# Patient Record
Sex: Female | Born: 1985 | Race: Black or African American | Hispanic: No | Marital: Single | Smoking: Never smoker
Health system: Southern US, Community
[De-identification: ages and names within clinical notes are randomized; demographics above are authoritative.]

## PROBLEM LIST (undated history)

## (undated) DIAGNOSIS — J45909 Unspecified asthma, uncomplicated: Secondary | ICD-10-CM

## (undated) DIAGNOSIS — I639 Cerebral infarction, unspecified: Secondary | ICD-10-CM

## (undated) DIAGNOSIS — D571 Sickle-cell disease without crisis: Secondary | ICD-10-CM

## (undated) HISTORY — PX: CHOLECYSTECTOMY: SHX55

## (undated) HISTORY — DX: Unspecified asthma, uncomplicated: J45.909

---

## 2013-01-24 ENCOUNTER — Encounter (HOSPITAL_COMMUNITY): Payer: Self-pay | Admitting: Hematology

## 2013-02-02 ENCOUNTER — Encounter (HOSPITAL_COMMUNITY): Payer: Self-pay | Admitting: Hematology

## 2013-02-02 ENCOUNTER — Telehealth (HOSPITAL_COMMUNITY): Payer: Self-pay | Admitting: Hematology

## 2013-02-02 ENCOUNTER — Non-Acute Institutional Stay (HOSPITAL_COMMUNITY)
Admission: AD | Admit: 2013-02-02 | Discharge: 2013-02-03 | Disposition: A | Discharge status: Home / Self Care | Payer: Medicaid Other | Source: Ambulatory Visit | Attending: Internal Medicine | Admitting: Internal Medicine

## 2013-02-02 DIAGNOSIS — I69998 Other sequelae following unspecified cerebrovascular disease: Secondary | ICD-10-CM | POA: Insufficient documentation

## 2013-02-02 DIAGNOSIS — D5700 Hb-SS disease with crisis, unspecified: Secondary | ICD-10-CM | POA: Insufficient documentation

## 2013-02-02 DIAGNOSIS — Z79899 Other long term (current) drug therapy: Secondary | ICD-10-CM | POA: Insufficient documentation

## 2013-02-02 DIAGNOSIS — H539 Unspecified visual disturbance: Secondary | ICD-10-CM | POA: Insufficient documentation

## 2013-02-02 DIAGNOSIS — M2550 Pain in unspecified joint: Secondary | ICD-10-CM | POA: Insufficient documentation

## 2013-02-02 HISTORY — DX: Cerebral infarction, unspecified: I63.9

## 2013-02-02 HISTORY — DX: Sickle-cell disease without crisis: D57.1

## 2013-02-02 LAB — COMPREHENSIVE METABOLIC PANEL
ALT: 83 U/L — ABNORMAL HIGH (ref 0–35)
AST: 110 U/L — ABNORMAL HIGH (ref 0–37)
Albumin: 3.6 g/dL (ref 3.5–5.2)
Alkaline Phosphatase: 396 U/L — ABNORMAL HIGH (ref 39–117)
Calcium: 9.2 mg/dL (ref 8.4–10.5)
Glucose, Bld: 95 mg/dL (ref 70–99)
Potassium: 3.6 mEq/L (ref 3.5–5.1)
Sodium: 139 mEq/L (ref 135–145)
Total Protein: 8.6 g/dL — ABNORMAL HIGH (ref 6.0–8.3)

## 2013-02-02 LAB — RETICULOCYTES: Retic Count, Absolute: 272.9 10*3/uL — ABNORMAL HIGH (ref 19.0–186.0)

## 2013-02-02 LAB — CBC WITH DIFFERENTIAL/PLATELET
Basophils Absolute: 0.1 10*3/uL (ref 0.0–0.1)
Basophils Relative: 2 % — ABNORMAL HIGH (ref 0–1)
Eosinophils Absolute: 0.1 10*3/uL (ref 0.0–0.7)
Hemoglobin: 12.1 g/dL (ref 12.0–15.0)
MCHC: 37.8 g/dL — ABNORMAL HIGH (ref 30.0–36.0)
Monocytes Relative: 14 % — ABNORMAL HIGH (ref 3–12)
Neutro Abs: 1.6 10*3/uL — ABNORMAL LOW (ref 1.7–7.7)
Neutrophils Relative %: 45 % (ref 43–77)
Platelets: 257 10*3/uL (ref 150–400)

## 2013-02-02 LAB — FERRITIN: Ferritin: 1178 ng/mL — ABNORMAL HIGH (ref 10–291)

## 2013-02-02 MED ORDER — SENNOSIDES-DOCUSATE SODIUM 8.6-50 MG PO TABS
1.0000 | ORAL_TABLET | Freq: Two times a day (BID) | ORAL | Status: DC
  Administered 2013-02-02: 1 via ORAL
  Filled 2013-02-02: qty 1

## 2013-02-02 MED ORDER — HYDROMORPHONE HCL PF 2 MG/ML IJ SOLN
1.0000 mg | INTRAMUSCULAR | Status: DC | PRN
  Administered 2013-02-02: 1 mg via INTRAVENOUS
  Filled 2013-02-02: qty 1

## 2013-02-02 MED ORDER — FOLIC ACID 1 MG PO TABS: 1.0000 mg | ORAL_TABLET | Freq: Every day | ORAL | Status: DC

## 2013-02-02 MED ORDER — DEXTROSE-NACL 5-0.45 % IV SOLN
INTRAVENOUS | Status: DC
  Administered 2013-02-02 (×2): via INTRAVENOUS

## 2013-02-02 MED ORDER — HYDROXYUREA 500 MG PO CAPS: 500.0000 mg | ORAL_CAPSULE | Freq: Two times a day (BID) | ORAL | Status: DC

## 2013-02-02 MED ORDER — VITAMIN D3 25 MCG (1000 UNIT) PO TABS
1000.0000 [IU] | ORAL_TABLET | Freq: Every day | ORAL | Status: DC
  Administered 2013-02-02: 1000 [IU] via ORAL
  Filled 2013-02-02 (×2): qty 1

## 2013-02-02 MED ORDER — ZOLPIDEM TARTRATE 5 MG PO TABS: 5.0000 mg | ORAL_TABLET | Freq: Every evening | ORAL | Status: DC | PRN

## 2013-02-02 MED ORDER — ONDANSETRON HCL 4 MG/2ML IJ SOLN
4.0000 mg | INTRAMUSCULAR | Status: DC | PRN
  Administered 2013-02-02: 4 mg via INTRAVENOUS
  Filled 2013-02-02: qty 2

## 2013-02-02 MED ORDER — ONDANSETRON HCL 8 MG PO TABS
4.0000 mg | ORAL_TABLET | ORAL | Status: DC | PRN
  Administered 2013-02-02 – 2013-02-03 (×2): 4 mg via ORAL
  Filled 2013-02-02 (×2): qty 1

## 2013-02-02 MED ORDER — DIPHENHYDRAMINE HCL 25 MG PO CAPS
25.0000 mg | ORAL_CAPSULE | ORAL | Status: DC | PRN
  Administered 2013-02-02: 25 mg via ORAL
  Filled 2013-02-02: qty 1

## 2013-02-02 MED ORDER — KETOROLAC TROMETHAMINE 30 MG/ML IJ SOLN
30.0000 mg | Freq: Four times a day (QID) | INTRAMUSCULAR | Status: DC
  Administered 2013-02-02 – 2013-02-03 (×3): 30 mg via INTRAVENOUS
  Filled 2013-02-02 (×3): qty 1

## 2013-02-02 MED ORDER — ONDANSETRON HCL 4 MG/2ML IJ SOLN: 2.0000 mg | Freq: Once | INTRAMUSCULAR | Status: DC

## 2013-02-02 MED ORDER — ONDANSETRON HCL 4 MG/2ML IJ SOLN
4.0000 mg | Freq: Once | INTRAMUSCULAR | Status: AC
  Administered 2013-02-02: 4 mg via INTRAVENOUS
  Filled 2013-02-02: qty 2

## 2013-02-02 MED ORDER — DIPHENHYDRAMINE HCL 50 MG/ML IJ SOLN
12.5000 mg | INTRAMUSCULAR | Status: DC | PRN
  Administered 2013-02-02 (×2): 12.5 mg via INTRAVENOUS
  Filled 2013-02-02 (×2): qty 1

## 2013-02-02 MED ORDER — HYDROMORPHONE HCL PF 2 MG/ML IJ SOLN
1.0000 mg | INTRAMUSCULAR | Status: DC
  Administered 2013-02-02 (×2): 1 mg via INTRAVENOUS
  Filled 2013-02-02 (×2): qty 1

## 2013-02-02 MED ORDER — DIPHENHYDRAMINE HCL 50 MG/ML IJ SOLN
12.5000 mg | Freq: Once | INTRAMUSCULAR | Status: AC
  Administered 2013-02-02: 12.5 mg via INTRAVENOUS
  Filled 2013-02-02: qty 1

## 2013-02-02 NOTE — H&P (Signed)
[facility] History and Physical   Date: 02/02/2013  Patient name: Joan Martin Medical record number: [id number] Date of birth: 04-05-1986 Age: 27 y.o. Gender: female PCP: MATTHEWS,Philisha Weinel A., MD  Attending physician: Altha Harm, MD  Chief Complaint:weakness and low back pain   History of Present Illness: Joan Martin  is a 27 y.o. female with history of genotype SS sickle cell disease and a PMH of a stroke at the age of 22 with vision affected no other functional decline.  She reports experiencing weakness and low back pain since Saturday and not relieved by current pain medication.  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 She denies any fevers, chills, dysuria, difficulty breathing, cough, or shortness of breath. Pain rated as 4/10 and baseline is pain free.   Meds: Prescriptions prior to admission  Medication Sig Dispense Refill  . cholecalciferol (VITAMIN D) 1000 UNITS tablet Take 1,000 Units by mouth daily.      . diphenhydrAMINE (BENADRYL) 25 MG tablet Take 25 mg by mouth every 6 (six) hours as needed for itching.      . Fluticasone-Salmeterol (ADVAIR) 250-50 MCG/DOSE AEPB Inhale 1 puff into the lungs daily.      . folic acid (FOLVITE) 1 MG tablet Take 1 mg by mouth daily.      . hydroxyurea (HYDREA) 500 MG capsule Take 500 mg by mouth 2 (two) times daily. May take with food to minimize GI side effects.      . mometasone (NASONEX) 50 MCG/ACT nasal spray Place 2 sprays into the nose daily.      Marland Kitchen oxyCODONE-acetaminophen (PERCOCET) 10-325 MG per tablet Take 1 tablet by mouth every 6 (six) hours as needed for pain.      . promethazine (PHENERGAN) 25 MG tablet Take 25 mg by mouth every 6 (six) hours as needed for nausea.        Allergies: Morphine and related Past Medical History  Diagnosis Date  . Sickle cell  anemia   . Stroke     7 years ago   Past Surgical History  Procedure Laterality Date  . Cholecystectomy     No family history on file. History   Social History  . Marital Status: Single    Spouse Name: N/A    Number of Children: N/A  . Years of Education: N/A   Occupational History  . Not on file.   Social History Main Topics  . Smoking status: Never Smoker   . Smokeless tobacco: Not on file  . Alcohol Use: No  . Drug Use: No  . Sexually Active: Yes    Birth Control/ Protection: Condom   Other Topics Concern  . Not on file   Social History Narrative  . No narrative on file    Review of Systems: Musculoskeletal:positive for back pain and weakness    Physical Exam: Blood pressure 133/77, pulse 80, temperature 98.2 F (36.8 C), temperature source Oral, resp. rate 18, height 5\' 7"  (1.702 m), weight 60.328 kg (133 lb), last menstrual period 01/12/2013, SpO2 100.00%. BP 116/70  Pulse 81  Temp(Src) 98.2 F (36.8 C) (Oral)  Resp 18  Ht 5\' 7"  (1.702 m)  Wt 60.328 kg (133 lb)  BMI 20.83 kg/m2  SpO2 95%  LMP 01/12/2013  General Appearance:    Alert, cooperative, no distress, appears stated age  Head:  Normocephalic, without obvious abnormality, atraumatic  Eyes:    PERRL, conjunctiva/corneas clear, EOM's intact, fundi    benign, both eyes  Ears:    Normal TM's and external ear canals, both ears  Nose:   Nares normal, septum midline, mucosa normal, no drainage    or sinus tenderness  Throat:   Lips, mucosa, and tongue normal; teeth and gums normal  Neck:   Supple, symmetrical, trachea midline, no adenopathy;    thyroid:  no enlargement/tenderness/nodules; no carotid   bruit or JVD  Back:     Symmetric, no curvature, ROM normal, no CVA tenderness  Lungs:     Clear to auscultation bilaterally, respirations unlabored  Chest Wall:    No tenderness or deformity   Heart:    Regular rate and rhythm, S1 and S2 normal, no murmur, rub   or gallop     Abdomen:     Soft,  non-tender, bowel sounds active all four quadrants,    no masses, no organomegaly        Extremities:   Extremities normal, atraumatic, no cyanosis or edema  Pulses:   2+ and symmetric all extremities  Skin:   Skin color, texture, turgor normal, no rashes or lesions, 1 tattoo on right foot   Lymph nodes:   Cervical, supraclavicular, and axillary nodes normal  Neurologic:   CNII-XII intact, normal strength, sensation and reflexes    throughout    Lab results: Results for orders placed during the hospital encounter of 02/02/13 (from the past 24 hour(s))  CBC WITH DIFFERENTIAL     Status: Abnormal   Collection Time    02/02/13 10:33 AM      Result Value Range   WBC 3.6 (*) 4.0 - 10.5 K/uL   RBC 2.55 (*) 3.87 - 5.11 MIL/uL   Hemoglobin 12.1  12.0 - 15.0 g/dL   HCT 16.1 (*) 09.6 - 04.5 %   MCV 125.5 (*) 78.0 - 100.0 fL   MCH 47.5 (*) 26.0 - 34.0 pg   MCHC 37.8 (*) 30.0 - 36.0 g/dL   RDW 40.9  81.1 - 91.4 %   Platelets 257  150 - 400 K/uL   Neutrophils Relative 45  43 - 77 %   Neutro Abs 1.6 (*) 1.7 - 7.7 K/uL   Lymphocytes Relative 38  12 - 46 %   Lymphs Abs 1.3  0.7 - 4.0 K/uL   Monocytes Relative 14 (*) 3 - 12 %   Monocytes Absolute 0.5  0.1 - 1.0 K/uL   Eosinophils Relative 2  0 - 5 %   Eosinophils Absolute 0.1  0.0 - 0.7 K/uL   Basophils Relative 2 (*) 0 - 1 %   Basophils Absolute 0.1  0.0 - 0.1 K/uL   WBC Morphology SICKLE CELLS    COMPREHENSIVE METABOLIC PANEL     Status: Abnormal   Collection Time    02/02/13 10:33 AM      Result Value Range   Sodium 139  135 - 145 mEq/L   Potassium 3.6  3.5 - 5.1 mEq/L   Chloride 106  96 - 112 mEq/L   CO2 20  19 - 32 mEq/L   Glucose, Bld 95  70 - 99 mg/dL   BUN 9  6 - 23 mg/dL   Creatinine, Ser 7.82  0.50 - 1.10 mg/dL   Calcium 9.2  8.4 - 95.6 mg/dL   Total Protein 8.6 (*) 6.0 - 8.3 g/dL   Albumin 3.6  3.5 - 5.2  g/dL   AST 960 (*) 0 - 37 U/L   ALT 83 (*) 0 - 35 U/L   Alkaline Phosphatase 396 (*) 39 - 117 U/L   Total Bilirubin  6.4 (*) 0.3 - 1.2 mg/dL   GFR calc non Af Amer >90  >90 mL/min   GFR calc Af Amer >90  >90 mL/min  RETICULOCYTES     Status: Abnormal   Collection Time    02/02/13 10:33 AM      Result Value Range   Retic Ct Pct 10.7 (*) 0.4 - 3.1 %   RBC. 2.55 (*) 3.87 - 5.11 MIL/uL   Retic Count, Manual 272.9 (*) 19.0 - 186.0 K/uL    Imaging results:  No results found.   Assessment & Plan:  Vaso occlusive crisis/ Pain : Chronic likely caused by hyperviscosity, sticky blood vessel and decreased oxygen carrying capacity. Treatment /plan is hydromorphone 1-2 mg Q  prn.Adjunction added Toradol 30mg  Q 6hrs for inflammatory/reactive process associated with crisis.  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 Monitor.   Laterrian Hevener P 02/02/2013, 12:31 PM

## 2013-02-02 NOTE — Telephone Encounter (Signed)
Patient called C/O "feeling sluggish and fatigued."  Denies any pain at this time.  Per patient "this is her usual feeling prior to going into crisis".  Patient is wanting to come into [facility] for IV hydration.  Advised I would discuss with NP and give her a call back.

## 2013-02-02 NOTE — Telephone Encounter (Signed)
Called patient back and advised that I had spoke with Marcelino Duster, NP, and it is ok for her to come to the [facility]  Patient verbalizes understanding

## 2013-02-02 NOTE — Progress Notes (Signed)
  Pharmacy Note - Hold Hydrea  Pt's home med, hydrea 500mg  PO BID, was reordered; however patient meets hold criteria set forth by P&T. This order will be d/c'd.   Hydroxyurea (Hydrea) hold criteria  ANC < 2  Pltc < 80K in sickle-cell patients; < 100K in other patients  Hgb <= 6 in sickle-cell patients; < 8 in other patients  Reticulocytes < 80K when Hgb < 9  Suggest rechecking these labs again to make sure hold criteria no longer met.  Darrol Angel, PharmD Pager: [phone] 02/02/2013 11:29 AM

## 2013-02-02 NOTE — Telephone Encounter (Signed)
Patient called C/O of back hurting since Saturday.  Pain has been as high as 7/10, does go dow to 4/10 after oral pain medication, but still is nagging and unbearable.  Patient also C/O of nausea, which according to patient, she has "taken something similar to phenergan, which has helped."  Advised I would discuss with the NP and give her a call back.

## 2013-02-03 MED ORDER — ONDANSETRON HCL 4 MG PO TABS: 4.0000 mg | ORAL_TABLET | Freq: Three times a day (TID) | ORAL | Status: DC | PRN

## 2013-02-03 MED ORDER — OXYCODONE HCL 5 MG PO TABS
5.0000 mg | ORAL_TABLET | Freq: Once | ORAL | Status: AC
  Administered 2013-02-03: 5 mg via ORAL
  Filled 2013-02-03: qty 1

## 2013-02-03 MED ORDER — OXYCODONE-ACETAMINOPHEN 5-325 MG PO TABS
1.0000 | ORAL_TABLET | Freq: Once | ORAL | Status: AC
  Administered 2013-02-03: 1 via ORAL
  Filled 2013-02-03: qty 1

## 2013-02-03 MED ORDER — OMEPRAZOLE 20 MG PO CPDR: 20.0000 mg | DELAYED_RELEASE_CAPSULE | Freq: Every day | ORAL | Status: DC

## 2013-02-03 MED ORDER — TRAMADOL HCL 50 MG PO TABS: 50.0000 mg | ORAL_TABLET | Freq: Three times a day (TID) | ORAL | Status: DC | PRN

## 2013-02-03 MED ORDER — OXYCODONE-ACETAMINOPHEN 10-325 MG PO TABS: 1.0000 | ORAL_TABLET | Freq: Four times a day (QID) | ORAL | Status: DC | PRN

## 2013-02-03 NOTE — Progress Notes (Signed)
Patient discharged to home. VSS. Patient in no apparent distress at this time. Discharged instructions reviewed and given, patient verbalizes understanding. Prescriptions given and called in additional prescriptions, patient verbalizes understanding. Patient exits day hospital with belongings, ambulatory. Patient verbalizes to return next Wednesday for lab draw and appointment made upon discharge for f/u visit with MD.

## 2013-02-03 NOTE — Discharge Instructions (Signed)
Sickle Cell Pain Crisis Sickle cell crisis happens when some of the red blood cells that are the wrong shape (sickle shaped instead of round) get stuck in your small blood vessels. This blocks blood flow through these vessels. HOME CARE To prevent a sickle cell crisis:  Adults should drink at least 8 glasses of liquid every day. For children, this amount should be less.  Do not drink beer, wine, or hard liquor.  Take your medicines as told by your doctor.  Keep all appointments with your doctor.  Find ways to lessen any stress you may have.  Get 8 hours of sleep every night.  Dress warmly in cold weather.  Do not go to places that are high up in the mountains.  If you cut or scrape yourself, keep your wound clean. Cover it with a bandage.  Exercise as your doctor tells you. Do not exercise so hard or so long that you sweat. GET HELP RIGHT AWAY IF:  You have bad pain.  You have a bad headache.  You have trouble breathing.  You have chest pain.  You feel weak or very tired.  You start to throw up (vomit).  You have watery poop (diarrhea).  You have a temperature by mouth above 102 F (38.9 C), not controlled by medicine.  Your baby is older than 3 months with a rectal temperature of 102 F (38.9 C) or higher.  Your baby is 33 months old or younger with a rectal temperature of 100.4 F (38 C) or higher. MAKE SURE YOU:   Understand these instructions.  Will watch this condition.  Will get help right away if you are not doing well or get worse. Document Released: 03/26/2010 Document Revised: 12/29/2011 Document Reviewed: 03/26/2010 [facility] Patient Information 2013 [place], [place].

## 2013-02-03 NOTE — Discharge Summary (Signed)
[facility] Discharge Summary   Patient ID: Joan Martin MRN: [id number] DOB/AGE: February 16, 1986 26 y.o.  Admit date: 02/02/2013 Discharge date: 02/03/2013  Primary Care Physician:  MATTHEWS,Felipe Paluch A., MD  Admission Diagnoses:  Active Problems:  Sickle cell anemia stroke   Discharge Diagnoses:    Sickle cell anemia stroke  Discharge Medications:    Medication List    ASK your doctor about these medications       cholecalciferol 1000 UNITS tablet  Commonly known as:  VITAMIN D  Take 1,000 Units by mouth daily.     diphenhydrAMINE 25 MG tablet  Commonly known as:  BENADRYL  Take 25 mg by mouth every 6 (six) hours as needed for itching.     Fluticasone-Salmeterol 250-50 MCG/DOSE Aepb  Commonly known as:  ADVAIR  Inhale 1 puff into the lungs daily.     folic acid 1 MG tablet  Commonly known as:  FOLVITE  Take 1 mg by mouth daily.     hydroxyurea 500 MG capsule  Commonly known as:  HYDREA  Take 500 mg by mouth 2 (two) times daily. May take with food to minimize GI side effects.     mometasone 50 MCG/ACT nasal spray  Commonly known as:  NASONEX  Place 2 sprays into the nose daily.     oxyCODONE-acetaminophen 10-325 MG per tablet  Commonly known as:  PERCOCET  Take 1 tablet by mouth every 6 (six) hours as needed for pain.     promethazine 25 MG tablet  Commonly known as:  PHENERGAN  Take 25 mg by mouth every 6 (six) hours as needed for nausea.         Consults: None    Significant Diagnostic Studies:  No results found.   [facility] Course:  For complete details please refer to admission H and Martin, but in brief, [name] is a 27 y.o. female with history of genotype SS sickle cell disease and a PMH of a stroke at the age of 73 with vision affected no other functional decline. She reports experiencing weakness and low back pain since Saturday and not relieved by current pain medication. Pain rated as 2/10 and  baseline is pain free. Treatment course included IV for hydration, hydromorphone and Toradol for managing VOC pain. Hydroxurea on hold from ANC  < 2 will repeat labs in 1 week. Zofran given IV x'1 than po from nausea associated with analgesic.  VOC uncomplicated.   Physical Exam at Discharge:  BP 105/61  Pulse 72  Temp(Src) 98.7 F (37.1 C) (Oral)  Resp 16  Ht 5\' 7"  (1.702 m)  Wt 60.328 kg (133 lb)  BMI 20.83 kg/m2  SpO2 98%  LMP 01/12/2013  Gen: Alert, cooperative, no distress, appears stated age  Cardiovascular: Regular rate and rhythm, S1 and S2 normal, no murmur, rub or gallop  Respiratory: Clear to auscultation bilaterally, respirations unlabored  Gastrointestinal:Soft, non-tender, bowel sounds active all four quadrants,  no masses, no organomegaly  Extremities:Extremities normal, atraumatic, no cyanosis or edema Pulses: 2+ and symmetric all extremities    Disposition at Discharge:  Home  Self care  Discharge Orders: Return for labs x's 1 week then follow up with establishing care.  Prescription given for Percocet and added tramadol both prn this was d/w Dr. Ashley Royalty and agreed upon   Hold hydrourea until seen.  RN called in prescriptions for Omeprazole 20mg  and Zofran 4mg  prn  Condition at Discharge:   Stable  Time spent  on Discharge:  Greater than 30 minutes.  Signed: Ica Daye Martin 02/03/2013, 9:04 AM

## 2013-02-10 NOTE — Discharge Summary (Signed)
Pt seen and examined and discussed with NP Gwinda Passe. Pt found to have a low ANC and on Hydrea. She has been advised to hold Hydrea and return to clinic forCBC with diff on 02/10/2013. Agree with assessment and plan.

## 2013-02-10 NOTE — H&P (Signed)
Pt seen and examined and discussed with NP Michelle Edwards. Agree with assessment and plan. 

## 2013-02-14 ENCOUNTER — Non-Acute Institutional Stay (HOSPITAL_COMMUNITY)
Admission: AD | Admit: 2013-02-14 | Discharge: 2013-02-15 | Disposition: A | Discharge status: Home / Self Care | Payer: Medicaid Other | Source: Ambulatory Visit | Attending: Internal Medicine | Admitting: Internal Medicine

## 2013-02-14 ENCOUNTER — Encounter (HOSPITAL_COMMUNITY): Payer: Self-pay | Admitting: Hematology

## 2013-02-14 DIAGNOSIS — D5700 Hb-SS disease with crisis, unspecified: Secondary | ICD-10-CM | POA: Insufficient documentation

## 2013-02-14 DIAGNOSIS — Z8673 Personal history of transient ischemic attack (TIA), and cerebral infarction without residual deficits: Secondary | ICD-10-CM | POA: Insufficient documentation

## 2013-02-14 DIAGNOSIS — D57819 Other sickle-cell disorders with crisis, unspecified: Secondary | ICD-10-CM

## 2013-02-14 DIAGNOSIS — R52 Pain, unspecified: Secondary | ICD-10-CM | POA: Insufficient documentation

## 2013-02-14 DIAGNOSIS — Z79899 Other long term (current) drug therapy: Secondary | ICD-10-CM | POA: Insufficient documentation

## 2013-02-14 DIAGNOSIS — Z9109 Other allergy status, other than to drugs and biological substances: Secondary | ICD-10-CM | POA: Insufficient documentation

## 2013-02-14 DIAGNOSIS — Z885 Allergy status to narcotic agent status: Secondary | ICD-10-CM | POA: Insufficient documentation

## 2013-02-14 LAB — CBC WITH DIFFERENTIAL/PLATELET
Basophils Absolute: 0.2 10*3/uL — ABNORMAL HIGH (ref 0.0–0.1)
HCT: 32.5 % — ABNORMAL LOW (ref 36.0–46.0)
Lymphocytes Relative: 40 % (ref 12–46)
Monocytes Relative: 15 % — ABNORMAL HIGH (ref 3–12)
Neutrophils Relative %: 39 % — ABNORMAL LOW (ref 43–77)
Platelets: 393 10*3/uL (ref 150–400)
RDW: 16.1 % — ABNORMAL HIGH (ref 11.5–15.5)
WBC: 6.4 10*3/uL (ref 4.0–10.5)

## 2013-02-14 LAB — RETICULOCYTES
RBC.: 2.35 MIL/uL — ABNORMAL LOW (ref 3.87–5.11)
Retic Count, Absolute: 329 10*3/uL — ABNORMAL HIGH (ref 19.0–186.0)
Retic Ct Pct: 14 % — ABNORMAL HIGH (ref 0.4–3.1)

## 2013-02-14 LAB — PREPARE RBC (CROSSMATCH)

## 2013-02-14 LAB — ABO/RH: ABO/RH(D): O POS

## 2013-02-14 MED ORDER — FLUTICASONE PROPIONATE 50 MCG/ACT NA SUSP
1.0000 | Freq: Every day | NASAL | Status: DC
  Filled 2013-02-14: qty 16

## 2013-02-14 MED ORDER — VITAMIN D3 25 MCG (1000 UNIT) PO TABS
1000.0000 [IU] | ORAL_TABLET | Freq: Every day | ORAL | Status: DC
  Administered 2013-02-14: 1000 [IU] via ORAL
  Filled 2013-02-14 (×2): qty 1

## 2013-02-14 MED ORDER — PROMETHAZINE HCL 25 MG PO TABS: 25.0000 mg | ORAL_TABLET | Freq: Four times a day (QID) | ORAL | Status: DC | PRN

## 2013-02-14 MED ORDER — HYDROMORPHONE HCL PF 2 MG/ML IJ SOLN
2.0000 mg | INTRAMUSCULAR | Status: DC | PRN
  Administered 2013-02-14 (×2): 2 mg via INTRAVENOUS
  Filled 2013-02-14 (×2): qty 1

## 2013-02-14 MED ORDER — FOLIC ACID 1 MG PO TABS: 1.0000 mg | ORAL_TABLET | Freq: Every day | ORAL | Status: DC

## 2013-02-14 MED ORDER — MOMETASONE FURO-FORMOTEROL FUM 100-5 MCG/ACT IN AERO
2.0000 | INHALATION_SPRAY | Freq: Two times a day (BID) | RESPIRATORY_TRACT | Status: DC
  Administered 2013-02-14: 2 via RESPIRATORY_TRACT
  Filled 2013-02-14: qty 8.8

## 2013-02-14 MED ORDER — DIPHENHYDRAMINE HCL 25 MG PO CAPS
25.0000 mg | ORAL_CAPSULE | ORAL | Status: DC | PRN
  Administered 2013-02-15: 25 mg via ORAL
  Filled 2013-02-14: qty 1

## 2013-02-14 MED ORDER — PANTOPRAZOLE SODIUM 40 MG PO TBEC
40.0000 mg | DELAYED_RELEASE_TABLET | Freq: Every day | ORAL | Status: DC
  Administered 2013-02-14: 40 mg via ORAL
  Filled 2013-02-14: qty 1

## 2013-02-14 MED ORDER — SENNOSIDES-DOCUSATE SODIUM 8.6-50 MG PO TABS
1.0000 | ORAL_TABLET | Freq: Two times a day (BID) | ORAL | Status: DC
  Administered 2013-02-14 (×2): 1 via ORAL
  Filled 2013-02-14 (×2): qty 1

## 2013-02-14 MED ORDER — ONDANSETRON HCL 4 MG/2ML IJ SOLN
4.0000 mg | INTRAMUSCULAR | Status: DC | PRN
  Administered 2013-02-14: 4 mg via INTRAVENOUS
  Filled 2013-02-14: qty 2

## 2013-02-14 MED ORDER — ONDANSETRON HCL 8 MG PO TABS: 4.0000 mg | ORAL_TABLET | ORAL | Status: DC | PRN

## 2013-02-14 MED ORDER — KETOROLAC TROMETHAMINE 30 MG/ML IJ SOLN
30.0000 mg | Freq: Four times a day (QID) | INTRAMUSCULAR | Status: DC
  Administered 2013-02-14 – 2013-02-15 (×3): 30 mg via INTRAVENOUS
  Filled 2013-02-14 (×3): qty 1

## 2013-02-14 MED ORDER — DEXTROSE-NACL 5-0.45 % IV SOLN
INTRAVENOUS | Status: DC
  Administered 2013-02-14: 100 mL/h via INTRAVENOUS
  Administered 2013-02-15: via INTRAVENOUS

## 2013-02-14 MED ORDER — SODIUM CHLORIDE 0.9 % IV SOLN
25.0000 mg | INTRAVENOUS | Status: DC | PRN
  Administered 2013-02-14 (×2): 25 mg via INTRAVENOUS
  Filled 2013-02-14 (×2): qty 0.5

## 2013-02-14 NOTE — Progress Notes (Signed)
IV team paged for midline IV access

## 2013-02-14 NOTE — Progress Notes (Signed)
Received report from day nurse Tiffany, RN.

## 2013-02-14 NOTE — H&P (Signed)
Hospital Admission Note Date: 02/14/2013  Patient name: Joan Martin Medical record number: [id number] Date of birth: [birth year] Age: 27 y.o. Gender: female PCP: MATTHEWS,MICHELLE A., MD  Attending physician: Altha Harm, MD  Chief Complaint:Pain not controlled by oral analgesics.  History of Present Illness: Joan Martin is a young lady with Hb SS who is very active and rarely is hospitalized. She has done very well on Hydrea in maintaining her function. However in the last 2 weeks Hydrea had to be held because of low ANC. Since then she has had increasing pain to the point that it has impeded her function and she was unable to go to work today.  Pt denies fever, chills, N/V/D. She is tearful and states that she does not want to be admitted to the hospital.   Scheduled Meds: . cholecalciferol  1,000 Units Oral Daily  . [START ON 02/15/2013] fluticasone  1 spray Each Nare Daily  . [START ON 02/15/2013] folic acid  1 mg Oral Daily  . ketorolac  30 mg Intravenous Q6H  . mometasone-formoterol  2 puff Inhalation BID  . pantoprazole  40 mg Oral Daily  . senna-docusate  1 tablet Oral BID   Continuous Infusions: . dextrose 5 % and 0.45% NaCl 100 mL/hr (02/14/13 1456)   PRN Meds:.diphenhydrAMINE (BENADRYL) IVPB(SICKLE CELL ONLY), diphenhydrAMINE, HYDROmorphone (DILAUDID) injection, ondansetron (ZOFRAN) IV, ondansetron, promethazine Allergies: Citrus and Morphine and related Past Medical History  Diagnosis Date  . Sickle cell anemia   . Stroke     7 years ago   Past Surgical History  Procedure Laterality Date  . Cholecystectomy     No family history on file. History   Social History  . Marital Status: Single    Spouse Name: N/A    Number of Children: N/A  . Years of Education: N/A   Occupational History  . Not on file.   Social History Main Topics  . Smoking status: Never Smoker   . Smokeless tobacco: Not on file  . Alcohol Use: No  . Drug Use: No  . Sexually Active: Yes     Birth Control/ Protection: Condom   Other Topics Concern  . Not on file   Social History Narrative  . No narrative on file   Review of Systems: A comprehensive review of systems was negative except as noted in the HPI.  Physical Exam: General: Alert, awake, oriented x3, in moderately acute distress.  HEENT: [place]/AT PEERL, EOMI, anicteric. Neck: Trachea midline,  no masses, no thyromegal,y no JVD, no carotid bruit  Heart: Regular rate and rhythm, without murmurs, rubs, gallops.  Lungs: Clear to auscultation, no wheezing or rhonchi noted.  Abdomen: Soft, nontender, nondistended, positive bowel sounds, no masses no hepatosplenomegaly noted..  Neuro: No focal neurological deficits noted cranial nerves II through XII grossly intact. Strength normal in bilateral upper and lower extremities. Musculoskeletal: No warm swelling or erythema around joints. Psychiatric: Patient alert and oriented x3, good insight and cognition, good recent to remote recall.   Lab results: No results found for this basename: NA, K, CL, CO2, GLUCOSE, BUN, CREATININE, CALCIUM, MG, PHOS,  in the last 72 hours No results found for this basename: AST, ALT, ALKPHOS, BILITOT, PROT, ALBUMIN,  in the last 72 hours No results found for this basename: LIPASE, AMYLASE,  in the last 72 hours  Recent Labs  02/14/13 1446  WBC 6.4  NEUTROABS 2.5  HGB 11.1*  HCT 32.5*  MCV 138.3*  PLT 393   No results found  for this basename: CKTOTAL, CKMB, CKMBINDEX, TROPONINI,  in the last 72 hours No components found with this basename: POCBNP,  No results found for this basename: DDIMER,  in the last 72 hours No results found for this basename: HGBA1C,  in the last 72 hours No results found for this basename: CHOL, HDL, LDLCALC, TRIG, CHOLHDL, LDLDIRECT,  in the last 72 hours No results found for this basename: TSH, T4TOTAL, FREET3, T3FREE, THYROIDAB,  in the last 72 hours  Recent Labs  02/14/13 1446  RETICCTPCT 14.0*    Imaging results:  No results found. Other results:  Hb SS with Acute VOC:  Pt with Hb SS who is usually maintained on Hydrea but has been off due to low ANC. She has had difficulty with pain since having to hold her Hydrea. Plan is to hydrate and treat aggressively with IV hydration, IV Dilaudid and Toradol. Will also do an exchange transfusion as second lone treatment for persistent pain despite optimized pain medications. ANC now within normal limits so will resume Hydrea at discharge.  Re-evaluate after exchange transfusion.  MATTHEWS,MICHELLE A. 02/14/2013, 4:54 PM

## 2013-02-14 NOTE — Progress Notes (Signed)
Blood informed consent signed. Pt verbalized understanding. Pt denies any further questions.

## 2013-02-15 ENCOUNTER — Telehealth (HOSPITAL_COMMUNITY): Payer: Self-pay | Admitting: *Deleted

## 2013-02-15 LAB — CBC WITH DIFFERENTIAL/PLATELET
Basophils Absolute: 0.2 10*3/uL — ABNORMAL HIGH (ref 0.0–0.1)
Eosinophils Absolute: 0.2 10*3/uL (ref 0.0–0.7)
HCT: 29.5 % — ABNORMAL LOW (ref 36.0–46.0)
Lymphs Abs: 2.4 10*3/uL (ref 0.7–4.0)
MCH: 42.4 pg — ABNORMAL HIGH (ref 26.0–34.0)
MCHC: 36.6 g/dL — ABNORMAL HIGH (ref 30.0–36.0)
MCV: 115.7 fL — ABNORMAL HIGH (ref 78.0–100.0)
Neutro Abs: 2.3 10*3/uL (ref 1.7–7.7)
RDW: 18.4 % — ABNORMAL HIGH (ref 11.5–15.5)

## 2013-02-15 LAB — TYPE AND SCREEN

## 2013-02-15 MED ORDER — OXYCODONE HCL 5 MG PO TABS: 5.0000 mg | ORAL_TABLET | Freq: Four times a day (QID) | ORAL | Status: DC | PRN

## 2013-02-15 MED ORDER — OXYCODONE-ACETAMINOPHEN 5-325 MG PO TABS: 1.0000 | ORAL_TABLET | Freq: Four times a day (QID) | ORAL | Status: DC | PRN

## 2013-02-15 NOTE — Progress Notes (Signed)
Patient discharged home. VSS. Patient in no apparent distress at this time. Discharge instructions reviewed. Patient reports to come to office next week for f/u labs per MD instructions. Patient exit clinic with belongings ambulatory via self.

## 2013-02-15 NOTE — Telephone Encounter (Signed)
Message left for patient to return call.

## 2013-02-15 NOTE — Telephone Encounter (Signed)
Instructed patient to restart taking hydroxyurea as was previously taking at home per Dr. Ashley Royalty.

## 2013-02-15 NOTE — Discharge Summary (Signed)
Joan Martin MRN: [id number] DOB/AGE: 04/29/1986 27 y.o.  Admit date: 02/14/2013 Discharge date: 02/15/2013  Primary Care Physician:  Kimetha Trulson A., MD   Discharge Diagnoses:   There are no active problems to display for this patient.   DISCHARGE MEDICATION:   Medication List    TAKE these medications       cholecalciferol 1000 UNITS tablet  Commonly known as:  VITAMIN D  Take 1,000 Units by mouth daily.     diphenhydrAMINE 25 MG tablet  Commonly known as:  BENADRYL  Take 25 mg by mouth every 6 (six) hours as needed for itching.     Fluticasone-Salmeterol 250-50 MCG/DOSE Aepb  Commonly known as:  ADVAIR  Inhale 1 puff into the lungs daily.     folic acid 1 MG tablet  Commonly known as:  FOLVITE  Take 1 mg by mouth daily.  hydroxyurea 500 mg tablet  Commonly known as Hydrea  Take 2 tablets (1000 mg total) by mouth daily     mometasone 50 MCG/ACT nasal spray  Commonly known as:  NASONEX  Place 2 sprays into the nose daily.     omeprazole 20 MG capsule  Commonly known as:  PRILOSEC  Take 1 capsule (20 mg total) by mouth daily.     ondansetron 4 MG tablet  Commonly known as:  ZOFRAN  Take 1 tablet (4 mg total) by mouth every 8 (eight) hours as needed for nausea.     oxyCODONE-acetaminophen 10-325 MG per tablet  Commonly known as:  PERCOCET  Take 1 tablet by mouth every 6 (six) hours as needed for pain.     promethazine 25 MG tablet  Commonly known as:  PHENERGAN  Take 25 mg by mouth every 6 (six) hours as needed for nausea.     traMADol 50 MG tablet  Commonly known as:  ULTRAM  Take 1 tablet (50 mg total) by mouth every 8 (eight) hours as needed for pain.          Consults:     No results found for this or any previous visit (from the past 240 hour(s)).  BRIEF ADMITTING H & P: [name] is a young lady with Hb SS who is very active and rarely is hospitalized. She has done very well on Hydrea in maintaining her function. However in the last 2  weeks Hydrea had to be held because of low ANC. Since then she has had increasing pain to the point that it has impeded her function and she was unable to go to work today. Pt denies fever, chills, N/V/D. She is tearful and states that she does not want to be admitted to the hospital.   Hospital Course:  Present on Admission: Pt was admitted to day hospital and treated with IV dilaudid for 4 doses. She was resumed on her oral medications after she received and exchange transfusion of 250 ml which she tolerated without difficulty. The patient has been off of her Hydrea for 2 weeks due to low ANC. She is usually very well controlled when on her Hydrea. Thus an exchange transfusion was given as a second line of treatment. She was resumed on her usual home medications including her Hydrea as her ANC has recovered to 2300.     Disposition and Follow-up:  Pt was discharged in stable condition and she is to follow up in the clinic in 2 weeks and we will perform a Hb electrophoresis at that time.     Discharge Orders  Future Orders Complete By Expires     Activity as tolerated - No restrictions  As directed     Diet general  As directed        DISCHARGE EXAM:  General: Alert, awake, oriented x3, in no distress. Well appearing. Vital Signs:BP 103/76, HR 60, T 98.1 F (36.7 C), temperature source Oral, RR14, height 5\' 7"  (1.702 m), weight 58.968 kg (130 lb), last menstrual period 02/05/2013, SpO2 98.00%. HEENT: [place]/AT PEERL, EOMI, anicteric.  Neck: Trachea midline, no masses, no thyromegal,y no JVD, no carotid bruit  Heart: Regular rate and rhythm, without murmurs, rubs, gallops.  Lungs: Clear to auscultation, no wheezing or rhonchi noted.  Abdomen: Soft, nontender, nondistended, positive bowel sounds, no masses no hepatosplenomegaly noted..  Neuro: No focal neurological deficits noted cranial nerves II through XII grossly intact. Strength normal in bilateral upper and lower extremities.   Musculoskeletal: No warm swelling or erythema around joints.  Psychiatric: Patient alert and oriented x3, good insight and cognition, good recent to remote recall.    Recent Labs  02/14/13 1446  WBC 6.4  NEUTROABS 2.5  HGB 11.1*  HCT 32.5*  MCV 138.3*  PLT 393   Total time for discharge process including decision making and face to face time is greater than 30 minutes.  Signed: Caryle Helgeson A. 02/15/2013, 7:57 AM

## 2013-02-24 ENCOUNTER — Other Ambulatory Visit: Payer: Self-pay | Admitting: Internal Medicine

## 2013-02-25 ENCOUNTER — Encounter: Payer: Self-pay | Admitting: *Deleted

## 2013-02-25 LAB — CBC WITH DIFFERENTIAL/PLATELET
Eosinophils Absolute: 0.3 10*3/uL (ref 0.0–0.7)
Eosinophils Relative: 4 % (ref 0–5)
Lymphs Abs: 2.8 10*3/uL (ref 0.7–4.0)
MCH: 41.8 pg — ABNORMAL HIGH (ref 26.0–34.0)
MCV: 117.9 fL — ABNORMAL HIGH (ref 78.0–100.0)
Monocytes Absolute: 0.7 10*3/uL (ref 0.1–1.0)
Platelets: 256 10*3/uL (ref 150–400)
RBC: 2.63 MIL/uL — ABNORMAL LOW (ref 3.87–5.11)
RDW: 17.8 % — ABNORMAL HIGH (ref 11.5–15.5)

## 2013-02-27 ENCOUNTER — Emergency Department (HOSPITAL_COMMUNITY)
Admission: EM | Admit: 2013-02-27 | Discharge: 2013-02-27 | Disposition: A | Discharge status: Home / Self Care | Payer: Medicaid Other | Attending: Emergency Medicine | Admitting: Emergency Medicine

## 2013-02-27 ENCOUNTER — Encounter (HOSPITAL_COMMUNITY): Payer: Self-pay | Admitting: *Deleted

## 2013-02-27 DIAGNOSIS — IMO0002 Reserved for concepts with insufficient information to code with codable children: Secondary | ICD-10-CM | POA: Insufficient documentation

## 2013-02-27 DIAGNOSIS — H109 Unspecified conjunctivitis: Secondary | ICD-10-CM | POA: Insufficient documentation

## 2013-02-27 DIAGNOSIS — Z79899 Other long term (current) drug therapy: Secondary | ICD-10-CM | POA: Insufficient documentation

## 2013-02-27 DIAGNOSIS — Z8673 Personal history of transient ischemic attack (TIA), and cerebral infarction without residual deficits: Secondary | ICD-10-CM | POA: Insufficient documentation

## 2013-02-27 DIAGNOSIS — J309 Allergic rhinitis, unspecified: Secondary | ICD-10-CM | POA: Insufficient documentation

## 2013-02-27 DIAGNOSIS — D571 Sickle-cell disease without crisis: Secondary | ICD-10-CM | POA: Insufficient documentation

## 2013-02-27 MED ORDER — ERYTHROMYCIN 5 MG/GM OP OINT: TOPICAL_OINTMENT | OPHTHALMIC | Status: DC

## 2013-02-27 NOTE — ED Notes (Signed)
Pt presents to ed with c/o itchy, red eyes. 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 Pt sts has allergies and is not sure if this is causing her eye problems.

## 2013-02-27 NOTE — ED Provider Notes (Signed)
Medical screening examination/treatment/procedure(s) were performed by non-physician practitioner and as supervising physician I was immediately available for consultation/collaboration.  Donnetta Hutching, MD 02/27/13 [phone]

## 2013-02-27 NOTE — ED Provider Notes (Signed)
History     CSN: [id number]  Arrival date & time 02/27/13  4098   First MD Initiated Contact with Patient 02/27/13 0739      No chief complaint on file.   (Consider location/radiation/quality/duration/timing/severity/associated sxs/prior treatment) HPI Comments: Vision is a 27 year old female who presents today with left eye redness and itching for the past 2 days. She states her symptoms have worsened. This morning when she woke up there was a thick discharge coming from her left eye and she has used her fingers to open her eye. She has also noticed some itching of her right eye and some watery discharge coming from both. She has used eye rewetting drops without relief. She admits to allergies and has not been taking allergy medication recently. She denies vision changes, floaters, double vision, blurry vision, fever, chills, rhinorrhea, congestion, ear pain, sore throat, nausea, vomiting, abdominal pain, headache. She has history of sickle cell disease. 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   The history is provided by the patient. No language interpreter was used.    Past Medical History  Diagnosis Date  . Sickle cell anemia   . Stroke     7 years ago    Past Surgical History  Procedure Laterality Date  . Cholecystectomy      No family history on file.  History  Substance Use Topics  . Smoking status: Never Smoker   . Smokeless tobacco: Not on file  . Alcohol Use: No    OB History   Grav Para Term Preterm Abortions TAB SAB Ect Mult Living                  Review of Systems  Constitutional: Negative for fever and chills.  Eyes: Positive for pain, discharge, redness and itching. Negative for photophobia and visual disturbance.  Gastrointestinal: Negative for nausea and vomiting.  All other systems reviewed and are negative.    Allergies  Cephalosporins; Citrus; and Morphine and related  Home Medications   Current Outpatient Rx  Name  Route  Sig   Dispense  Refill  . cholecalciferol (VITAMIN D) 1000 UNITS tablet   Oral   Take 1,000 Units by mouth daily.         . diphenhydrAMINE (BENADRYL) 25 MG tablet   Oral   Take 25 mg by mouth every 6 (six) hours as needed for itching.         . Fluticasone-Salmeterol (ADVAIR) 250-50 MCG/DOSE AEPB   Inhalation   Inhale 1 puff into the lungs daily.         . folic acid (FOLVITE) 1 MG tablet   Oral   Take 1 mg by mouth daily.         . mometasone (NASONEX) 50 MCG/ACT nasal spray   Nasal   Place 2 sprays into the nose daily.         Marland Kitchen omeprazole (PRILOSEC) 20 MG capsule   Oral   Take 1 capsule (20 mg total) by mouth daily.   30 capsule        VO called in   . ondansetron (ZOFRAN) 4 MG tablet   Oral   Take 1 tablet (4 mg total) by mouth every 8 (eight) hours as needed for nausea.   15 tablet   0     VO given called in   . oxyCODONE-acetaminophen (PERCOCET) 10-325 MG per tablet   Oral   Take 1 tablet by mouth every 6 (six) hours as needed  for pain.   30 tablet   0   . promethazine (PHENERGAN) 25 MG tablet   Oral   Take 25 mg by mouth every 6 (six) hours as needed for nausea.         . traMADol (ULTRAM) 50 MG tablet   Oral   Take 1 tablet (50 mg total) by mouth every 8 (eight) hours as needed for pain.   15 tablet   0     BP 116/71  Pulse 99  Temp(Src) 98.9 F (37.2 C) (Oral)  Resp 16  Ht 5\' 7"  (1.702 m)  Wt 130 lb (58.968 kg)  BMI 20.36 kg/m2  SpO2 99%  LMP 02/05/2013  Physical Exam  Nursing note and vitals reviewed. Constitutional: She is oriented to person, place, and time. She appears well-developed and well-nourished. No distress.  HENT:  Head: Normocephalic and atraumatic.  Right Ear: External ear normal.  Left Ear: External ear normal.  Nose: Nose normal.  Mouth/Throat: Oropharynx is clear and moist.  Eyes: EOM are normal. Pupils are equal, round, and reactive to light. Right eye exhibits no discharge. Left eye exhibits discharge.  Right conjunctiva is injected. Left conjunctiva is injected.  Left conjunctival injection > right  Neck: Normal range of motion.  Cardiovascular: Normal rate, regular rhythm and normal heart sounds.   Pulmonary/Chest: Effort normal and breath sounds normal. No stridor. No respiratory distress. She has no wheezes. She has no rales.  Abdominal: Soft. She exhibits no distension.  Musculoskeletal: Normal range of motion.  Neurological: She is alert and oriented to person, place, and time. She has normal strength.  Skin: Skin is warm and dry. She is not diaphoretic. No erythema.  Psychiatric: She has a normal mood and affect. Her behavior is normal.    ED Course  Procedures (including critical care time)  Labs Reviewed - No data to display No results found.   1. Conjunctivitis       MDM  Patient presents with eye pain, redness. She works with children. No photophobia, double vision, floaters, or blurry vision. She will be sent home with erythromycin ointment. He may stay home from work today. Return instructions given. Vital signs stable for discharge. Patient / Family / Caregiver informed of clinical course, understand medical decision-making process, and agree with plan.         Mora Bellman, PA-C 02/27/13 [phone]

## 2013-02-28 LAB — HEMOGLOBINOPATHY EVALUATION: Hgb S Quant: 50.1 % — ABNORMAL HIGH

## 2013-04-04 ENCOUNTER — Ambulatory Visit (INDEPENDENT_AMBULATORY_CARE_PROVIDER_SITE_OTHER): Payer: Medicaid Other | Admitting: Internal Medicine

## 2013-04-04 ENCOUNTER — Ambulatory Visit (HOSPITAL_COMMUNITY)
Admission: AD | Admit: 2013-04-04 | Discharge: 2013-04-04 | Disposition: A | Discharge status: Home / Self Care | Payer: Medicaid Other | Source: Ambulatory Visit | Attending: Internal Medicine | Admitting: Internal Medicine

## 2013-04-04 ENCOUNTER — Encounter: Payer: Self-pay | Admitting: Internal Medicine

## 2013-04-04 VITALS — BP 135/84 | HR 106 | Temp 98.3°F | Resp 18 | Ht 67.0 in | Wt 132.0 lb

## 2013-04-04 DIAGNOSIS — D571 Sickle-cell disease without crisis: Secondary | ICD-10-CM | POA: Insufficient documentation

## 2013-04-04 DIAGNOSIS — M549 Dorsalgia, unspecified: Secondary | ICD-10-CM | POA: Insufficient documentation

## 2013-04-04 DIAGNOSIS — Z79899 Other long term (current) drug therapy: Secondary | ICD-10-CM | POA: Insufficient documentation

## 2013-04-04 DIAGNOSIS — D5700 Hb-SS disease with crisis, unspecified: Secondary | ICD-10-CM

## 2013-04-04 DIAGNOSIS — R17 Unspecified jaundice: Secondary | ICD-10-CM | POA: Insufficient documentation

## 2013-04-04 DIAGNOSIS — R110 Nausea: Secondary | ICD-10-CM | POA: Insufficient documentation

## 2013-04-04 LAB — CBC WITH DIFFERENTIAL/PLATELET
Basophils Absolute: 0 10*3/uL (ref 0.0–0.1)
Basophils Relative: 0 % (ref 0–1)
Eosinophils Absolute: 0.2 10*3/uL (ref 0.0–0.7)
Eosinophils Relative: 3 % (ref 0–5)
Hemoglobin: 10.5 g/dL — ABNORMAL LOW (ref 12.0–15.0)
Lymphocytes Relative: 35 % (ref 12–46)
Lymphs Abs: 1.9 10*3/uL (ref 0.7–4.0)
MCH: 40.1 pg — ABNORMAL HIGH (ref 26.0–34.0)
Monocytes Absolute: 0.8 10*3/uL (ref 0.1–1.0)
Monocytes Relative: 14 % — ABNORMAL HIGH (ref 3–12)
Myelocytes: 0 %
Neutro Abs: 2.6 10*3/uL (ref 1.7–7.7)
Neutrophils Relative %: 48 % (ref 43–77)
RBC: 2.62 MIL/uL — ABNORMAL LOW (ref 3.87–5.11)
WBC: 5.5 10*3/uL (ref 4.0–10.5)
nRBC: 0 /100 WBC

## 2013-04-04 LAB — COMPREHENSIVE METABOLIC PANEL
ALT: 49 U/L — ABNORMAL HIGH (ref 0–35)
Alkaline Phosphatase: 396 U/L — ABNORMAL HIGH (ref 39–117)
BUN: 7 mg/dL (ref 6–23)
CO2: 21 mEq/L (ref 19–32)
Chloride: 104 mEq/L (ref 96–112)
GFR calc Af Amer: >90 mL/min (ref >90)
Glucose, Bld: 108 mg/dL — ABNORMAL HIGH (ref 70–99)
Potassium: 3.7 mEq/L (ref 3.5–5.1)
Sodium: 137 mEq/L (ref 135–145)
Total Bilirubin: 7.8 mg/dL — ABNORMAL HIGH (ref 0.3–1.2)

## 2013-04-04 NOTE — Procedures (Signed)
[facility]  Procedure Note  Joan Martin WUJ:[id number] DOB: 16-Feb-1986 DOA: 04/04/2013   PCP: MATTHEWS,MICHELLE A., MD   Associated Diagnosis: Sickle Cell Anemia without crisis   Procedure Note: Lab draw from peripheral site   Condition During Procedure: Tolerated well; no complications noted   Condition at Discharge: No complaints   Katrinka Blazing, Joslyn Hy, RN  [facility]

## 2013-04-04 NOTE — Progress Notes (Signed)
  Subjective:    Patient ID: Joan Martin, female    DOB: 1985/12/23, 27 y.o.   MRN: [id number]  HPI: Pt states that she has been having pain in her back on a daily basis enough to require 1-2 percocet daily. She states that the pain has not limitid her function. She has been taking hydrea and folic acid without any difficulty.  No fevers/chils/nausea/vomiting/diarrhea. However she has had mild nausea tha still persists. She has noted no difference with the use of Prilosec.    Review of Systems  Constitutional: Negative.   HENT: Negative.   Eyes: Negative.   Respiratory: Negative.   Cardiovascular: Negative.   Gastrointestinal: Positive for nausea (occasional nausea).  Endocrine: Negative.   Musculoskeletal: Positive for back pain (2-3/10).  Skin: Negative.   Allergic/Immunologic: Negative.   Neurological: Negative.   Hematological: Negative.   Psychiatric/Behavioral: Negative.        Objective:   Physical Exam  Constitutional: She is oriented to person, place, and time. She appears well-developed and well-nourished. No distress.  HENT:  Head: Normocephalic and atraumatic.  Eyes: Conjunctivae and EOM are normal. Pupils are equal, round, and reactive to light. Scleral icterus (mild icterus) is present.  Neck: Normal range of motion. Neck supple. No JVD present. No tracheal deviation present. No thyromegaly present.  Cardiovascular: Normal rate, regular rhythm and normal heart sounds.  Exam reveals no gallop and no friction rub.   No murmur heard. Pulmonary/Chest: Effort normal and breath sounds normal. No respiratory distress. She has no wheezes. She has no rales.  Abdominal: Soft. She exhibits no distension and no mass. There is no tenderness.  Musculoskeletal: Normal range of motion. She exhibits no edema and no tenderness.  Lymphadenopathy:    She has no cervical adenopathy.  Neurological: She is alert and oriented to person, place, and time. She has normal reflexes. No  cranial nerve deficit.  Skin: Skin is warm and dry.  Psychiatric: She has a normal mood and affect. Her behavior is normal. Judgment and thought content normal.          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 Please note that this is an increase in requirement for pain medications the patient previously had had multiple days where she had no pain. She had been without her hydroxyurea was restarted that. This may have contributed to the increased pain that she has. At this time I will not change her therapeutic plan but rather continue to observe her as she continues with her hydroxyurea to the evaluate whether she will return to her previous baseline.   A review of labs from last month shows good Hb F induction with Hydrea and good indices to support continuing Hydrea. I will continue her hydroxyurea at the current dose evaluate her CBC with differential today to determine continued administration of hydroxyurea. Patient needs an echocardiogram if she has not had one within the last 5 years. She also is in need of her ophthalmology examination. She states that she will F/U with Hematologist in [place] at end of July.  Nausea: No improvement with trial of Prilosec. Will re-evaluate on next visit an dif persisting will refer for barium swallow in light of NSAID use.  Labs: CBC with differential, CMET, reticulocyte. RTC: One month Greater than 50% of time spent in discussion and counseling regarding sickle cell disease.

## 2013-05-09 ENCOUNTER — Ambulatory Visit: Payer: Medicaid Other | Admitting: Internal Medicine

## 2013-05-09 ENCOUNTER — Other Ambulatory Visit: Payer: Medicaid Other | Admitting: *Deleted

## 2013-05-09 DIAGNOSIS — D5700 Hb-SS disease with crisis, unspecified: Secondary | ICD-10-CM

## 2013-05-09 LAB — COMPREHENSIVE METABOLIC PANEL
ALT: 41 U/L — ABNORMAL HIGH (ref 0–35)
AST: 92 U/L — ABNORMAL HIGH (ref 0–37)
Alkaline Phosphatase: 364 U/L — ABNORMAL HIGH (ref 39–117)
BUN: 9 mg/dL (ref 6–23)
Calcium: 9 mg/dL (ref 8.4–10.5)
Chloride: 106 mEq/L (ref 96–112)
Creat: 0.82 mg/dL (ref 0.50–1.10)

## 2013-05-10 ENCOUNTER — Ambulatory Visit (HOSPITAL_COMMUNITY)
Admission: AD | Admit: 2013-05-10 | Discharge: 2013-05-10 | Disposition: A | Discharge status: Home / Self Care | Payer: Medicaid Other | Source: Ambulatory Visit | Attending: Internal Medicine | Admitting: Internal Medicine

## 2013-05-10 ENCOUNTER — Encounter: Payer: Self-pay | Admitting: Primary Care

## 2013-05-10 ENCOUNTER — Ambulatory Visit (INDEPENDENT_AMBULATORY_CARE_PROVIDER_SITE_OTHER): Payer: Medicaid Other | Admitting: Primary Care

## 2013-05-10 VITALS — BP 119/64 | HR 77 | Temp 98.3°F | Wt 128.0 lb

## 2013-05-10 DIAGNOSIS — D5700 Hb-SS disease with crisis, unspecified: Secondary | ICD-10-CM

## 2013-05-10 LAB — CBC WITH DIFFERENTIAL/PLATELET
Basophils Absolute: 0.2 10*3/uL — ABNORMAL HIGH (ref 0.0–0.1)
Basophils Relative: 3 % — ABNORMAL HIGH (ref 0–1)
Eosinophils Relative: 1 % (ref 0–5)
HCT: 25.8 % — ABNORMAL LOW (ref 36.0–46.0)
Lymphocytes Relative: 37 % (ref 12–46)
MCHC: 36.8 g/dL — ABNORMAL HIGH (ref 30.0–36.0)
Monocytes Absolute: 0.9 10*3/uL (ref 0.1–1.0)
Neutro Abs: 4 10*3/uL (ref 1.7–7.7)
Platelets: 213 10*3/uL (ref 150–400)
RDW: 20.8 % — ABNORMAL HIGH (ref 11.5–15.5)
WBC: 8.2 10*3/uL (ref 4.0–10.5)

## 2013-05-10 LAB — RETICULOCYTES: ABS Retic: 380.8 10*3/uL — ABNORMAL HIGH (ref 19.0–186.0)

## 2013-05-10 MED ORDER — FOLIC ACID 1 MG PO TABS: 1.0000 mg | ORAL_TABLET | Freq: Every day | ORAL | Status: DC

## 2013-05-10 MED ORDER — OXYCODONE-ACETAMINOPHEN 10-325 MG PO TABS: 1.0000 | ORAL_TABLET | Freq: Four times a day (QID) | ORAL | Status: DC | PRN

## 2013-05-10 NOTE — Progress Notes (Signed)
[facility]  Joan Martin. DOB 02-15-1986,  MRN [id number]  PCP: Joan Schiller, MD  05/10/2013   Chief Complaint  Patient presents with  . Follow-up    Subjective: Joan Martin is a in today for follow up she has c/o being tired and needing to increase her pain medication because current dose isn't effective. Reviewed labs and determine that she would need to be admitted for acute management of symptoms. Arthralgia pain 8/10.   Review of Systems  Constitutional: Negative.   HENT: Negative.   Eyes: Negative.   Respiratory: Negative.   Gastrointestinal: Positive for heartburn.  Musculoskeletal: Positive for myalgias.  Neurological: Negative.   Endo/Heme/Allergies: Negative.   Psychiatric/Behavioral: Negative.      Allergies  Allergen Reactions  . Cephalosporins     Possible family reaction  . Citrus Other (See Comments)    Severity varies/ Reaction varies hives around mouth to swelling around mouth and lips.   . Morphine And Related Hives    Patient states she can take IV dilaudid with benadryl     Outpatient Encounter Prescriptions as of 05/10/2013  Medication Sig Dispense Refill  . albuterol (PROVENTIL HFA;VENTOLIN HFA) 108 (90 BASE) MCG/ACT inhaler Inhale 2 puffs into the lungs every 6 (six) hours as needed for wheezing.      . cholecalciferol (VITAMIN D) 1000 UNITS tablet Take 1,000 Units by mouth daily.      . diphenhydrAMINE (BENADRYL) 25 MG tablet Take 25 mg by mouth every 6 (six) hours as needed for itching.      . Fluticasone-Salmeterol (ADVAIR) 250-50 MCG/DOSE AEPB Inhale 1 puff into the lungs daily.      . folic acid (FOLVITE) 1 MG tablet Take 1 mg by mouth daily.      . hydroxyurea (HYDREA) 500 MG capsule Take 500 mg by mouth 2 (two) times daily. May take with food to minimize GI side effects.      . mometasone (NASONEX) 50 MCG/ACT nasal spray Place 2 sprays into the nose daily.      . ondansetron (ZOFRAN) 4 MG tablet Take 1 tablet  (4 mg total) by mouth every 8 (eight) hours as needed for nausea.  15 tablet  0  . oxyCODONE-acetaminophen (PERCOCET) 10-325 MG per tablet Take 1 tablet by mouth every 6 (six) hours as needed for pain.      . promethazine (PHENERGAN) 25 MG tablet Take 25 mg by mouth every 6 (six) hours as needed for nausea.      . traMADol (ULTRAM) 50 MG tablet Take 1 tablet (50 mg total) by mouth every 8 (eight) hours as needed for pain.  15 tablet  0  . [DISCONTINUED] omeprazole (PRILOSEC) 20 MG capsule Take 1 capsule (20 mg total) by mouth daily.  30 capsule     No facility-administered encounter medications on file as of 05/10/2013.    Physical Exam   Filed Vitals:   05/10/13 1324  BP: 119/64  Pulse: 77  Temp: 98.3 F (36.8 C)    General: Alert, awake, oriented x3, in no acute distress.  HEENT: [place]/AT PEERL, EOMI, positive icteric sclera  Neck: Trachea midline,  no masses, no thyromegal,y no JVD, no carotid bruit OROPHARYNX:  Moist, No exudate/ erythema/lesions.  Heart: Regular rate and rhythm, without murmurs, rubs, gallops,   Lungs: Clear to auscultation, no wheezing or rhonchi noted. No increased vocal fremitus resonant to percussion  Abdomen: Soft, nontender, nondistended, positive bowel sounds, no masses no hepatosplenomegaly noted..  Neuro: No  focal neurological deficits noted cranial nerves II through XII grossly intact.  Musculoskeletal: No warm swelling or erythema around joints, no spinal tenderness noted. Psychiatric: Patient alert and oriented x3 Lymph node survey: No cervical axillary or inguinal lymphadenopathy noted.      Assessment/Plan:  1. Vaso occlusive crisis: type and cross plan to repeat labs in AM and  Review for exchange transfusion. In AM for aggressive hydration and IV analgesic. Review labs with pt. Cont maintenance medications  hydroxyurea and folic acid.  2. Acute on chronic pain: Current medication Percocet is not managing pain secondary to active hemolysis at this  time. Will re-evaluate after crisis 3. Constipation: secondary from narcotic use OTC sennS   Gwinda Passe, [facility]

## 2013-05-10 NOTE — Procedures (Signed)
[facility]  Procedure Note  Joan Martin [id number]:[id number] DOB: November 10, 1985 DOA: 05/10/2013   PCP: MATTHEWS,MICHELLE A., MD   Associated Diagnosis: Sickle Cell  Procedure Note: labs drawn per order   Condition During Procedure: patient denies any discomfort during blood draw   Condition at Discharge: patient stable.  Patient instructed to keep blood band on until return to clinic tomorrow.  Patient verbalizes understanding.   Lanae Boast, RN  [facility]

## 2013-05-11 ENCOUNTER — Non-Acute Institutional Stay (HOSPITAL_COMMUNITY)
Admission: AD | Admit: 2013-05-11 | Discharge: 2013-05-11 | Disposition: A | Discharge status: Home / Self Care | Payer: Medicaid Other | Source: Ambulatory Visit | Attending: Internal Medicine | Admitting: Internal Medicine

## 2013-05-11 ENCOUNTER — Encounter (HOSPITAL_COMMUNITY): Payer: Self-pay | Admitting: Hematology

## 2013-05-11 DIAGNOSIS — M545 Low back pain, unspecified: Secondary | ICD-10-CM | POA: Insufficient documentation

## 2013-05-11 DIAGNOSIS — Z8673 Personal history of transient ischemic attack (TIA), and cerebral infarction without residual deficits: Secondary | ICD-10-CM | POA: Insufficient documentation

## 2013-05-11 DIAGNOSIS — G8929 Other chronic pain: Secondary | ICD-10-CM | POA: Insufficient documentation

## 2013-05-11 DIAGNOSIS — D5700 Hb-SS disease with crisis, unspecified: Secondary | ICD-10-CM | POA: Insufficient documentation

## 2013-05-11 DIAGNOSIS — R52 Pain, unspecified: Secondary | ICD-10-CM | POA: Insufficient documentation

## 2013-05-11 DIAGNOSIS — E860 Dehydration: Secondary | ICD-10-CM | POA: Insufficient documentation

## 2013-05-11 LAB — CBC WITH DIFFERENTIAL/PLATELET
Basophils Relative: 2 % — ABNORMAL HIGH (ref 0–1)
Eosinophils Absolute: 0.1 10*3/uL (ref 0.0–0.7)
Eosinophils Relative: 1 % (ref 0–5)
Eosinophils Relative: 2 % (ref 0–5)
HCT: 26.5 % — ABNORMAL LOW (ref 36.0–46.0)
Hemoglobin: 9.4 g/dL — ABNORMAL LOW (ref 12.0–15.0)
Lymphocytes Relative: 33 % (ref 12–46)
MCV: 106 fL — ABNORMAL HIGH (ref 78.0–100.0)
Metamyelocytes Relative: 0 %
Monocytes Absolute: 0.5 10*3/uL (ref 0.1–1.0)
Monocytes Relative: 7 % (ref 3–12)
Monocytes Relative: 13 % — ABNORMAL HIGH (ref 3–12)
Neutrophils Relative %: 50 % (ref 43–77)
Platelets: 216 10*3/uL (ref 150–400)
RBC: 2.5 MIL/uL — ABNORMAL LOW (ref 3.87–5.11)
RBC: 2.51 MIL/uL — ABNORMAL LOW (ref 3.87–5.11)
WBC: 7.2 10*3/uL (ref 4.0–10.5)
WBC: 6.9 10*3/uL (ref 4.0–10.5)
nRBC: 22 /100 WBC — ABNORMAL HIGH

## 2013-05-11 LAB — COMPREHENSIVE METABOLIC PANEL
ALT: 37 U/L — ABNORMAL HIGH (ref 0–35)
BUN: 10 mg/dL (ref 6–23)
Calcium: 9.3 mg/dL (ref 8.4–10.5)
Creatinine, Ser: 0.64 mg/dL (ref 0.50–1.10)
GFR calc Af Amer: >90 mL/min (ref >90)
Glucose, Bld: 91 mg/dL (ref 70–99)
Sodium: 136 mEq/L (ref 135–145)
Total Protein: 8.1 g/dL (ref 6.0–8.3)

## 2013-05-11 LAB — PREPARE RBC (CROSSMATCH)

## 2013-05-11 MED ORDER — ONDANSETRON HCL 4 MG/2ML IJ SOLN
4.0000 mg | Freq: Once | INTRAMUSCULAR | Status: AC
  Administered 2013-05-11: 4 mg via INTRAVENOUS
  Filled 2013-05-11: qty 2

## 2013-05-11 MED ORDER — PROMETHAZINE HCL 25 MG PO TABS: 12.5000 mg | ORAL_TABLET | ORAL | Status: DC | PRN

## 2013-05-11 MED ORDER — DIPHENHYDRAMINE HCL 25 MG PO CAPS
25.0000 mg | ORAL_CAPSULE | ORAL | Status: DC | PRN
  Administered 2013-05-11: 50 mg via ORAL
  Filled 2013-05-11: qty 2

## 2013-05-11 MED ORDER — HYDROMORPHONE HCL PF 2 MG/ML IJ SOLN
1.0000 mg | INTRAMUSCULAR | Status: DC | PRN
  Administered 2013-05-11 (×2): 1 mg via INTRAVENOUS
  Filled 2013-05-11 (×2): qty 1

## 2013-05-11 MED ORDER — SENNOSIDES-DOCUSATE SODIUM 8.6-50 MG PO TABS
1.0000 | ORAL_TABLET | Freq: Two times a day (BID) | ORAL | Status: DC
  Administered 2013-05-11: 1 via ORAL
  Filled 2013-05-11: qty 1

## 2013-05-11 MED ORDER — DIPHENHYDRAMINE HCL 50 MG/ML IJ SOLN
12.5000 mg | INTRAMUSCULAR | Status: DC | PRN
  Administered 2013-05-11 (×2): 12.5 mg via INTRAVENOUS
  Filled 2013-05-11 (×2): qty 1

## 2013-05-11 MED ORDER — ACETAMINOPHEN 325 MG PO TABS
ORAL_TABLET | ORAL | Status: AC
  Filled 2013-05-11: qty 2

## 2013-05-11 MED ORDER — FOLIC ACID 1 MG PO TABS
1.0000 mg | ORAL_TABLET | Freq: Every day | ORAL | Status: DC
  Administered 2013-05-11: 1 mg via ORAL
  Filled 2013-05-11: qty 1

## 2013-05-11 MED ORDER — ONDANSETRON HCL 4 MG/2ML IJ SOLN: 4.0000 mg | Freq: Once | INTRAMUSCULAR | Status: AC

## 2013-05-11 MED ORDER — DEXTROSE-NACL 5-0.45 % IV SOLN
INTRAVENOUS | Status: DC
  Administered 2013-05-11: 09:00:00 via INTRAVENOUS

## 2013-05-11 MED ORDER — ACETAMINOPHEN 325 MG PO TABS
650.0000 mg | ORAL_TABLET | Freq: Once | ORAL | Status: AC
  Administered 2013-05-11: 650 mg via ORAL

## 2013-05-11 MED ORDER — KETOROLAC TROMETHAMINE 30 MG/ML IJ SOLN
30.0000 mg | Freq: Four times a day (QID) | INTRAMUSCULAR | Status: DC
  Administered 2013-05-11 (×2): 30 mg via INTRAVENOUS
  Filled 2013-05-11 (×2): qty 1

## 2013-05-11 MED ORDER — PROMETHAZINE HCL 12.5 MG RE SUPP
12.5000 mg | RECTAL | Status: DC | PRN
  Filled 2013-05-11: qty 2

## 2013-05-11 NOTE — Procedures (Signed)
[facility]  Procedure Note  Joan Martin XBJ:[id number] DOB: Jan 04, 1986 DOA: 05/11/2013   PCP: MATTHEWS,MICHELLE A., MD   Associated Diagnosis: Sickle Cell Disease  Procedure Note: Removed 300cc blood via Left AC IV with no difficulty.    Condition During Procedure: tolerated well, no complaints, patient in no apparent distress   Condition at Discharge: Tolerated well. VSS. Resting quietly in bed.    Allyn Kenner, RN  [facility]

## 2013-05-11 NOTE — Discharge Summary (Signed)
[facility] Discharge Summary   Patient ID: Joan Martin MRN: [id number] DOB/AGE: 29/27/1987 28 y.o.  Admit date: 05/11/2013 Discharge date: 05/11/2013  Primary Care Physician:  Joan A., MD  Admission Diagnoses:  Active Problems:   Vaso occlusive crisis    Acute on chronic pain     Dehydration  Discharge Diagnoses:   Vaso occlusive crisis    Acute on chronic pain     Dehydration   Discharge Medications:    Medication List    ASK your doctor about these medications       albuterol 108 (90 BASE) MCG/ACT inhaler  Commonly known as:  PROVENTIL HFA;VENTOLIN HFA  Inhale 2 puffs into the lungs every 6 (six) hours as needed for wheezing.     cholecalciferol 1000 UNITS tablet  Commonly known as:  VITAMIN D  Take 1,000 Units by mouth daily.     diphenhydrAMINE 25 MG tablet  Commonly known as:  BENADRYL  Take 25 mg by mouth every 6 (six) hours as needed for itching.     Fluticasone-Salmeterol 250-50 MCG/DOSE Aepb  Commonly known as:  ADVAIR  Inhale 1 puff into the lungs daily.     folic acid 1 MG tablet  Commonly known as:  FOLVITE  Take 1 tablet (1 mg total) by mouth daily.     hydroxyurea 500 MG capsule  Commonly known as:  HYDREA  Take 500 mg by mouth 2 (two) times daily. May take with food to minimize GI side effects.     mometasone 50 MCG/ACT nasal spray  Commonly known as:  NASONEX  Place 2 sprays into the nose daily.     ondansetron 4 MG tablet  Commonly known as:  ZOFRAN  Take 1 tablet (4 mg total) by mouth every 8 (eight) hours as needed for nausea.     oxyCODONE-acetaminophen 10-325 MG per tablet  Commonly known as:  PERCOCET  Take 1 tablet by mouth every 6 (six) hours as needed for pain.     promethazine 25 MG tablet  Commonly known as:  PHENERGAN  Take 25 mg by mouth every 6 (six) hours as needed for nausea.     traMADol 50 MG tablet  Commonly known as:  ULTRAM  Take 1 tablet (50 mg total) by mouth every 8 (eight) hours  as needed for pain.         Consults:   None   Significant Diagnostic Studies:  No results found.   [facility] Course:  For complete details please refer to admission H and P, but in brief, [name] is a 27 year old African American female in today for continue tx of crisis: Treatment course included  1. Clinically dehydrated : hydrated with D51/2 125cc/hr  2. Vaso occlusive crisis with active hemolysis: opoid naive prn 0.5-1mg  hydromorphone, antiemetic and anti pruritis   Physical Exam at Discharge:  BP 116/83  Pulse 57  Temp(Src) 97.7 F (36.5 C) (Oral)  Resp 18  Ht 5\' 7"  (1.702 m)  Wt 58.968 kg (130 lb)  BMI 20.36 kg/m2  SpO2 100%  LMP 04/24/2013  Gen: Alert, cooperative, no distress Cardiovascular :Regular rate and rhythm, S1 and S2 normal, no murmur, rub or gallop  Respiratory:Clear to auscultation bilaterally, respirations unlabored, no vocal fremitus  Gastrointestinal:Soft, non-tender, bowel sounds active all four quadrants Extremities:Extremities normal, atraumatic, no cyanosis or edema  Pulses: 2+ and symmetric all extremities     Disposition at Discharge: 01-Home or Self Care  Discharge  Orders:   Condition at Discharge:   Stable  Time spent on Discharge:  Greater than 30 minutes.  SignedRanda Evens, MICHELLE P 05/11/2013, 5:06 PM

## 2013-05-11 NOTE — H&P (Signed)
[facility] History and Physical   Date: 05/11/2013  Patient name: Joan Martin Medical record number: [id number] Date of birth: 1985-12-12 Age: 27 y.o. Gender: female PCP: MATTHEWS,MICHELLE A., MD  Attending physician: Rometta Emery, MD  Chief Complaint: generals aches and pain esp in lower back  History of Present Illness: Joan Martin is a 27 year old African American female occupied by her sister. Has noticed her pain not being well control and would wait til PCP apt to discussed. She was previously seen yesterday for follow-up and determine after lab results she would be tx on [facility] for Vaso occlusive crisis. She has minimal to no pain and her current pain is rated 5/10. There are no provacative or palliative factors she is unable to associate her pain crisis with at this time. She denies h/a, n/v/d, dysuria, shortness of breath or chest pain.   Meds: Prescriptions prior to admission  Medication Sig Dispense Refill  . albuterol (PROVENTIL HFA;VENTOLIN HFA) 108 (90 BASE) MCG/ACT inhaler Inhale 2 puffs into the lungs every 6 (six) hours as needed for wheezing.      . cholecalciferol (VITAMIN D) 1000 UNITS tablet Take 1,000 Units by mouth daily.      . diphenhydrAMINE (BENADRYL) 25 MG tablet Take 25 mg by mouth every 6 (six) hours as needed for itching.      . Fluticasone-Salmeterol (ADVAIR) 250-50 MCG/DOSE AEPB Inhale 1 puff into the lungs daily.      . folic acid (FOLVITE) 1 MG tablet Take 1 tablet (1 mg total) by mouth daily.  30 tablet  3  . hydroxyurea (HYDREA) 500 MG capsule Take 500 mg by mouth 2 (two) times daily. May take with food to minimize GI side effects.      . mometasone (NASONEX) 50 MCG/ACT nasal spray Place 2 sprays into the nose daily.      . ondansetron (ZOFRAN) 4 MG tablet Take 1 tablet (4 mg total) by mouth every 8 (eight) hours as needed for nausea.  15 tablet  0  . oxyCODONE-acetaminophen (PERCOCET) 10-325 MG per tablet Take 1 tablet by mouth every 6  (six) hours as needed for pain.  60 tablet  0  . promethazine (PHENERGAN) 25 MG tablet Take 25 mg by mouth every 6 (six) hours as needed for nausea.      . traMADol (ULTRAM) 50 MG tablet Take 1 tablet (50 mg total) by mouth every 8 (eight) hours as needed for pain.  15 tablet  0    Allergies: Cephalosporins; Citrus; and Morphine and related Past Medical History  Diagnosis Date  . Sickle cell anemia   . Stroke     7 years ago  . Sickle cell anemia   . Asthma    Past Surgical History  Procedure Laterality Date  . Cholecystectomy     Family History  Problem Relation Age of Onset  . Diabetes Mother   . Hypertension Mother   . Diabetes Father   . Hypertension Father   . Cancer Maternal Aunt    History   Social History  . Marital Status: Single    Spouse Name: N/A    Number of Children: N/A  . Years of Education: N/A   Occupational History  . Not on file.   Social History Main Topics  . Smoking status: Never Smoker   . Smokeless tobacco: Not on file  . Alcohol Use: No  . Drug Use: No  . Sexually Active: Yes    Birth  Control/ Protection: Condom   Other Topics Concern  . Not on file   Social History Narrative  . No narrative on file    Review of Systems: A comprehensive review of systems was negative except for: generals aches and pain esp in lower back   Physical Exam: Blood pressure 116/83, pulse 57, temperature 97.7 F (36.5 C), temperature source Oral, resp. rate 18, height 5\' 7"  (1.702 m), weight 58.968 kg (130 lb), last menstrual period 04/24/2013, SpO2 100.00%. BP 124/84  Pulse 72  Temp(Src) 97.8 F (36.6 C) (Oral)  Resp 18  Ht 5\' 7"  (1.702 m)  Wt 58.968 kg (130 lb)  BMI 20.36 kg/m2  SpO2 97%  LMP 04/24/2013  General Appearance:    Alert, cooperative, no distress, appears stated age  Head:    Normocephalic, without obvious abnormality, atraumatic  Eyes:    PERRL, conjunctiva/corneas clear, EOM's intact, moderate icteric sclera  Ears:    Normal  TM's and external ear canals, both ears  Nose:   Nares normal, septum midline, mucosa normal, no drainage    or sinus tenderness  Throat:   Lips, mucosa, and tongue normal; teeth and gums normal  Neck:   Supple, symmetrical, trachea midline, no adenopathy;    thyroid:  no enlargement/tenderness/nodules; no carotid   bruit or JVD  Back:     Symmetric, no curvature, ROM normal, no CVA tenderness  Lungs:     Clear to auscultation bilaterally, respirations unlabored  Chest Wall:    No tenderness or deformity   Heart:    Regular rate and rhythm, S1 and S2 normal, no murmur, rub   or gallop  Breast Exam:     deferred   Abdomen:     Soft, non-tender, bowel sounds active all four quadrants,    no masses, no organomegaly  Genitalia:  deferred  Rectal:   deferred   Extremities:   Extremities normal, atraumatic, no cyanosis or edema  Pulses:   2+ and symmetric all extremities  Skin:   Skin color, texture, turgor normal, no rashes or lesions  Lymph nodes:   Cervical, supraclavicular, and axillary nodes normal  Neurologic:   CNII-XII intact, normal strength, sensation and reflexes    throughout    Lab results: Results for orders placed during the hospital encounter of 05/11/13 (from the past 24 hour(s))  CBC WITH DIFFERENTIAL     Status: Abnormal   Collection Time    05/11/13  8:50 AM      Result Value Range   WBC 7.2  4.0 - 10.5 K/uL   RBC 2.50 (*) 3.87 - 5.11 MIL/uL   Hemoglobin 9.9 (*) 12.0 - 15.0 g/dL   HCT 16.1 (*) 09.6 - 04.5 %   MCV 106.0 (*) 78.0 - 100.0 fL   MCH 39.6 (*) 26.0 - 34.0 pg   MCHC 37.4 (*) 30.0 - 36.0 g/dL   RDW 40.9 (*) 81.1 - 91.4 %   Platelets 216  150 - 400 K/uL   Neutrophils Relative % 61  43 - 77 %   Lymphocytes Relative 31  12 - 46 %   Monocytes Relative 7  3 - 12 %   Eosinophils Relative 1  0 - 5 %   Basophils Relative 0  0 - 1 %   Band Neutrophils 0  0 - 10 %   Metamyelocytes Relative 0     Myelocytes 0     Promyelocytes Absolute 0     Blasts 0  nRBC 22 (*) 0 /100 WBC   Neutro Abs 4.4  1.7 - 7.7 K/uL   Lymphs Abs 2.2  0.7 - 4.0 K/uL   Monocytes Absolute 0.5  0.1 - 1.0 K/uL   Eosinophils Absolute 0.1  0.0 - 0.7 K/uL   Basophils Absolute 0.0  0.0 - 0.1 K/uL   RBC Morphology MARKED POLYCHROMASIA    COMPREHENSIVE METABOLIC PANEL     Status: Abnormal   Collection Time    05/11/13  8:50 AM      Result Value Range   Sodium 136  135 - 145 mEq/L   Potassium 3.8  3.5 - 5.1 mEq/L   Chloride 103  96 - 112 mEq/L   CO2 19  19 - 32 mEq/L   Glucose, Bld 91  70 - 99 mg/dL   BUN 10  6 - 23 mg/dL   Creatinine, Ser 4.69  0.50 - 1.10 mg/dL   Calcium 9.3  8.4 - 62.9 mg/dL   Total Protein 8.1  6.0 - 8.3 g/dL   Albumin 3.2 (*) 3.5 - 5.2 g/dL   AST 91 (*) 0 - 37 U/L   ALT 37 (*) 0 - 35 U/L   Alkaline Phosphatase 380 (*) 39 - 117 U/L   Total Bilirubin 11.5 (*) 0.3 - 1.2 mg/dL   GFR calc non Af Amer >90  >90 mL/min   GFR calc Af Amer >90  >90 mL/min  PREPARE RBC (CROSSMATCH)     Status: None   Collection Time    05/11/13 10:30 AM      Result Value Range   Order Confirmation ORDER PROCESSED BY BLOOD BANK      Imaging results:  No results found.   Assessment & Plan: 1. Vaso occlusive crisis with active hemolysis:  exhange removed 300cc/RBC and type and cross and  Transfuse 1 unit of RBC.  Analgesic IV Q 2 hrs prn 1-2 mg hydromorphone,  antiemetic and anti pruritic prn from analgic.Adjunct therapy Toradol Q 6hrs for inflammatory process.  Continue maintenance medication folic acid and hydroyxurea.  2. Acute on chronic pain: home med held percocet and tramadol resume at d/c  3. Clinically dehydrated: D51/2 NS at 125cc/hr   EDWARDS, MICHELLE P 05/11/2013, 4:58 PM

## 2013-05-11 NOTE — Progress Notes (Signed)
Patient discharged home. Discharge instructions given. VSS. Patient ambulatory and in no apparent distress. Patient Left [facility] with belongings via wheelchair escorted by RN and family member. Patient to return to day hospital in AM for follow up. Patient acknowledges.

## 2013-05-12 ENCOUNTER — Non-Acute Institutional Stay (HOSPITAL_COMMUNITY)
Admission: AD | Admit: 2013-05-12 | Discharge: 2013-05-12 | Disposition: A | Discharge status: Home / Self Care | Payer: Medicaid Other | Attending: Internal Medicine | Admitting: Internal Medicine

## 2013-05-12 ENCOUNTER — Encounter (HOSPITAL_COMMUNITY): Payer: Self-pay | Admitting: Hematology

## 2013-05-12 DIAGNOSIS — E875 Hyperkalemia: Secondary | ICD-10-CM | POA: Insufficient documentation

## 2013-05-12 DIAGNOSIS — Z79899 Other long term (current) drug therapy: Secondary | ICD-10-CM | POA: Insufficient documentation

## 2013-05-12 DIAGNOSIS — D5700 Hb-SS disease with crisis, unspecified: Secondary | ICD-10-CM

## 2013-05-12 DIAGNOSIS — E860 Dehydration: Secondary | ICD-10-CM | POA: Insufficient documentation

## 2013-05-12 DIAGNOSIS — G8929 Other chronic pain: Secondary | ICD-10-CM | POA: Insufficient documentation

## 2013-05-12 DIAGNOSIS — K5900 Constipation, unspecified: Secondary | ICD-10-CM | POA: Insufficient documentation

## 2013-05-12 DIAGNOSIS — R52 Pain, unspecified: Secondary | ICD-10-CM | POA: Insufficient documentation

## 2013-05-12 DIAGNOSIS — D65 Disseminated intravascular coagulation [defibrination syndrome]: Secondary | ICD-10-CM | POA: Insufficient documentation

## 2013-05-12 LAB — CBC WITH DIFFERENTIAL/PLATELET
Basophils Absolute: 0.2 10*3/uL — ABNORMAL HIGH (ref 0.0–0.1)
Eosinophils Relative: 2 % (ref 0–5)
HCT: 27.1 % — ABNORMAL LOW (ref 36.0–46.0)
Lymphs Abs: 1.4 10*3/uL (ref 0.7–4.0)
MCH: 36.8 pg — ABNORMAL HIGH (ref 26.0–34.0)
MCV: 97.8 fL (ref 78.0–100.0)
Neutro Abs: 3.3 10*3/uL (ref 1.7–7.7)
Platelets: 344 10*3/uL (ref 150–400)
RBC: 2.77 MIL/uL — ABNORMAL LOW (ref 3.87–5.11)
RDW: 27.7 % — ABNORMAL HIGH (ref 11.5–15.5)
nRBC: 21 /100 WBC — ABNORMAL HIGH

## 2013-05-12 LAB — COMPREHENSIVE METABOLIC PANEL
ALT: 37 U/L — ABNORMAL HIGH (ref 0–35)
AST: 86 U/L — ABNORMAL HIGH (ref 0–37)
Albumin: 2.7 g/dL — ABNORMAL LOW (ref 3.5–5.2)
CO2: 20 mEq/L (ref 19–32)
Calcium: 8.2 mg/dL — ABNORMAL LOW (ref 8.4–10.5)
Creatinine, Ser: 0.71 mg/dL (ref 0.50–1.10)
GFR calc non Af Amer: >90 mL/min (ref >90)
Sodium: 137 mEq/L (ref 135–145)
Total Protein: 6.7 g/dL (ref 6.0–8.3)

## 2013-05-12 LAB — LACTATE DEHYDROGENASE

## 2013-05-12 MED ORDER — KETOROLAC TROMETHAMINE 30 MG/ML IJ SOLN
30.0000 mg | Freq: Four times a day (QID) | INTRAMUSCULAR | Status: DC
  Administered 2013-05-12 (×2): 30 mg via INTRAVENOUS
  Filled 2013-05-12 (×2): qty 1

## 2013-05-12 MED ORDER — HYDROMORPHONE HCL PF 2 MG/ML IJ SOLN
0.5000 mg | INTRAMUSCULAR | Status: DC | PRN
  Administered 2013-05-12 (×2): 0.5 mg via INTRAVENOUS
  Filled 2013-05-12: qty 1

## 2013-05-12 MED ORDER — HYDROMORPHONE HCL PF 2 MG/ML IJ SOLN
0.5000 mg | INTRAMUSCULAR | Status: DC | PRN
  Administered 2013-05-12: 0.5 mg via INTRAVENOUS
  Filled 2013-05-12 (×2): qty 1

## 2013-05-12 MED ORDER — DEXTROSE-NACL 5-0.45 % IV SOLN
INTRAVENOUS | Status: DC
  Administered 2013-05-12 (×2): via INTRAVENOUS

## 2013-05-12 MED ORDER — ONDANSETRON HCL 4 MG PO TABS
4.0000 mg | ORAL_TABLET | ORAL | Status: DC | PRN
Start: 2013-05-12 — End: 2013-05-12
  Administered 2013-05-12 (×2): 4 mg via ORAL
  Filled 2013-05-12 (×2): qty 1

## 2013-05-12 NOTE — Progress Notes (Signed)
CRITICAL VALUE ALERT  Critical value received:  Potassium greater than 7.5  Date of notification:  05/12/2013  Time of notification:  11:38  Critical value read back:yes  Nurse who received alert:  Lanae Boast, RN  MD notified (1st page):  Marcelino Duster, NP notified via telephone, orders for a redraw

## 2013-05-12 NOTE — Progress Notes (Signed)
Patient ID: Joan Martin, female   DOB: [birth year], 27 y.o.   MRN: [id number] IV removed, cannula intact. Discharge papers given and signed. Pt left ambulating in no acute distress.

## 2013-05-14 LAB — TYPE AND SCREEN: Unit division: 0

## 2013-05-15 NOTE — Discharge Summary (Signed)
Agree with above 

## 2013-05-15 NOTE — H&P (Signed)
Agree with above 

## 2013-05-16 NOTE — H&P (Signed)
[facility] History and Physical   Date: 05/12/2013  Patient name: Joan Martin Medical record number: [id number] Date of birth: 02/21/1986 Age: 27 y.o. Gender: female PCP: MATTHEWS,MICHELLE A., MD  Attending physician: No att. providers found  Chief Complaint: tired and arthralgia pain   History of Present Illness: Ms. Skop is a 27 year old female who returns to the clinic very tearful and upset with self stating she hates feeling this way I'm not use to having pain. " I try to not let it get to me".  She is admitted to the [facility] for cont treatment of management of symptoms. Patient is opoid naive. She has pain all over. She is unable to identify any provacative factors but palliatives is warm heat, baths and showers. Pain is rated 5/10 baseline 0. She denies visual changes,  chest pain, shortness of breath, n/v/d. ( note 1 incidence of n/v from medication on yesterday resoled with antiemetic) and dysuria.  Meds: No prescriptions prior to admission    Allergies: Cephalosporins; Citrus; and Morphine and related Past Medical History  Diagnosis Date  . Sickle cell anemia   . Stroke     7 years ago  . Sickle cell anemia   . Asthma    Past Surgical History  Procedure Laterality Date  . Cholecystectomy     Family History  Problem Relation Age of Onset  . Diabetes Mother   . Hypertension Mother   . Diabetes Father   . Hypertension Father   . Cancer Maternal Aunt    History   Social History  . Marital Status: Single    Spouse Name: N/A    Number of Children: N/A  . Years of Education: N/A   Occupational History  . Not on file.   Social History Main Topics  . Smoking status: Never Smoker   . Smokeless tobacco: Not on file  . Alcohol Use: No  . Drug Use: No  . Sexually Active: Yes    Birth Control/ Protection: Condom   Other Topics Concern  . Not on file   Social History Narrative  . No narrative on file    Review of Systems: A comprehensive  review of systems was negative except for: Musculoskeletal: positive for arthralgias  Physical Exam: Blood pressure 118/60, pulse 75, temperature 98.3 F (36.8 C), temperature source Oral, resp. rate 18, last menstrual period 04/24/2013, SpO2 95.00%. BP 118/60  Pulse 75  Temp(Src) 98.3 F (36.8 C) (Oral)  Resp 18  SpO2 95%  LMP 04/24/2013  General Appearance:    Alert, cooperative, mild distress, tearful  Head:    Normocephalic, without obvious abnormality, atraumatic  Eyes:    PERRL, conjunctiva/corneas clear, EOM's intact, fundi    benign, both eyes  Ears:    Normal TM's and external ear canals, both ears  Nose:   Nares normal, septum midline, mucosa normal, no drainage    or sinus tenderness  Throat:   Lips, mucosa, and tongue normal; teeth and gums normal  Neck:   Supple, symmetrical, trachea midlinedules  Back:     Symmetric, no curvature, ROM normal, no CVA tenderness  Lungs:     Clear to auscultation bilaterally, respirations unlabored, No vocal fremitus   Chest Wall:    No tenderness or deformity   Heart:    Regular rate and rhythm, S1 and S2 normal, no murmur, rub   or gallop  Breast Exam:  deferred  Abdomen:   Soft, nontender, nondistended, positive bowel sounds  Genitalia:  Deferred  Rectal:  Deferred  Extremities:   Extremities normal, atraumatic, no cyanosis or edema  Pulses:   2+ and symmetric all extremities  Skin:   Skin color, texture, turgor normal, no rashes or lesions  Lymph nodes:   Cervical, supraclavicular, and axillary nodes normal  Neurologic:   CNII-XII intact, normal strength, sensation and reflexes    throughout    Lab results: No results found for this or any previous visit (from the past 24 hour(s)).  Imaging results:  No results found.   Assessment & Plan: 1. Vaso occlusive crisis with active hemolysis: Aggresively treat with D51/2 @ 125cc/hr for hydration and IV analgesic. Toradol and heat for adjunct therapy. Cont maintenance medications  hydroxyurea and folic acid.  2. Hyperkalemia: critically elevated  EKG order NSR repeat labs  3. Clinical dehydration: aggressively hydrate/replete  4. Acute on chronic pain: Toradol and low dose of IV hydromorphone   5. Constipation: secondary from narcotic sennS    EDWARDS, MICHELLE P 05/16/2013, 2:05 PM

## 2013-05-16 NOTE — Discharge Summary (Signed)
[facility] Discharge Summary   Patient ID: Joan Martin MRN: [id number] DOB/AGE: October 12, 1986 27 y.o.  Admit date: 05/12/2013 Discharge date: 05/16/2013  Primary Care Physician:  MATTHEWS,MICHELLE A., MD  Admission Diagnoses:  Active Problems: Vaso occlusive crisis  Acute on chronic pain  Dehydration  Active hemolysis     Discharge Diagnoses:   Vaso occlusive crisis  Acute on chronic pain  Active hemolysis     Discharge Medications:    Medication List    ASK your doctor about these medications       albuterol 108 (90 BASE) MCG/ACT inhaler  Commonly known as:  PROVENTIL HFA;VENTOLIN HFA  Inhale 2 puffs into the lungs every 6 (six) hours as needed for wheezing.     cholecalciferol 1000 UNITS tablet  Commonly known as:  VITAMIN D  Take 1,000 Units by mouth daily.     diphenhydrAMINE 25 MG tablet  Commonly known as:  BENADRYL  Take 25 mg by mouth every 6 (six) hours as needed for itching.     Fluticasone-Salmeterol 250-50 MCG/DOSE Aepb  Commonly known as:  ADVAIR  Inhale 1 puff into the lungs daily.     folic acid 1 MG tablet  Commonly known as:  FOLVITE  Take 1 tablet (1 mg total) by mouth daily.     hydroxyurea 500 MG capsule  Commonly known as:  HYDREA  Take 500 mg by mouth 2 (two) times daily. May take with food to minimize GI side effects.     mometasone 50 MCG/ACT nasal spray  Commonly known as:  NASONEX  Place 2 sprays into the nose daily.     ondansetron 4 MG tablet  Commonly known as:  ZOFRAN  Take 1 tablet (4 mg total) by mouth every 8 (eight) hours as needed for nausea.     oxyCODONE-acetaminophen 10-325 MG per tablet  Commonly known as:  PERCOCET  Take 1 tablet by mouth every 6 (six) hours as needed for pain.     promethazine 25 MG tablet  Commonly known as:  PHENERGAN  Take 25 mg by mouth every 6 (six) hours as needed for nausea.     traMADol 50 MG tablet  Commonly known as:  ULTRAM  Take 1 tablet (50 mg total) by mouth  every 8 (eight) hours as needed for pain.         Consults:   None  Significant Diagnostic Studies:  No results found.   [facility] Course:  For complete details please refer to admission H and P, but in brief, Joan Martin is a 27 year old is re admitted for clinical dehydration and management of vaso occlusive crisis with pain.  1. Vaso occlusive crisis with active hemolysis: aggressively hydrate with D5 1/2 NS at 125cc/hr. Toradol for adjunct therapy and heating device (she brought her heating blanket from home). PRN  Hydromorphone, antiemetic and antipruitics.  2. Clinical dehydration: aggressively repleted with IVF 3. Acute on chronic Pain : Toradol and hydromorphone.  Physical Exam at Discharge:  BP 118/60  Pulse 75  Temp(Src) 98.3 F (36.8 C) (Oral)  Resp 18  SpO2 95%  LMP 04/24/2013  Gen: Alert, cooperative, no distress, appears stated age  Cardiovascular: Regular rate and rhythm, S1 and S2 normal, no murmur, rub or gallop  Respiratory: Clear to auscultation bilaterally, respirations unlabored, No vocal fremitus  Gastrointestinal:Soft, non-tender, bowel sounds active all four quadrants Extremities:Extremities normal, atraumatic, no cyanosis or edema  Pulses: 2+ and symmetric all extremities  Disposition at Discharge: 01-Home or Self Care  Discharge Orders: Follow up in 1-2 weeks. Rest drink plenty of fluids esp water   Condition at Discharge:   Stable  Time spent on Discharge:  Greater than 30 minutes.  Signed: EDWARDS, MICHELLE P 05/16/2013, 1:44 PM

## 2013-05-31 ENCOUNTER — Ambulatory Visit (INDEPENDENT_AMBULATORY_CARE_PROVIDER_SITE_OTHER): Payer: Medicaid Other | Admitting: Internal Medicine

## 2013-05-31 ENCOUNTER — Encounter: Payer: Self-pay | Admitting: Internal Medicine

## 2013-05-31 VITALS — BP 136/83 | HR 108 | Resp 16 | Ht 67.5 in | Wt 128.0 lb

## 2013-05-31 DIAGNOSIS — D5700 Hb-SS disease with crisis, unspecified: Secondary | ICD-10-CM

## 2013-05-31 NOTE — H&P (Signed)
Patient seen and examined. Agree with notes from Gwinda Passe, NP above.

## 2013-06-01 ENCOUNTER — Encounter (HOSPITAL_COMMUNITY): Payer: Self-pay | Admitting: *Deleted

## 2013-06-01 ENCOUNTER — Non-Acute Institutional Stay (HOSPITAL_COMMUNITY)
Admission: AD | Admit: 2013-06-01 | Discharge: 2013-06-01 | Disposition: A | Discharge status: Home / Self Care | Payer: Medicaid Other | Source: Ambulatory Visit | Attending: Internal Medicine | Admitting: Internal Medicine

## 2013-06-01 ENCOUNTER — Encounter: Payer: Self-pay | Admitting: Internal Medicine

## 2013-06-01 DIAGNOSIS — Z8673 Personal history of transient ischemic attack (TIA), and cerebral infarction without residual deficits: Secondary | ICD-10-CM | POA: Insufficient documentation

## 2013-06-01 DIAGNOSIS — Z79899 Other long term (current) drug therapy: Secondary | ICD-10-CM | POA: Insufficient documentation

## 2013-06-01 DIAGNOSIS — E860 Dehydration: Secondary | ICD-10-CM | POA: Insufficient documentation

## 2013-06-01 DIAGNOSIS — D5700 Hb-SS disease with crisis, unspecified: Secondary | ICD-10-CM

## 2013-06-01 LAB — CBC WITH DIFFERENTIAL/PLATELET
Basophils Relative: 3 % — ABNORMAL HIGH (ref 0–1)
Eosinophils Relative: 1 % (ref 0–5)
HCT: 26.5 % — ABNORMAL LOW (ref 36.0–46.0)
Hemoglobin: 9.6 g/dL — ABNORMAL LOW (ref 12.0–15.0)
Lymphs Abs: 1.7 10*3/uL (ref 0.7–4.0)
MCH: 37.9 pg — ABNORMAL HIGH (ref 26.0–34.0)
MCV: 104.7 fL — ABNORMAL HIGH (ref 78.0–100.0)
Monocytes Absolute: 0.6 10*3/uL (ref 0.1–1.0)
Monocytes Relative: 12 % (ref 3–12)
Platelets: 228 10*3/uL (ref 150–400)
RBC: 2.53 MIL/uL — ABNORMAL LOW (ref 3.87–5.11)
WBC: 5 10*3/uL (ref 4.0–10.5)

## 2013-06-01 LAB — COMPREHENSIVE METABOLIC PANEL
ALT: 43 U/L — ABNORMAL HIGH (ref 0–35)
AST: 89 U/L — ABNORMAL HIGH (ref 0–37)
CO2: 21 mEq/L (ref 19–32)
Chloride: 103 mEq/L (ref 96–112)
Creatinine, Ser: 0.63 mg/dL (ref 0.50–1.10)
GFR calc Af Amer: >90 mL/min (ref >90)
GFR calc non Af Amer: >90 mL/min (ref >90)
Glucose, Bld: 116 mg/dL — ABNORMAL HIGH (ref 70–99)
Sodium: 135 mEq/L (ref 135–145)
Total Bilirubin: 8.8 mg/dL — ABNORMAL HIGH (ref 0.3–1.2)

## 2013-06-01 MED ORDER — TRAMADOL HCL 50 MG PO TABS: 50.0000 mg | ORAL_TABLET | Freq: Three times a day (TID) | ORAL | Status: DC | PRN

## 2013-06-01 MED ORDER — ONDANSETRON HCL 4 MG PO TABS: 4.0000 mg | ORAL_TABLET | Freq: Three times a day (TID) | ORAL | Status: DC | PRN

## 2013-06-01 MED ORDER — OXYCODONE-ACETAMINOPHEN 5-325 MG PO TABS
1.0000 | ORAL_TABLET | Freq: Four times a day (QID) | ORAL | Status: DC | PRN
  Administered 2013-06-01: 1 via ORAL
  Filled 2013-06-01: qty 1

## 2013-06-01 MED ORDER — OXYCODONE HCL 5 MG PO TABS
5.0000 mg | ORAL_TABLET | Freq: Four times a day (QID) | ORAL | Status: DC | PRN
  Administered 2013-06-01: 5 mg via ORAL
  Filled 2013-06-01: qty 1

## 2013-06-01 MED ORDER — FOLIC ACID 1 MG PO TABS
1.0000 mg | ORAL_TABLET | Freq: Every day | ORAL | Status: DC
  Administered 2013-06-01: 1 mg via ORAL
  Filled 2013-06-01: qty 1

## 2013-06-01 MED ORDER — DIPHENHYDRAMINE HCL 25 MG PO CAPS
25.0000 mg | ORAL_CAPSULE | Freq: Four times a day (QID) | ORAL | Status: DC | PRN
  Administered 2013-06-01: 25 mg via ORAL
  Filled 2013-06-01: qty 1

## 2013-06-01 MED ORDER — DEXTROSE-NACL 5-0.45 % IV SOLN: INTRAVENOUS | Status: DC

## 2013-06-01 MED ORDER — OXYCODONE-ACETAMINOPHEN 10-325 MG PO TABS: 1.0000 | ORAL_TABLET | Freq: Four times a day (QID) | ORAL | Status: DC | PRN

## 2013-06-01 MED ORDER — DEXTROSE-NACL 5-0.45 % IV SOLN
INTRAVENOUS | Status: DC
  Administered 2013-06-01: 10:00:00 via INTRAVENOUS

## 2013-06-01 NOTE — H&P (Signed)
[facility] History and Physical  Joan Martin MVH:[id number] DOB: 1986/02/20 DOA: 06/01/2013   PCP: MATTHEWS,MICHELLE A., MD   Chief Complaint: Pain in back and legs.  HPI:Pt seen in the office yesterday with early acute crisis. She tried conservative measures at home but presents today with persistent pain. Pain is still localized to back and legs and she  Rates pain as 5/10.  The pain is throbbing in nature, non-radiating and she is unable to identify any palliative or provocative features.   Review of Systems:  Constitutional: No weight loss, night sweats, Fevers, chills, fatigue.  HEENT: No headaches, dizziness, seizures, vision changes, difficulty swallowing,Tooth/dental problems,Sore throat, No sneezing, itching, ear ache, nasal congestion, post nasal drip,  Cardio-vascular: No chest pain, Orthopnea, PND, swelling in lower extremities, anasarca, dizziness, palpitations  GI: No heartburn, indigestion, abdominal pain, nausea, vomiting, diarrhea, change in bowel habits, loss of appetite  Resp: No shortness of breath with exertion or at rest. No excess mucus, no productive cough, No non-productive cough, No coughing up of blood.No change in color of mucus.No wheezing.No chest wall deformity  Skin: no rash or lesions.  GU: no dysuria, change in color of urine, no urgency or frequency. No flank pain.  Psych: No change in mood or affect. No depression or anxiety. No memory loss.    Past Medical History  Diagnosis Date  . Sickle cell anemia   . Stroke     7 years ago  . Sickle cell anemia   . Asthma    Past Surgical History  Procedure Laterality Date  . Cholecystectomy     Social History:  reports that she has never smoked. She does not have any smokeless tobacco history on file. She reports that she does not drink alcohol or use illicit drugs.  Allergies  Allergen Reactions  . Cephalosporins     Possible family reaction  . Citrus Other (See Comments)   Severity varies/ Reaction varies hives around mouth to swelling around mouth and lips.   . Morphine And Related Hives    Patient states she can take IV dilaudid with benadryl    Family History  Problem Relation Age of Onset  . Diabetes Mother   . Hypertension Mother   . Diabetes Father   . Hypertension Father   . Cancer Maternal Aunt     Prior to Admission medications   Medication Sig Start Date End Date Taking? Authorizing Provider  albuterol (PROVENTIL HFA;VENTOLIN HFA) 108 (90 BASE) MCG/ACT inhaler Inhale 2 puffs into the lungs every 6 (six) hours as needed for wheezing.   Yes Historical Provider, MD  cholecalciferol (VITAMIN D) 1000 UNITS tablet Take 1,000 Units by mouth daily.   Yes Historical Provider, MD  diphenhydrAMINE (BENADRYL) 25 MG tablet Take 25 mg by mouth every 6 (six) hours as needed for itching.   Yes Historical Provider, MD  Fluticasone-Salmeterol (ADVAIR) 250-50 MCG/DOSE AEPB Inhale 1 puff into the lungs daily.   Yes Historical Provider, MD  folic acid (FOLVITE) 1 MG tablet Take 1 tablet (1 mg total) by mouth daily. 05/10/13  Yes Grayce Sessions, NP  hydroxyurea (HYDREA) 500 MG capsule Take 500 mg by mouth 2 (two) times daily. May take with food to minimize GI side effects.   Yes Historical Provider, MD  mometasone (NASONEX) 50 MCG/ACT nasal spray Place 2 sprays into the nose daily.   Yes Historical Provider, MD  oxyCODONE-acetaminophen (PERCOCET) 10-325 MG per tablet Take 1 tablet by mouth every 6 (six) hours  as needed for pain. 05/10/13  Yes Grayce Sessions, NP  promethazine (PHENERGAN) 25 MG tablet Take 25 mg by mouth every 6 (six) hours as needed for nausea.   Yes Historical Provider, MD  traMADol (ULTRAM) 50 MG tablet Take 1 tablet (50 mg total) by mouth every 8 (eight) hours as needed for pain. 02/03/13 02/03/14 Yes Grayce Sessions, NP  ondansetron (ZOFRAN) 4 MG tablet Take 1 tablet (4 mg total) by mouth every 8 (eight) hours as needed for nausea. 02/03/13  02/03/14  Grayce Sessions, NP   Physical Exam: Filed Vitals:   06/01/13 0918  BP: 113/69  Pulse: 94  Temp: 98.4 F (36.9 C)  TempSrc: Oral  Resp: 18  SpO2: 92%   BP 113/68  Pulse 93  Temp(Src) 98.4 F (36.9 C) (Oral)  Resp 18  SpO2 96%  LMP 05/23/2013  General Appearance:    Alert, cooperative, no distress, appears stated age  Head:    Normocephalic, without obvious abnormality, atraumatic  Eyes:    PERRL, conjunctiva/corneas clear, EOM's intact, fundi    benign, both eyes with mild icterus.  Back:     Symmetric, no curvature, ROM normal, no CVA tenderness  Lungs:     Clear to auscultation bilaterally, respirations unlabored  Chest Wall:    No tenderness or deformity   Heart:    Mild tachycardia, S1 and S2 normal, no murmur, rub   or gallop  Abdomen:     Soft, non-tender, bowel sounds active all four quadrants,    no masses, no organomegaly  Extremities:   Extremities normal, atraumatic, no cyanosis or edema  Pulses:   2+ and symmetric all extremities  Skin:   Skin color, texture, turgor normal, no rashes or lesions  Lymph nodes:   Cervical, supraclavicular, and axillary nodes normal  Neurologic:   CNII-XII intact, normal strength, sensation and reflexes    throughout       Assessment/Plan: Active Problems: Hb SS with early acute vaso-occlusive episode: I have discussed care with patient and she feels that she would like to proceed with aggressive hydration before trying any analgesics.   Time spend: 30 minutes Code Status: Full Code Family Communication: N/A Disposition Plan: Home  MATTHEWS,MICHELLE A., MD  Pager [phone]  If 7PM-7AM, please contact night-coverage [email or web address] Password [facility] 06/01/2013, 9:29 AM

## 2013-06-01 NOTE — Discharge Summary (Signed)
Patient seen. Agree with above.

## 2013-06-01 NOTE — Discharge Summary (Signed)
Physician Discharge Summary  Joan Martin YQM:[id number] DOB: 10-Oct-1986 DOA: 06/01/2013  PCP: Manraj Yeo A., MD  Admit date: 06/01/2013 Discharge date: 06/01/2013  Discharge Diagnoses:  Active Problems: Hb SS with early vaso-occlusive episode Mild Dehydration   Discharge Condition: Good  Disposition: Home  Diet: Regular  Wt Readings from Last 3 Encounters:  05/31/13 128 lb (58.06 kg)  05/11/13 130 lb (58.968 kg)  05/10/13 128 lb (58.06 kg)    History of present illness:  Pt seen in the office yesterday with early acute crisis. She tried conservative measures at home but presents today with persistent pain. Pain is still localized to back and legs and she Rates pain as 5/10. The pain is throbbing in nature, non-radiating and she is unable to identify any palliative or provocative features.   Hospital Course:  Pt was admitted and treated with aggressive hydration. She was started on oral home regimen and did not require any IV analgesics. Pain resolved with aggressive hydration  Discharge Exam: Filed Vitals:   06/01/13 1312  BP: 113/68  Pulse: 93  Temp:   Resp:    Filed Vitals:   06/01/13 0918 06/01/13 1312  BP: 113/69 113/68  Pulse: 94 93  Temp: 98.4 F (36.9 C)   TempSrc: Oral   Resp: 18   SpO2: 92% 96%   General: Alert, awake, oriented x3, in no distress. Well appearing.  HEENT: [place]/AT PEERL, EOMI, anicteric.  Neck: Trachea midline, no masses, no thyromegal,y no JVD, no carotid bruit  Heart: Regular rate and rhythm, without murmurs, rubs, gallops.  Lungs: Clear to auscultation, no wheezing or rhonchi noted.  Abdomen: Soft, nontender, nondistended, positive bowel sounds, no masses no hepatosplenomegaly noted.  Neuro: No focal neurological deficits noted cranial nerves II through XII grossly intact. Strength normal in bilateral upper and lower extremities.  Musculoskeletal: No warm swelling or erythema around joints.  Psychiatric: Patient alert and oriented  x3, good insight and cognition, good recent to remote recall   Discharge Instructions  Discharge Orders   Future Orders Complete By Expires   Activity as tolerated - No restrictions  As directed    Diet general  As directed        Medication List         albuterol 108 (90 BASE) MCG/ACT inhaler  Commonly known as:  PROVENTIL HFA;VENTOLIN HFA  Inhale 2 puffs into the lungs every 6 (six) hours as needed for wheezing.     cholecalciferol 1000 UNITS tablet  Commonly known as:  VITAMIN D  Take 1,000 Units by mouth daily.     diphenhydrAMINE 25 MG tablet  Commonly known as:  BENADRYL  Take 25 mg by mouth every 6 (six) hours as needed for itching.     Fluticasone-Salmeterol 250-50 MCG/DOSE Aepb  Commonly known as:  ADVAIR  Inhale 1 puff into the lungs daily.     folic acid 1 MG tablet  Commonly known as:  FOLVITE  Take 1 tablet (1 mg total) by mouth daily.     hydroxyurea 500 MG capsule  Commonly known as:  HYDREA  Take 500 mg by mouth 2 (two) times daily. May take with food to minimize GI side effects.     mometasone 50 MCG/ACT nasal spray  Commonly known as:  NASONEX  Place 2 sprays into the nose daily.     ondansetron 4 MG tablet  Commonly known as:  ZOFRAN  Take 1 tablet (4 mg total) by mouth every 8 (eight) hours as needed for nausea.  oxyCODONE-acetaminophen 10-325 MG per tablet  Commonly known as:  PERCOCET  Take 1 tablet by mouth every 6 (six) hours as needed for pain.     promethazine 25 MG tablet  Commonly known as:  PHENERGAN  Take 25 mg by mouth every 6 (six) hours as needed for nausea.     traMADol 50 MG tablet  Commonly known as:  ULTRAM  Take 1 tablet (50 mg total) by mouth every 8 (eight) hours as needed for pain.         Labs: Basic Metabolic Panel:  Recent Labs Lab 06/01/13 0950  NA 135  K 3.5  CL 103  CO2 21  GLUCOSE 116*  BUN 6  CREATININE 0.63  CALCIUM 9.0   Liver Function Tests:  Recent Labs Lab 06/01/13 0950  AST 89*   ALT 43*  ALKPHOS 373*  BILITOT 8.8*  PROT 7.7  ALBUMIN 3.0*   No results found for this basename: LIPASE, AMYLASE,  in the last 168 hours No results found for this basename: AMMONIA,  in the last 168 hours CBC:  Recent Labs Lab 06/01/13 0950  WBC 5.0  NEUTROABS 2.4  HGB 9.6*  HCT 26.5*  MCV 104.7*  PLT 228    Time coordinating discharge: 40 minutes  Signed:  Talbert Trembath A.  06/01/2013, 3:13 PM

## 2013-06-01 NOTE — Progress Notes (Signed)
Patient discharged home. VSS. Discharge instructions given. Patient in no apparent distress. Patient left day hospital with belongings ambulatory via self.

## 2013-06-01 NOTE — Progress Notes (Signed)
  Subjective:    Patient ID: Joan Martin, female    DOB: 06-11-1986, 27 y.o.   MRN: [id number]  HPI: Pt with Hb SS here for follow up Her pain is usually 1-2/10 but is 6/19 today. It is localized to her legs and back. It is nonradiating and worsens with activity and improves with rest. The patient states that she has not taken any narcotic medication today as she is headed to a job interview and was to be clear minded while she is here.   Pt also appears a bit clinically dehydrated. However she has continued to have good urine output and is able to increase her oral intake.   Review of Systems  Constitutional: Negative.   HENT: Negative.   Eyes: Negative.   Respiratory: Negative.   Cardiovascular: Negative.   Gastrointestinal: Negative.   Endocrine: Negative.   Genitourinary: Negative.   Musculoskeletal: Positive for myalgias and arthralgias.  Skin: Negative.   Allergic/Immunologic: Negative.   Neurological: Negative.   Hematological: Negative.   Psychiatric/Behavioral: Negative.        Objective:   Physical Exam  Constitutional: She is oriented to person, place, and time. She appears well-developed and well-nourished.  HENT:  Head: Normocephalic and atraumatic.  Eyes: Conjunctivae and EOM are normal. Pupils are equal, round, and reactive to light. Scleral icterus (mild icterus at baseline.) is present.  Neck: Normal range of motion. Neck supple. No JVD present. No thyromegaly present.  Cardiovascular: Normal rate and regular rhythm.  Exam reveals no gallop and no friction rub.   No murmur heard. Pulmonary/Chest: Effort normal and breath sounds normal. She has no wheezes. She has no rales.  Abdominal: Soft. Bowel sounds are normal. She exhibits no distension and no mass. There is no tenderness. There is no rebound and no guarding.  Musculoskeletal: Normal range of motion.  Lymphadenopathy:    She has no cervical adenopathy.  Neurological: She is alert and oriented to person,  place, and time. No cranial nerve deficit.  Skin: Skin is warm and dry.  Psychiatric: She has a normal mood and affect. Her behavior is normal. Judgment and thought content normal.          Assessment & Plan:  1. Hb SS with early acute crisis: Pt does not want to stay for hydration today as she has a job interview. I have advised patient to hydrate aggressively by drinking 64 oz of fluid/hr and to take Motrin 800 mg until she can take her oral analgesics. Pt is to follow up tomorrow if she is still having pain . Continue Hydrea and Folic Acid.

## 2013-06-14 ENCOUNTER — Ambulatory Visit: Payer: Medicaid Other | Admitting: Internal Medicine

## 2013-06-23 ENCOUNTER — Encounter: Payer: Self-pay | Admitting: Internal Medicine

## 2013-06-23 ENCOUNTER — Ambulatory Visit (INDEPENDENT_AMBULATORY_CARE_PROVIDER_SITE_OTHER): Payer: Medicaid Other | Admitting: Internal Medicine

## 2013-06-23 VITALS — BP 109/72 | HR 80 | Temp 98.5°F | Resp 14 | Ht 67.25 in | Wt 134.0 lb

## 2013-06-23 DIAGNOSIS — D5700 Hb-SS disease with crisis, unspecified: Secondary | ICD-10-CM

## 2013-06-23 DIAGNOSIS — D571 Sickle-cell disease without crisis: Secondary | ICD-10-CM

## 2013-06-23 NOTE — Progress Notes (Signed)
  Subjective:    Patient ID: Joan Martin, female    DOB: September 02, 1986, 27 y.o.   MRN: [id number]  HPI; patient is here for followup appointment after having been in acute crisis approximately 2 weeks ago. The patient states that since then she has continued to aggressively hydrate herself and has had very minimal pain. She just started her new job and states that although she isn't supposed to high ambient temperatures but there wasn't related she has been able to maintain hydration and has not had any increased pain.  She's been compliant with folic acid and hydroxyurea.    Review of Systems  Constitutional: Negative.   HENT: Negative.   Eyes: Negative.   Respiratory: Negative.   Cardiovascular: Negative.   Gastrointestinal: Negative.   Endocrine: Negative.   Genitourinary: Negative.   Musculoskeletal: Positive for myalgias and back pain.  Skin: Negative.   Allergic/Immunologic: Negative.   Neurological: Negative.   Hematological: Negative.   Psychiatric/Behavioral: Negative.        Objective:   Physical Exam  Constitutional: She is oriented to person, place, and time. She appears well-developed and well-nourished.  HENT:  Head: Normocephalic and atraumatic.  Eyes: Conjunctivae and EOM are normal. Pupils are equal, round, and reactive to light. Scleral icterus (patient at baseline with mild icterus) is present.  Neck: Normal range of motion. Neck supple. No JVD present. No thyromegaly present.  Cardiovascular: Normal rate and regular rhythm.  Exam reveals no gallop and no friction rub.   No murmur heard. Pulmonary/Chest: Effort normal and breath sounds normal. She has no wheezes. She has no rales.  Abdominal: Soft. Bowel sounds are normal. She exhibits no distension and no mass. There is no tenderness.  Musculoskeletal: Normal range of motion.  Lymphadenopathy:    She has no cervical adenopathy.  Neurological: She is alert and oriented to person, place, and time. No cranial  nerve deficit.  Skin: Skin is warm and dry.  Psychiatric: She has a normal mood and affect. Her behavior is normal. Judgment and thought content normal.          Assessment & Plan:  HbSS without crisis: Clinically the patient appears to be doing very well she will continue on folic acid and hydroxyurea. She'll have CBC with differential done in approximately one week so that we can assess the effect of hydroxyurea on her blood counts.  With regard to her symptoms the patient will continue with aggressive hydration and use her estimated oral analgesics for pain control.   2. Health maintenance: The patient has deferred her influenza vaccine today and will obtain at the time that she has her blood obtained for laboratory studies. She is unsure as to when her last pneumococcal vaccine loss. She will get to that information from her physician in [place] and we'll make a determination as to whether or not the patient requires a pneumococcal vaccine at this time.  RTC: 6 months after receiving influenza vaccine and laboratory studies appear  Labs: Future labs for CBC with differential to be obtained on Monday, 06/28/2039.

## 2013-06-27 ENCOUNTER — Ambulatory Visit (INDEPENDENT_AMBULATORY_CARE_PROVIDER_SITE_OTHER): Payer: Medicaid Other | Admitting: Hematology

## 2013-06-27 ENCOUNTER — Other Ambulatory Visit: Payer: Medicaid Other | Admitting: *Deleted

## 2013-06-27 ENCOUNTER — Encounter: Payer: Self-pay | Admitting: Hematology

## 2013-06-27 DIAGNOSIS — D571 Sickle-cell disease without crisis: Secondary | ICD-10-CM

## 2013-06-27 NOTE — Progress Notes (Signed)
Patient ID: Joan Martin, female   DOB: 1986-06-10, 27 y.o.   MRN: [id number] Flu shot given by Lanae Boast RN to left deltoid without problems.

## 2013-06-28 LAB — CBC WITH DIFFERENTIAL/PLATELET
Eosinophils Absolute: 0 10*3/uL (ref 0.0–0.7)
Lymphs Abs: 17 10*3/uL — ABNORMAL HIGH (ref 0.7–4.0)
MCH: 40.2 pg — ABNORMAL HIGH (ref 26.0–34.0)
Neutro Abs: 20 10*3/uL — ABNORMAL HIGH (ref 1.7–7.7)
Neutrophils Relative %: 44 % (ref 43–77)
Platelets: 228 10*3/uL (ref 150–400)
RBC: 2.46 MIL/uL — ABNORMAL LOW (ref 3.87–5.11)
WBC: 4.5 10*3/uL (ref 4.0–10.5)

## 2013-07-22 ENCOUNTER — Encounter (HOSPITAL_COMMUNITY): Payer: Self-pay | Admitting: *Deleted

## 2013-07-22 ENCOUNTER — Emergency Department (HOSPITAL_COMMUNITY)
Admission: EM | Admit: 2013-07-22 | Discharge: 2013-07-23 | Disposition: A | Discharge status: Home / Self Care | Payer: Medicaid Other | Attending: Emergency Medicine | Admitting: Emergency Medicine

## 2013-07-22 DIAGNOSIS — Z79899 Other long term (current) drug therapy: Secondary | ICD-10-CM | POA: Insufficient documentation

## 2013-07-22 DIAGNOSIS — D5700 Hb-SS disease with crisis, unspecified: Secondary | ICD-10-CM

## 2013-07-22 DIAGNOSIS — IMO0002 Reserved for concepts with insufficient information to code with codable children: Secondary | ICD-10-CM | POA: Insufficient documentation

## 2013-07-22 DIAGNOSIS — J45909 Unspecified asthma, uncomplicated: Secondary | ICD-10-CM | POA: Insufficient documentation

## 2013-07-22 DIAGNOSIS — Z8673 Personal history of transient ischemic attack (TIA), and cerebral infarction without residual deficits: Secondary | ICD-10-CM | POA: Insufficient documentation

## 2013-07-22 LAB — BASIC METABOLIC PANEL
BUN: 10 mg/dL (ref 6–23)
Calcium: 8.8 mg/dL (ref 8.4–10.5)
GFR calc non Af Amer: >90 mL/min (ref >90)
Glucose, Bld: 93 mg/dL (ref 70–99)

## 2013-07-22 LAB — CBC WITH DIFFERENTIAL/PLATELET
Basophils Absolute: 0.3 K/uL — ABNORMAL HIGH (ref 0.0–0.1)
Basophils Relative: 3 % — ABNORMAL HIGH (ref 0–1)
Eosinophils Absolute: 0.1 K/uL (ref 0.0–0.7)
Eosinophils Relative: 1 % (ref 0–5)
HCT: 24.6 % — ABNORMAL LOW (ref 36.0–46.0)
Hemoglobin: 9.3 g/dL — ABNORMAL LOW (ref 12.0–15.0)
Lymphocytes Relative: 26 % (ref 12–46)
Lymphs Abs: 2.5 K/uL (ref 0.7–4.0)
MCH: 39.4 pg — ABNORMAL HIGH (ref 26.0–34.0)
MCHC: 37.8 g/dL — ABNORMAL HIGH (ref 30.0–36.0)
MCV: 104.2 fL — ABNORMAL HIGH (ref 78.0–100.0)
Monocytes Absolute: 1.3 K/uL — ABNORMAL HIGH (ref 0.1–1.0)
Monocytes Relative: 14 % — ABNORMAL HIGH (ref 3–12)
Neutro Abs: 5.4 K/uL (ref 1.7–7.7)
Neutrophils Relative %: 56 % (ref 43–77)
Platelets: 220 K/uL (ref 150–400)
RBC: 2.36 MIL/uL — ABNORMAL LOW (ref 3.87–5.11)
RDW: 21.1 % — ABNORMAL HIGH (ref 11.5–15.5)
WBC: 9.6 K/uL (ref 4.0–10.5)

## 2013-07-22 LAB — RETICULOCYTES: Retic Ct Pct: 19 % — ABNORMAL HIGH (ref 0.4–3.1)

## 2013-07-22 MED ORDER — DIPHENHYDRAMINE HCL 50 MG/ML IJ SOLN
25.0000 mg | Freq: Once | INTRAMUSCULAR | Status: AC
  Administered 2013-07-22: 25 mg via INTRAVENOUS
  Filled 2013-07-22: qty 1

## 2013-07-22 MED ORDER — SODIUM CHLORIDE 0.9 % IV BOLUS (SEPSIS)
1000.0000 mL | Freq: Once | INTRAVENOUS | Status: AC
  Administered 2013-07-22: 1000 mL via INTRAVENOUS

## 2013-07-22 MED ORDER — ONDANSETRON HCL 4 MG/2ML IJ SOLN
4.0000 mg | Freq: Once | INTRAMUSCULAR | Status: AC
  Administered 2013-07-22: 4 mg via INTRAVENOUS
  Filled 2013-07-22: qty 2

## 2013-07-22 MED ORDER — HYDROMORPHONE HCL 2 MG PO TABS
2.0000 mg | ORAL_TABLET | Freq: Once | ORAL | Status: AC
  Administered 2013-07-22: 2 mg via ORAL
  Filled 2013-07-22: qty 1

## 2013-07-22 MED ORDER — HYDROMORPHONE HCL PF 2 MG/ML IJ SOLN
2.0000 mg | Freq: Once | INTRAMUSCULAR | Status: AC
  Administered 2013-07-22: 2 mg via INTRAVENOUS
  Filled 2013-07-22: qty 1

## 2013-07-22 NOTE — ED Provider Notes (Signed)
CSN: [id number]     Arrival date & time 07/22/13  2001 History   First MD Initiated Contact with Patient 07/22/13 2052     Chief Complaint  Patient presents with  . Sickle Cell Pain Crisis   (Consider location/radiation/quality/duration/timing/severity/associated sxs/prior Treatment) HPI Comments: Patient is a 27 year old female with history of sickle cell anemia who presents today with worsening back and leg pain since yesterday. The pain is sharp and stabbing. She reports the same pain as her sickle cell crisis pain, although she states she has not taken this much pain since 2012. She has taken one Percocet at home which did not help her pain. She reports that she only took one because she had to go to work. 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 She specifically denies chest pain or shortness of breath. No bowel or bladder incontinence, history of cancer, drug abuse. She has required transfusions in the past. She seen at the sickle cell clinic.  The history is provided by the patient. No language interpreter was used.    Past Medical History  Diagnosis Date  . Sickle cell anemia   . Stroke     7 years ago  . Sickle cell anemia   . Asthma    Past Surgical History  Procedure Laterality Date  . Cholecystectomy     Family History  Problem Relation Age of Onset  . Diabetes Mother   . Hypertension Mother   . Diabetes Father   . Hypertension Father   . Cancer Maternal Aunt    History  Substance Use Topics  . Smoking status: Never Smoker   . Smokeless tobacco: Not on file  . Alcohol Use: No   OB History   Grav Para Term Preterm Abortions TAB SAB Ect Mult Living                 Review of Systems  Constitutional: Negative for fever and chills.  Respiratory: Negative for shortness of breath.   Cardiovascular: Negative for chest pain.  Gastrointestinal: Negative for nausea, vomiting and abdominal pain.  Musculoskeletal: Positive for myalgias, back pain, joint swelling,  arthralgias and gait problem.  All other systems reviewed and are negative.    Allergies  Cephalosporins; Citrus; and Morphine and related  Home Medications   Current Outpatient Rx  Name  Route  Sig  Dispense  Refill  . cholecalciferol (VITAMIN D) 1000 UNITS tablet   Oral   Take 1,000 Units by mouth daily.         . diphenhydrAMINE (BENADRYL) 25 MG tablet   Oral   Take 25 mg by mouth every 6 (six) hours as needed for itching.         . Fluticasone-Salmeterol (ADVAIR) 250-50 MCG/DOSE AEPB   Inhalation   Inhale 1 puff into the lungs daily.         . folic acid (FOLVITE) 1 MG tablet   Oral   Take 1 tablet (1 mg total) by mouth daily.   30 tablet   3   . hydroxyurea (HYDREA) 500 MG capsule   Oral   Take 500 mg by mouth 2 (two) times daily. May take with food to minimize GI side effects.         . mometasone (NASONEX) 50 MCG/ACT nasal spray   Nasal   Place 2 sprays into the nose daily.         . ondansetron (ZOFRAN) 4 MG tablet   Oral   Take 1 tablet (4  mg total) by mouth every 8 (eight) hours as needed for nausea.   15 tablet   0     VO given called in   . oxyCODONE-acetaminophen (PERCOCET) 10-325 MG per tablet   Oral   Take 1 tablet by mouth every 6 (six) hours as needed for pain.   60 tablet   0   . promethazine (PHENERGAN) 25 MG tablet   Oral   Take 25 mg by mouth every 6 (six) hours as needed for nausea.         . traMADol (ULTRAM) 50 MG tablet   Oral   Take 1 tablet (50 mg total) by mouth every 8 (eight) hours as needed for pain.   15 tablet   0    BP 114/63  Pulse 111  Temp(Src) 98.7 F (37.1 C) (Oral)  Resp 18  SpO2 90%  LMP 07/07/2013 Physical Exam  Nursing note and vitals reviewed. Constitutional: She is oriented to person, place, and time. She appears well-developed and well-nourished. She appears distressed.  HENT:  Head: Normocephalic and atraumatic.  Right Ear: External ear normal.  Left Ear: External ear normal.  Nose:  Nose normal.  Mouth/Throat: Oropharynx is clear and moist.  Eyes: Conjunctivae are normal.  Neck: Normal range of motion.  Cardiovascular: Normal rate, regular rhythm, normal heart sounds, intact distal pulses and normal pulses.   Pulmonary/Chest: Effort normal and breath sounds normal. No stridor. No respiratory distress. She has no wheezes. She has no rales.  Abdominal: Soft. She exhibits no distension.  Musculoskeletal: Normal range of motion.  Exquisitely tender to palpation of legs and feet bilaterally. No swelling, erythema.  Neurological: She is alert and oriented to person, place, and time. She has normal strength. No sensory deficit. She exhibits normal muscle tone. Coordination normal.  Neurovascularly intact  Skin: Skin is warm and dry. She is not diaphoretic. No erythema.  Psychiatric: She has a normal mood and affect. Her behavior is normal.    ED Course  Procedures (including critical care time) Labs Review Labs Reviewed  CBC WITH DIFFERENTIAL - Abnormal; Notable for the following:    RBC 2.36 (*)    Hemoglobin 9.3 (*)    HCT 24.6 (*)    MCV 104.2 (*)    MCH 39.4 (*)    MCHC 37.8 (*)    RDW 21.1 (*)    Monocytes Relative 14 (*)    Basophils Relative 3 (*)    Monocytes Absolute 1.3 (*)    Basophils Absolute 0.3 (*)    All other components within normal limits  RETICULOCYTES - Abnormal; Notable for the following:    Retic Ct Pct 19.0 (*)    RBC. 2.36 (*)    Retic Count, Manual 448.4 (*)    All other components within normal limits  BASIC METABOLIC PANEL - Abnormal; Notable for the following:    Sodium 133 (*)    All other components within normal limits   Imaging Review No results found.  MDM   1. Sickle cell pain crisis    Patient presents with sickle cell pain. She is mildly anemic around her baseline with high retic count. After 3 mg of IV Dilaudid and 2 mg of by mouth Dilaudid patient feels significantly improved and would like to go home. She denies  any sort of chest pain or shortness of breath. She is currently at her baseline pain. Patient will followup with Dr. Ashley Royalty. I did refill her Zofran as she is running out.  Discussed methods to take her pain medication orally at home without vomiting. This included taking her nausea medicine prior to taking her pain medicine.   Just prior to discharge it was seen that patient's oxygen saturation decreased from 98% on room air to 90%. Patient was ambulated in the hall and de-sated to 87%. Patient was placed on oxygen. Patient continues to deny chest pain and shortness of breath. Chest x ray is pending. Patient signed out to Lawyer, PA-C at change of shift. Vital signs stable at this time.    Mora Bellman, PA-C 07/23/13 [phone]

## 2013-07-22 NOTE — ED Notes (Signed)
Patient states that pain began yesterday in lower back shooting down both legs. Patient states she took percocet at home with minimal relief while working.

## 2013-07-23 ENCOUNTER — Emergency Department (HOSPITAL_COMMUNITY): Payer: Medicaid Other

## 2013-07-23 LAB — BLOOD GAS, ARTERIAL
Acid-base deficit: 4.3 mmol/L — ABNORMAL HIGH (ref 0.0–2.0)
Bicarbonate: 20.3 mEq/L (ref 20.0–24.0)
Drawn by: 31288
FIO2: 0.21 %
O2 Saturation: 82.4 %
Patient temperature: 98
TCO2: 19.4 mmol/L (ref 0–100)
pCO2 arterial: 36.8 mmHg (ref 35.0–45.0)
pH, Arterial: 7.358 (ref 7.350–7.450)
pO2, Arterial: 54.8 mmHg — ABNORMAL LOW (ref 80.0–100.0)

## 2013-07-23 MED ORDER — ONDANSETRON HCL 4 MG PO TABS: 4.0000 mg | ORAL_TABLET | Freq: Four times a day (QID) | ORAL | Status: DC

## 2013-07-23 MED ORDER — DIPHENHYDRAMINE HCL 50 MG/ML IJ SOLN
25.0000 mg | Freq: Once | INTRAMUSCULAR | Status: AC
  Administered 2013-07-23: 25 mg via INTRAVENOUS
  Filled 2013-07-23: qty 1

## 2013-07-23 MED ORDER — HYDROMORPHONE HCL PF 1 MG/ML IJ SOLN
1.0000 mg | Freq: Once | INTRAMUSCULAR | Status: AC
  Administered 2013-07-23: 1 mg via INTRAVENOUS
  Filled 2013-07-23: qty 1

## 2013-07-23 NOTE — ED Provider Notes (Signed)
  Physical Exam  BP 112/79  Pulse 88  Temp(Src) 98 F (36.7 C) (Oral)  Resp 16  SpO2 93%  LMP 07/14/2013  Physical Exam  ED Course  Procedures  Patient has been asymptomatic with her drops in pulse oximetry readings.  Patient has no complaints of shortness of breath.  I spoke with the, [facility] Harvette Lovell Sheehan and reviewed the case with her and she felt that if she's asymptomatic that the patient was most likely developing a more chronic issue with her breathing, and respiratory status.  Patient be advised to follow up with her primary care, Dr. she's told to return here as needed      Carlyle Dolly, PA-C 07/23/13 [phone]

## 2013-07-23 NOTE — ED Provider Notes (Signed)
Medical screening examination/treatment/procedure(s) were conducted as a shared visit with non-physician practitioner(s) and myself.  I personally evaluated the patient during the encounter   Date: 07/23/2013  Rate: 86  Rhythm: normal sinus rhythm  QRS Axis: normal  Intervals: normal  ST/T Wave abnormalities: nonspecific ST changes  Conduction Disutrbances:none  Narrative Interpretation:   Old EKG Reviewed: none available   LE and back pain typical of her sickle cell pains, resolved with IV medications. Labs and CXR reviewed. Initially tachycardic/ resolved with IVFs. Lungs CTA, heart RRR.  PT asymptomatic having some bouts of hypoxia on pulse ox monitoring,. ABG obtained/ reviewed MED consulted. DR Lovell Sheehan does not feel PT requires admit or further work up at thistime. PT want to go home. She agrees to close PCP follow up.   Sunnie Nielsen, MD 07/23/13 [phone]

## 2013-07-23 NOTE — ED Provider Notes (Signed)
Medical screening examination/treatment/procedure(s) were conducted as a shared visit with non-physician practitioner(s) and myself.  I personally evaluated the patient during the encounter  Sunnie Nielsen, MD 07/23/13 [phone]

## 2013-07-23 NOTE — ED Notes (Signed)
Pt ambulating to the bathroom without assistance

## 2013-07-23 NOTE — ED Notes (Signed)
Pulsox 87% while ambulating, pulse 107.

## 2013-07-27 ENCOUNTER — Encounter: Payer: Self-pay | Admitting: Primary Care

## 2013-07-27 ENCOUNTER — Ambulatory Visit (INDEPENDENT_AMBULATORY_CARE_PROVIDER_SITE_OTHER): Payer: Medicaid Other | Admitting: Primary Care

## 2013-07-27 VITALS — BP 114/65 | HR 75 | Temp 98.2°F | Resp 14 | Ht 68.25 in | Wt 131.5 lb

## 2013-07-27 DIAGNOSIS — D571 Sickle-cell disease without crisis: Secondary | ICD-10-CM

## 2013-07-27 MED ORDER — ONDANSETRON HCL 4 MG PO TABS: 4.0000 mg | ORAL_TABLET | Freq: Three times a day (TID) | ORAL | Status: DC | PRN

## 2013-07-27 MED ORDER — OXYCODONE-ACETAMINOPHEN 10-325 MG PO TABS: 1.0000 | ORAL_TABLET | Freq: Four times a day (QID) | ORAL | Status: DC | PRN

## 2013-07-27 MED ORDER — IBUPROFEN 600 MG PO TABS: 600.0000 mg | ORAL_TABLET | Freq: Three times a day (TID) | ORAL | Status: AC | PRN

## 2013-07-27 MED ORDER — FOLIC ACID 1 MG PO TABS: 1.0000 mg | ORAL_TABLET | Freq: Every day | ORAL | Status: DC

## 2013-07-27 NOTE — Progress Notes (Signed)
Patient ID: Joan Martin, female   DOB: 10-18-1986, 27 y.o.   MRN: [id number]   SICKLE CELL SERVICE PROGRESS NOTE  PCP: Marthann Schiller, MD 07/27/2013   Chief Complaint  Patient presents with  . Follow-up    post hosp.  . Medication Refill   Subjective: Joan Martin is in today after a ED visit on 07/22/13 in which she had pain not controlled by analgesic which became progressively. Pain was type sickle cell crisis located in lower back and legs.  Today she is in good spirits and painful despite her c/o care received in the ED. Pt states she has had her flu shot 06/2013. Today, I learn she is working 2 jobs expressed rest, hydration and taking care of self too.   ROS Review of Systems  Constitutional: Negative.  HENT: Negative.  Eyes: Negative.  Respiratory: Negative.  Cardiovascular: Negative.  Gastrointestinal: Negative.  Endocrine: Negative.  Genitourinary: Negative.  Musculoskeletal: Negative  Skin: Negative.  Allergic/Immunologic: Negative.  Neurological: Negative.  Hematological: Negative.  Psychiatric/Behavioral: Negative   Allergies  Allergen Reactions  . Cephalosporins     Possible family reaction  . Citrus Other (See Comments)    Severity varies/ Reaction varies hives around mouth to swelling around mouth and lips.   . Morphine And Related Hives    Patient states she can take IV dilaudid with benadryl     Outpatient Encounter Prescriptions as of 07/27/2013  Medication Sig Dispense Refill  . cholecalciferol (VITAMIN D) 1000 UNITS tablet Take 1,000 Units by mouth daily.      . diphenhydrAMINE (BENADRYL) 25 MG tablet Take 25 mg by mouth every 6 (six) hours as needed for itching.      . Fluticasone-Salmeterol (ADVAIR) 250-50 MCG/DOSE AEPB Inhale 1 puff into the lungs daily.      . folic acid (FOLVITE) 1 MG tablet Take 1 tablet (1 mg total) by mouth daily.  30 tablet  3  . hydroxyurea (HYDREA) 500 MG capsule Take 500 mg by mouth 2 (two) times daily. May take with  food to minimize GI side effects.      . mometasone (NASONEX) 50 MCG/ACT nasal spray Place 2 sprays into the nose daily.      . ondansetron (ZOFRAN) 4 MG tablet Take 1 tablet (4 mg total) by mouth every 8 (eight) hours as needed for nausea.  15 tablet  0  . ondansetron (ZOFRAN) 4 MG tablet Take 1 tablet (4 mg total) by mouth every 6 (six) hours.  12 tablet  0  . oxyCODONE-acetaminophen (PERCOCET) 10-325 MG per tablet Take 1 tablet by mouth every 6 (six) hours as needed for pain.  60 tablet  0  . promethazine (PHENERGAN) 25 MG tablet Take 25 mg by mouth every 6 (six) hours as needed for nausea.      . traMADol (ULTRAM) 50 MG tablet Take 1 tablet (50 mg total) by mouth every 8 (eight) hours as needed for pain.  15 tablet  0   Facility-Administered Encounter Medications as of 07/27/2013  Medication Dose Route Frequency Provider Last Rate Last Dose  . ondansetron (ZOFRAN) injection 4 mg  4 mg Intravenous Once Grayce Sessions, NP       Physical Exam   Filed Vitals:   07/27/13 1315  BP: 114/65  Pulse: 75  Temp: 98.2 F (36.8 C)  Resp: 14    General: Alert, awake, oriented x3, in no acute distress.  HEENT: [place]/AT PEERL, EOMI, mild icteric sclarea  Neck: Trachea midline,  no masses, no thyromegal,y no JVD, no carotid bruit OROPHARYNX:  Moist, No exudate/ erythema/lesions.  Heart: Regular rate and rhythm, without murmurs, rubs, gallops Lungs: Clear to auscultation, no wheezing or rhonchi noted. Abdomen: Soft, nontender, nondistended, positive bowel sounds Neuro:  cranial nerves II through XII grossly intact. . Musculoskeletal: No warm swelling or erythema around joints, no spinal tenderness noted. Psychiatric: Patient alert and oriented x3. Lymph node survey: No cervical  lymphadenopathy noted.   Assessment/Plan:  HbSS with out crisis: stable at this time she is compliant with her hydroxurea and folic acid. Also requesting refills on her folic acid. Discussed importance of hydration  onset, during and after crisis to drink 64 oz of water an hour.  Acute on chronic pain: increased ibuprofen to 600mg  Q 8hrs prn for inflammatory process associated with SCD. This will also allow her to manage her pain as adjunct therapy with her analgesic. Prescription given for Percocet 10/325 one Q 6 hrs prn # 60 Health maintenance:  she has had the influenza vaccine 06/2013  Past due for eye examination she will schedule appt soon    20% of time spent with the maintainace and care of her SCD   Gwinda Passe, NP-C

## 2013-08-04 ENCOUNTER — Telehealth: Payer: Self-pay | Admitting: *Deleted

## 2013-08-04 NOTE — Telephone Encounter (Signed)
Pt requested antibiotic for dental appt. Schedule for 08/09/13 at 10:30 am.

## 2013-08-08 NOTE — Telephone Encounter (Signed)
Called Joan Martin several times and was unable to reach her. She has no identifying information on her voicemail that confirms that this is her phone#. Thus I am unable to leave any meaningful information secondary to HIPPA. I did leave a message today asking her to call her Physician's office in response to her request.

## 2013-08-09 ENCOUNTER — Ambulatory Visit (INDEPENDENT_AMBULATORY_CARE_PROVIDER_SITE_OTHER): Payer: Medicaid Other | Admitting: Internal Medicine

## 2013-08-09 ENCOUNTER — Non-Acute Institutional Stay (HOSPITAL_COMMUNITY)
Admission: AD | Admit: 2013-08-09 | Discharge: 2013-08-09 | Disposition: A | Discharge status: Home / Self Care | Payer: Medicaid Other | Source: Ambulatory Visit | Attending: Internal Medicine | Admitting: Internal Medicine

## 2013-08-09 ENCOUNTER — Encounter: Payer: Self-pay | Admitting: Internal Medicine

## 2013-08-09 VITALS — BP 116/75 | HR 83 | Temp 98.2°F | Resp 14 | Ht 67.0 in | Wt 126.0 lb

## 2013-08-09 DIAGNOSIS — J209 Acute bronchitis, unspecified: Secondary | ICD-10-CM | POA: Insufficient documentation

## 2013-08-09 DIAGNOSIS — J019 Acute sinusitis, unspecified: Secondary | ICD-10-CM

## 2013-08-09 DIAGNOSIS — E860 Dehydration: Secondary | ICD-10-CM | POA: Insufficient documentation

## 2013-08-09 DIAGNOSIS — R110 Nausea: Secondary | ICD-10-CM | POA: Insufficient documentation

## 2013-08-09 MED ORDER — ONDANSETRON HCL 4 MG/2ML IJ SOLN: 4.0000 mg | Freq: Once | INTRAMUSCULAR | Status: AC

## 2013-08-09 MED ORDER — AZITHROMYCIN 250 MG PO TABS: ORAL_TABLET | ORAL | Status: DC

## 2013-08-09 MED ORDER — DEXTROSE 5 % IV SOLN
500.0000 mg | Freq: Once | INTRAVENOUS | Status: AC
  Administered 2013-08-09: 500 mg via INTRAVENOUS
  Filled 2013-08-09: qty 500

## 2013-08-09 MED ORDER — ONDANSETRON HCL 4 MG/2ML IJ SOLN
INTRAMUSCULAR | Status: AC
  Administered 2013-08-09: 4 mg
  Filled 2013-08-09: qty 2

## 2013-08-09 MED ORDER — DEXTROSE-NACL 5-0.45 % IV SOLN
INTRAVENOUS | Status: DC
  Administered 2013-08-09 (×2): via INTRAVENOUS

## 2013-08-09 NOTE — Progress Notes (Signed)
  Subjective:    Patient ID: Joan Martin, female    DOB: 11-02-1985, 27 y.o.   MRN: [id number]  HPI: Pt presents for acute visit with C/O sore throat, cough -non-productive and sinus tenderness x 3 days. Pt states that she started having rhinorrhea and cough 3 days ago and had a low grade fever of 99.5. She treated her fever with ibuprofen. She had minimal wheezing 3 days ago but that has resolved. 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   With regard to her Hb SS she has had no significant pain. However the patient does appear more jaundiced than her usual baseline.    Review of Systems  Constitutional: Negative.   HENT: Positive for dental problem, rhinorrhea, sinus pressure and sore throat. Negative for trouble swallowing and voice change.   Eyes: Negative.   Cardiovascular: Negative.   Gastrointestinal: Negative.   Endocrine: Negative.   Genitourinary: Negative.   Skin: Negative.   Allergic/Immunologic: Negative.   Neurological: Negative.   Hematological: Negative.   Psychiatric/Behavioral: Negative.        Objective:   Physical Exam  Constitutional: She is oriented to person, place, and time. She appears well-developed and well-nourished.  HENT:  Head: Normocephalic and atraumatic.  Eyes: Conjunctivae and EOM are normal. Pupils are equal, round, and reactive to light. Scleral icterus is present.  Neck: Normal range of motion. Neck supple. No JVD present. No thyromegaly present.  Cardiovascular: Normal rate and regular rhythm.  Exam reveals no gallop and no friction rub.   No murmur heard. Pulmonary/Chest: Effort normal and breath sounds normal. She has no wheezes. She has no rales.  Abdominal: Soft. Bowel sounds are normal. She exhibits no distension and no mass. There is no tenderness.  Musculoskeletal: Normal range of motion.  Lymphadenopathy:    She has no cervical adenopathy.  Neurological: She is alert and oriented to person, place, and time. No  cranial nerve deficit.  Skin: Skin is warm and dry.  Psychiatric: She has a normal mood and affect. Her behavior is normal. Judgment and thought content normal.          Assessment & Plan:  1. Acute Sinusitis and URI:  Will treat patient with azithromycin for 5 days.  2. dehydration: The patient is minimally dehydrated. I will give the patient IV hydration as the patient already appears to be having early signs of a vaso-occlusive episode  Patient to followup post treatment in 2 weeks

## 2013-08-09 NOTE — Progress Notes (Signed)
[facility]  Procedure Note  Joan Martin ZOX:[id number] DOB: June 19, 1986 DOA: 08/09/2013   PCP: MATTHEWS,MICHELLE A., MD   Associated Diagnosis: Acute Bronchitis, Acute Sinusitis, Dehydration  Procedure Note: Patient treatment with IV fluids and IV antibiotics.    Condition During Procedure: Tolerated well. C/o nausea, zofran given with improvement. Patient able to tolerate POs with no difficulty.    Condition at Discharge: Patient discharged home, VSS, Discharge instructions reviewed. Patient to call primary care office tomorrow morning to schedule follow -up appointment. Patient to leave day hospital with belongings ambulatory via self.    Allyn Kenner, RN  [facility]

## 2013-08-15 NOTE — Telephone Encounter (Signed)
Spoke with Ms. Laplant and advised no indication for antibiotics.

## 2013-08-16 ENCOUNTER — Telehealth: Payer: Self-pay | Admitting: Internal Medicine

## 2013-08-16 DIAGNOSIS — D571 Sickle-cell disease without crisis: Secondary | ICD-10-CM

## 2013-08-17 MED ORDER — OXYCODONE-ACETAMINOPHEN 10-325 MG PO TABS: 1.0000 | ORAL_TABLET | Freq: Four times a day (QID) | ORAL | Status: DC | PRN

## 2013-08-17 NOTE — Telephone Encounter (Signed)
Refilled Percocet 10/325 1 Q 6hrs prn # 60 last received 07/27/13

## 2013-09-12 ENCOUNTER — Other Ambulatory Visit: Payer: Self-pay | Admitting: Primary Care

## 2013-09-12 NOTE — Telephone Encounter (Signed)
Hydroxyurea refilled.  Next appoint is 09/26/13

## 2013-09-26 ENCOUNTER — Ambulatory Visit: Payer: Medicaid Other | Admitting: Internal Medicine

## 2013-10-05 ENCOUNTER — Ambulatory Visit (INDEPENDENT_AMBULATORY_CARE_PROVIDER_SITE_OTHER): Payer: Medicaid Other | Admitting: Internal Medicine

## 2013-10-05 ENCOUNTER — Encounter: Payer: Self-pay | Admitting: Internal Medicine

## 2013-10-05 ENCOUNTER — Telehealth: Payer: Self-pay | Admitting: *Deleted

## 2013-10-05 VITALS — BP 132/79 | HR 92 | Temp 98.5°F | Resp 16 | Ht 67.5 in | Wt 133.0 lb

## 2013-10-05 DIAGNOSIS — J209 Acute bronchitis, unspecified: Secondary | ICD-10-CM

## 2013-10-05 DIAGNOSIS — E559 Vitamin D deficiency, unspecified: Secondary | ICD-10-CM

## 2013-10-05 DIAGNOSIS — J069 Acute upper respiratory infection, unspecified: Secondary | ICD-10-CM

## 2013-10-05 DIAGNOSIS — D571 Sickle-cell disease without crisis: Secondary | ICD-10-CM

## 2013-10-05 LAB — CBC WITH DIFFERENTIAL/PLATELET
Basophils Absolute: 0.1 10*3/uL (ref 0.0–0.1)
Basophils Relative: 2 % — ABNORMAL HIGH (ref 0–1)
Eosinophils Absolute: 0.1 10*3/uL (ref 0.0–0.7)
Eosinophils Relative: 2 % (ref 0–5)
HCT: 27.5 % — ABNORMAL LOW (ref 36.0–46.0)
Hemoglobin: 9.9 g/dL — ABNORMAL LOW (ref 12.0–15.0)
Lymphs Abs: 2.3 10*3/uL (ref 0.7–4.0)
MCH: 41.1 pg — ABNORMAL HIGH (ref 26.0–34.0)
MCHC: 36 g/dL (ref 30.0–36.0)
MCV: 114.1 fL — ABNORMAL HIGH (ref 78.0–100.0)
Monocytes Absolute: 1.1 10*3/uL — ABNORMAL HIGH (ref 0.1–1.0)
Monocytes Relative: 16 % — ABNORMAL HIGH (ref 3–12)
RDW: 19.5 % — ABNORMAL HIGH (ref 11.5–15.5)

## 2013-10-05 MED ORDER — FOLIC ACID 1 MG PO TABS: 1.0000 mg | ORAL_TABLET | Freq: Every day | ORAL | Status: DC

## 2013-10-05 MED ORDER — OXYCODONE-ACETAMINOPHEN 10-325 MG PO TABS: 1.0000 | ORAL_TABLET | Freq: Four times a day (QID) | ORAL | Status: DC | PRN

## 2013-10-05 MED ORDER — AZITHROMYCIN 250 MG PO TABS: ORAL_TABLET | ORAL | Status: DC

## 2013-10-05 MED ORDER — PREDNISONE (PAK) 10 MG PO TABS: ORAL_TABLET | Freq: Every day | ORAL | Status: DC

## 2013-10-05 MED ORDER — HYDROCOD POLST-CHLORPHEN POLST 10-8 MG/5ML PO LQCR: 5.0000 mL | Freq: Two times a day (BID) | ORAL | Status: DC | PRN

## 2013-10-05 NOTE — Progress Notes (Signed)
   Subjective:    Patient ID: Joan Martin, female    DOB: 11/12/1985, 27 y.o.   MRN: [id number]  HPI:  Pt here  With c/o of cough x 2 weeks productive of greenish sputum for the last 2 days. Pt has also been having wheezing, no fevers or chills. No ear pain or rhinorrhea.or diarrhea.  Pt also had an episode of acute pain associated with Hb SS last week which lasted x 5 days. She reports that she was jaundiced and managed with percocet and increased fluids.   Pt feels fatigued since crisis last week.     Review of Systems  Constitutional: Positive for fatigue. Negative for fever.  HENT: Positive for sinus pressure and sore throat. Negative for ear pain.   Eyes: Negative.   Respiratory: Positive for cough (Productive of greenish sputum) and wheezing.   Cardiovascular: Negative.   Gastrointestinal: Negative.   Endocrine: Negative.   Genitourinary: Negative.   Musculoskeletal: Positive for myalgias.  Skin: Negative.   Allergic/Immunologic: Negative.   Neurological: Negative.   Hematological: Negative.   Psychiatric/Behavioral: Negative.        Objective:   Physical Exam  Constitutional: She is oriented to person, place, and time. She appears well-developed and well-nourished.  HENT:  Head: Normocephalic and atraumatic.  Eyes: Conjunctivae and EOM are normal. Pupils are equal, round, and reactive to light. No scleral icterus.  Neck: Normal range of motion. Neck supple. No JVD present. No thyromegaly present.  Cardiovascular: Normal rate and regular rhythm.  Exam reveals no gallop and no friction rub.   No murmur heard. Pulmonary/Chest: Effort normal. She has wheezes. She has no rales.  Abdominal: Soft. Bowel sounds are normal. She exhibits no distension and no mass. There is no tenderness.  Musculoskeletal: Normal range of motion.  Lymphadenopathy:    She has cervical adenopathy.  Neurological: She is alert and oriented to person, place, and time. No cranial nerve deficit.   Skin: Skin is warm and dry.  Psychiatric: She has a normal mood and affect. Her behavior is normal. Judgment and thought content normal.          Assessment & Plan:  1. Acute Bronchitis with URI: Azithromycin (Z-pak), Tussionex-PK for cough and prednisone. Continue albuterol PRN. Continue advair after prednisone completed.  2. Hb SS: Pt s/p crisis and c/o fatigue in setting of recent jaundice and crisis. Will check Hb today particularly in light of the use of Hydrea.  -continue folic acid -continue Hydrea  -continue percocet PRN ( prescription given #60).  3. Vitamin D deficiency: Continue vitamin D supplementation. Check Vitamin D levels today.  Labs: CBC with diff  RTC: 6 months or PRN

## 2013-10-05 NOTE — Telephone Encounter (Signed)
Pt called she is at the pharmacy cough meds is $70.00 would like something else

## 2013-10-06 ENCOUNTER — Telehealth: Payer: Self-pay | Admitting: *Deleted

## 2013-10-06 ENCOUNTER — Other Ambulatory Visit: Payer: Self-pay | Admitting: Internal Medicine

## 2013-10-06 NOTE — Telephone Encounter (Signed)
Pt informed to take Z-pack first and to call for Prednisone.

## 2013-10-06 NOTE — Telephone Encounter (Signed)
Pt has questions on which medication to take Prednisone or Z-pak

## 2013-10-06 NOTE — Telephone Encounter (Signed)
Pt to take Z-pak now. She is to keep prednisone on hand and call if she has chest tightness and/wheezing and I will instruct her on taking Prednisone. Please inform patient.   With regard to her cough. She can take robitussin OTC for now. Please check with her Pharmacy to see if she will be covered for Hycodan 5/1.5 q 4 hours PRN.

## 2013-10-06 NOTE — Telephone Encounter (Signed)
Spoke with pt. About which medications to take Z-pack or Prednisone and to call if she has any chest tighteness or wheezing. Pt also asked about cough medications told ok to take Robitussin.

## 2013-10-07 ENCOUNTER — Telehealth: Payer: Self-pay | Admitting: *Deleted

## 2013-10-07 ENCOUNTER — Other Ambulatory Visit: Payer: Self-pay | Admitting: Internal Medicine

## 2013-10-07 NOTE — Telephone Encounter (Signed)
Called pharmacy to see if Hycodan 5 was covered by medicaid per pharmacist need Rx in order to check.

## 2013-11-02 ENCOUNTER — Other Ambulatory Visit: Payer: Self-pay | Admitting: Internal Medicine

## 2013-12-01 ENCOUNTER — Non-Acute Institutional Stay (HOSPITAL_COMMUNITY)
Admission: AD | Admit: 2013-12-01 | Discharge: 2013-12-01 | Disposition: A | Discharge status: Home / Self Care | Payer: Medicaid Other | Source: Ambulatory Visit | Attending: Internal Medicine | Admitting: Internal Medicine

## 2013-12-01 ENCOUNTER — Encounter: Payer: Self-pay | Admitting: Internal Medicine

## 2013-12-01 ENCOUNTER — Ambulatory Visit (INDEPENDENT_AMBULATORY_CARE_PROVIDER_SITE_OTHER): Payer: Medicaid Other | Admitting: Internal Medicine

## 2013-12-01 ENCOUNTER — Telehealth (HOSPITAL_COMMUNITY): Payer: Self-pay | Admitting: *Deleted

## 2013-12-01 VITALS — BP 132/86 | HR 98 | Temp 98.5°F | Resp 20 | Ht 67.0 in | Wt 132.0 lb

## 2013-12-01 DIAGNOSIS — J209 Acute bronchitis, unspecified: Secondary | ICD-10-CM

## 2013-12-01 DIAGNOSIS — M549 Dorsalgia, unspecified: Secondary | ICD-10-CM | POA: Insufficient documentation

## 2013-12-01 DIAGNOSIS — J019 Acute sinusitis, unspecified: Secondary | ICD-10-CM

## 2013-12-01 DIAGNOSIS — E860 Dehydration: Secondary | ICD-10-CM

## 2013-12-01 DIAGNOSIS — D5700 Hb-SS disease with crisis, unspecified: Secondary | ICD-10-CM

## 2013-12-01 DIAGNOSIS — Z79899 Other long term (current) drug therapy: Secondary | ICD-10-CM | POA: Insufficient documentation

## 2013-12-01 DIAGNOSIS — M79609 Pain in unspecified limb: Secondary | ICD-10-CM | POA: Insufficient documentation

## 2013-12-01 LAB — CBC WITH DIFFERENTIAL/PLATELET
BAND NEUTROPHILS: 0 % (ref 0–10)
BASOS ABS: 0 10*3/uL (ref 0.0–0.1)
BASOS PCT: 0 % (ref 0–1)
Blasts: 0 %
Eosinophils Absolute: 0 10*3/uL (ref 0.0–0.7)
Eosinophils Relative: 0 % (ref 0–5)
HCT: 26.4 % — ABNORMAL LOW (ref 36.0–46.0)
HEMOGLOBIN: 9.9 g/dL — AB (ref 12.0–15.0)
LYMPHS ABS: 2.4 10*3/uL (ref 0.7–4.0)
LYMPHS PCT: 39 % (ref 12–46)
MCH: 41.3 pg — ABNORMAL HIGH (ref 26.0–34.0)
MCHC: 37.5 g/dL — ABNORMAL HIGH (ref 30.0–36.0)
MCV: 110 fL — ABNORMAL HIGH (ref 78.0–100.0)
MONOS PCT: 15 % — AB (ref 3–12)
Metamyelocytes Relative: 0 %
Monocytes Absolute: 0.9 10*3/uL (ref 0.1–1.0)
Myelocytes: 0 %
NEUTROS ABS: 2.8 10*3/uL (ref 1.7–7.7)
NEUTROS PCT: 46 % (ref 43–77)
Platelets: 213 10*3/uL (ref 150–400)
Promyelocytes Absolute: 0 %
RBC: 2.4 MIL/uL — ABNORMAL LOW (ref 3.87–5.11)
RDW: 18.6 % — ABNORMAL HIGH (ref 11.5–15.5)
WBC: 6.1 10*3/uL (ref 4.0–10.5)
nRBC: 12 /100 WBC — ABNORMAL HIGH

## 2013-12-01 LAB — COMPREHENSIVE METABOLIC PANEL
ALBUMIN: 3.1 g/dL — AB (ref 3.5–5.2)
ALT: 42 U/L — ABNORMAL HIGH (ref 0–35)
AST: 120 U/L — AB (ref 0–37)
Alkaline Phosphatase: 384 U/L — ABNORMAL HIGH (ref 39–117)
BUN: 6 mg/dL (ref 6–23)
CALCIUM: 8.8 mg/dL (ref 8.4–10.5)
CO2: 18 mEq/L — ABNORMAL LOW (ref 19–32)
CREATININE: 0.59 mg/dL (ref 0.50–1.10)
Chloride: 102 mEq/L (ref 96–112)
GFR calc Af Amer: >90 mL/min (ref >90)
GFR calc non Af Amer: >90 mL/min (ref >90)
Glucose, Bld: 109 mg/dL — ABNORMAL HIGH (ref 70–99)
Potassium: 4.1 mEq/L (ref 3.7–5.3)
Sodium: 135 mEq/L — ABNORMAL LOW (ref 137–147)
Total Bilirubin: 8.8 mg/dL — ABNORMAL HIGH (ref 0.3–1.2)
Total Protein: 7.9 g/dL (ref 6.0–8.3)

## 2013-12-01 LAB — RETICULOCYTES
RBC.: 2.4 MIL/uL — ABNORMAL LOW (ref 3.87–5.11)
RETIC COUNT ABSOLUTE: 379.2 10*3/uL — AB (ref 19.0–186.0)
RETIC CT PCT: 15.8 % — AB (ref 0.4–3.1)

## 2013-12-01 MED ORDER — ZITHROMAX 250 MG PO TABS: 250.0000 mg | ORAL_TABLET | Freq: Every day | ORAL | Status: DC

## 2013-12-01 MED ORDER — ALBUTEROL SULFATE (2.5 MG/3ML) 0.083% IN NEBU: 2.5000 mg | INHALATION_SOLUTION | Freq: Four times a day (QID) | RESPIRATORY_TRACT | Status: AC | PRN

## 2013-12-01 MED ORDER — DEXTROSE-NACL 5-0.45 % IV SOLN
INTRAVENOUS | Status: DC
  Administered 2013-12-01: 10:00:00 via INTRAVENOUS

## 2013-12-01 MED ORDER — ALBUTEROL SULFATE (2.5 MG/3ML) 0.083% IN NEBU: 2.5000 mg | INHALATION_SOLUTION | Freq: Four times a day (QID) | RESPIRATORY_TRACT | Status: DC | PRN

## 2013-12-01 MED ORDER — ONDANSETRON HCL 4 MG/2ML IJ SOLN
4.0000 mg | Freq: Once | INTRAMUSCULAR | Status: DC
  Filled 2013-12-01: qty 2

## 2013-12-01 MED ORDER — AZITHROMYCIN 500 MG PO TABS
500.0000 mg | ORAL_TABLET | Freq: Every day | ORAL | Status: AC
  Administered 2013-12-01: 500 mg via ORAL
  Filled 2013-12-01: qty 1

## 2013-12-01 MED ORDER — ONDANSETRON HCL 4 MG/2ML IJ SOLN
4.0000 mg | Freq: Once | INTRAMUSCULAR | Status: AC
  Administered 2013-12-01: 4 mg via INTRAVENOUS
  Filled 2013-12-01: qty 2

## 2013-12-01 MED ORDER — HYDROMORPHONE HCL PF 2 MG/ML IJ SOLN
1.0000 mg | Freq: Once | INTRAMUSCULAR | Status: AC
  Administered 2013-12-01: 1 mg via INTRAVENOUS
  Filled 2013-12-01: qty 1

## 2013-12-01 MED ORDER — ALBUTEROL SULFATE HFA 108 (90 BASE) MCG/ACT IN AERS: 2.0000 | INHALATION_SPRAY | Freq: Four times a day (QID) | RESPIRATORY_TRACT | Status: DC | PRN

## 2013-12-01 MED ORDER — DEXTROSE 5 % IV SOLN
500.0000 mg | Freq: Once | INTRAVENOUS | Status: AC
  Administered 2013-12-01: 500 mg via INTRAVENOUS
  Filled 2013-12-01: qty 500

## 2013-12-01 MED ORDER — KETOROLAC TROMETHAMINE 30 MG/ML IJ SOLN
30.0000 mg | Freq: Four times a day (QID) | INTRAMUSCULAR | Status: DC
  Administered 2013-12-01 (×2): 30 mg via INTRAVENOUS
  Filled 2013-12-01 (×2): qty 1

## 2013-12-01 MED ORDER — HYDROMORPHONE HCL PF 2 MG/ML IJ SOLN: 1.5000 mg | Freq: Once | INTRAMUSCULAR | Status: DC

## 2013-12-01 MED ORDER — DIPHENHYDRAMINE HCL 25 MG PO CAPS
25.0000 mg | ORAL_CAPSULE | Freq: Once | ORAL | Status: AC
  Administered 2013-12-01: 25 mg via ORAL
  Filled 2013-12-01 (×2): qty 1

## 2013-12-01 MED ORDER — HYDROMORPHONE HCL PF 2 MG/ML IJ SOLN
1.2500 mg | Freq: Once | INTRAMUSCULAR | Status: AC
  Administered 2013-12-01: 1.25 mg via INTRAVENOUS
  Filled 2013-12-01: qty 1

## 2013-12-01 MED ORDER — FOLIC ACID 1 MG PO TABS
1.0000 mg | ORAL_TABLET | Freq: Every day | ORAL | Status: DC
Start: 2013-12-01 — End: 2013-12-01
  Administered 2013-12-01: 1 mg via ORAL
  Filled 2013-12-01: qty 1

## 2013-12-01 MED ORDER — PROMETHAZINE HCL 25 MG PO TABS
12.5000 mg | ORAL_TABLET | Freq: Four times a day (QID) | ORAL | Status: DC | PRN
  Administered 2013-12-01: 12.5 mg via ORAL
  Filled 2013-12-01: qty 1

## 2013-12-01 MED ORDER — ONDANSETRON HCL 4 MG/2ML IJ SOLN
4.0000 mg | Freq: Once | INTRAMUSCULAR | Status: AC
  Administered 2013-12-01: 4 mg via INTRAVENOUS

## 2013-12-01 NOTE — Discharge Instructions (Signed)
Acute Bronchitis Bronchitis is inflammation of the airways that extend from the windpipe into the lungs (bronchi). The inflammation often causes mucus to develop. This leads to a cough, which is the most common symptom of bronchitis.  In acute bronchitis, the condition usually develops suddenly and goes away over time, usually in a couple weeks. Smoking, allergies, and asthma can make bronchitis worse. Repeated episodes of bronchitis may cause further lung problems.  CAUSES Acute bronchitis is most often caused by the same virus that causes a cold. The virus can spread from person to person (contagious).  SIGNS AND SYMPTOMS   Cough.   Fever.   Coughing up mucus.   Body aches.   Chest congestion.   Chills.   Shortness of breath.   Sore throat.  DIAGNOSIS  Acute bronchitis is usually diagnosed through a physical exam. Tests, such as chest X-rays, are sometimes done to rule out other conditions.  TREATMENT  Acute bronchitis usually goes away in a couple weeks. Often times, no medical treatment is necessary. Medicines are sometimes given for relief of fever or cough. Antibiotics are usually not needed but may be prescribed in certain situations. In some cases, an inhaler may be recommended to help reduce shortness of breath and control the cough. A cool mist vaporizer may also be used to help thin bronchial secretions and make it easier to clear the chest.  HOME CARE INSTRUCTIONS  Get plenty of rest.   Drink enough fluids to keep your urine clear or pale yellow (unless you have a medical condition that requires fluid restriction). Increasing fluids may help thin your secretions and will prevent dehydration.   Only take over-the-counter or prescription medicines as directed by your health care provider.   Avoid smoking and secondhand smoke. Exposure to cigarette smoke or irritating chemicals will make bronchitis worse. If you are a smoker, consider using nicotine gum or skin  patches to help control withdrawal symptoms. Quitting smoking will help your lungs heal faster.   Reduce the chances of another bout of acute bronchitis by washing your hands frequently, avoiding people with cold symptoms, and trying not to touch your hands to your mouth, nose, or eyes.   Follow up with your health care provider as directed.  SEEK MEDICAL CARE IF: Your symptoms do not improve after 1 week of treatment.  SEEK IMMEDIATE MEDICAL CARE IF:  You develop an increased fever or chills.   You have chest pain.   You have severe shortness of breath.  You have bloody sputum.   You develop dehydration.  You develop fainting.  You develop repeated vomiting.  You develop a severe headache. MAKE SURE YOU:   Understand these instructions.  Will watch your condition.  Will get help right away if you are not doing well or get worse. Document Released: 11/13/2004 Document Revised: 06/08/2013 Document Reviewed: 03/29/2013 [facility] Patient Information 2014 [place], [place]. Dehydration, Adult Dehydration is when you lose more fluids from the body than you take in. Vital organs like the kidneys, brain, and heart cannot function without a proper amount of fluids and salt. Any loss of fluids from the body can cause dehydration.  CAUSES   Vomiting.  Diarrhea.  Excessive sweating.  Excessive urine output.  Fever. SYMPTOMS  Mild dehydration  Thirst.  Dry lips.  Slightly dry mouth. Moderate dehydration  Very dry mouth.  Sunken eyes.  Skin does not bounce back quickly when lightly pinched and released.  Dark urine and decreased urine production.  Decreased tear  production.  Headache. Severe dehydration  Very dry mouth.  Extreme thirst.  Rapid, weak pulse (more than 100 beats per minute at rest).  Cold hands and feet.  Not able to sweat in spite of heat and temperature.  Rapid breathing.  Blue lips.  Confusion and lethargy.  Difficulty being  awakened.  Minimal urine production.  No tears. DIAGNOSIS  Your caregiver will diagnose dehydration based on your symptoms and your exam. Blood and urine tests will help confirm the diagnosis. The diagnostic evaluation should also identify the cause of dehydration. TREATMENT  Treatment of mild or moderate dehydration can often be done at home by increasing the amount of fluids that you drink. It is best to drink small amounts of fluid more often. Drinking too much at one time can make vomiting worse. Refer to the home care instructions below. Severe dehydration needs to be treated at the hospital where you will probably be given intravenous (IV) fluids that contain water and electrolytes. HOME CARE INSTRUCTIONS   Ask your caregiver about specific rehydration instructions.  Drink enough fluids to keep your urine clear or pale yellow.  Drink small amounts frequently if you have nausea and vomiting.  Eat as you normally do.  Avoid:  Foods or drinks high in sugar.  Carbonated drinks.  Juice.  Extremely hot or cold fluids.  Drinks with caffeine.  Fatty, greasy foods.  Alcohol.  Tobacco.  Overeating.  Gelatin desserts.  Wash your hands well to avoid spreading bacteria and viruses.  Only take over-the-counter or prescription medicines for pain, discomfort, or fever as directed by your caregiver.  Ask your caregiver if you should continue all prescribed and over-the-counter medicines.  Keep all follow-up appointments with your caregiver. SEEK MEDICAL CARE IF:  You have abdominal pain and it increases or stays in one area (localizes).  You have a rash, stiff neck, or severe headache.  You are irritable, sleepy, or difficult to awaken.  You are weak, dizzy, or extremely thirsty. SEEK IMMEDIATE MEDICAL CARE IF:   You are unable to keep fluids down or you get worse despite treatment.  You have frequent episodes of vomiting or diarrhea.  You have blood or green  matter (bile) in your vomit.  You have blood in your stool or your stool looks black and tarry.  You have not urinated in 6 to 8 hours, or you have only urinated a small amount of very dark urine.  You have a fever.  You faint. MAKE SURE YOU:   Understand these instructions.  Will watch your condition.  Will get help right away if you are not doing well or get worse. Document Released: 10/06/2005 Document Revised: 12/29/2011 Document Reviewed: 05/26/2011 [facility] Patient Information 2014 [place].

## 2013-12-01 NOTE — H&P (Signed)
[facility] History and Physical  Joan Martin ZOX:[id number] DOB: 11/23/1985 DOA: 12/01/2013   PCP: Jennifr Gaeta A., MD   Chief Complaint: Cough and body aches x 3 days  HPI:Pt presented to clinic today with c/o cough productive of scant greenish sputum and occasional wheezing occurring for last 2 days. She denies fevers and chills. She has had a decreased appetite but has been drinking fluids.  She also reports pain in back and legs consistent with pain of sickle cell crisis. Pt has taken her oral medications as prescribed, however they have not been effective. She rates pain as 8/10 and localized to the above areas and described as throbbing in nature. There is not radiation and she has no associated symptoms.     Review of Systems:  Constitutional: No weight loss, night sweats, Fevers, chills, fatigue.  HEENT: No headaches, dizziness, seizures, vision changes, difficulty swallowing,Tooth/dental problems,Sore throat, No sneezing, itching, ear ache, nasal congestion, post nasal drip,  Cardio-vascular: No chest pain, Orthopnea, PND, swelling in lower extremities, anasarca, dizziness, palpitations  GI: No heartburn, indigestion, abdominal pain, nausea, vomiting, diarrhea, change in bowel habits, loss of appetite  Resp: No shortness of breath with exertion or at rest. No excess mucus, No coughing up of blood.No change in color of mucus.No wheezing.No chest wall deformity  Skin: no rash or lesions.  GU: no dysuria, change in color of urine, no urgency or frequency. No flank pain.  Psych: No change in mood or affect. No depression or anxiety. No memory loss.    Past Medical History  Diagnosis Date  . Sickle cell anemia   . Stroke     7 years ago  . Sickle cell anemia   . Asthma    Past Surgical History  Procedure Laterality Date  . Cholecystectomy     Social History:  reports that she has never smoked. She does not have any smokeless tobacco history on file. She  reports that she does not drink alcohol or use illicit drugs.  Allergies  Allergen Reactions  . Cephalosporins     Possible family reaction  . Citrus Other (See Comments)    Severity varies/ Reaction varies hives around mouth to swelling around mouth and lips.   . Morphine And Related Hives    Patient states she can take IV dilaudid with benadryl    Family History  Problem Relation Age of Onset  . Diabetes Mother   . Hypertension Mother   . Diabetes Father   . Hypertension Father   . Cancer Maternal Aunt     Prior to Admission medications   Medication Sig Start Date End Date Taking? Authorizing Provider  cholecalciferol (VITAMIN D) 1000 UNITS tablet Take 1,000 Units by mouth daily.    Historical Provider, MD  diphenhydrAMINE (BENADRYL) 25 MG tablet Take 25 mg by mouth every 6 (six) hours as needed for itching.    Historical Provider, MD  Fluticasone-Salmeterol (ADVAIR) 250-50 MCG/DOSE AEPB Inhale 1 puff into the lungs daily.    Historical Provider, MD  folic acid (FOLVITE) 1 MG tablet Take 1 tablet (1 mg total) by mouth daily. 10/05/13   Altha HarmMichelle A Makailah Slavick, MD  hydroxyurea (HYDREA) 500 MG capsule TAKE ONE CAPSULE BY MOUTH TWICE DAILY 11/02/13   Altha HarmMichelle A Calvina Liptak, MD  ibuprofen (ADVIL,MOTRIN) 600 MG tablet Take 1 tablet (600 mg total) by mouth every 8 (eight) hours as needed for pain. 07/27/13   Grayce SessionsMichelle P Edwards, NP  mometasone (NASONEX) 50 MCG/ACT nasal spray Place 2  sprays into the nose daily.    Historical Provider, MD  ondansetron (ZOFRAN) 4 MG tablet Take 1 tablet (4 mg total) by mouth every 6 (six) hours. 07/23/13   Jamesetta Orleans Lawyer, PA-C  oxyCODONE-acetaminophen (PERCOCET) 10-325 MG per tablet Take 1 tablet by mouth every 6 (six) hours as needed for pain. 10/05/13   Altha Harm, MD  promethazine (PHENERGAN) 25 MG tablet Take 25 mg by mouth every 6 (six) hours as needed for nausea.    Historical Provider, MD   Physical Exam: There were no vitals filed for this  visit. BP 110/59  Pulse 80  Temp(Src) 98 F (36.7 C) (Oral)  Resp 16  Ht 5\' 7"  (1.702 m)  Wt 132 lb (59.875 kg)  BMI 20.67 kg/m2  SpO2 95%  LMP 11/16/2013  General Appearance:    Alert, cooperative, no distress, appears stated age  Head:    Normocephalic, without obvious abnormality, atraumatic  Eyes:    PERRL, conjunctiva/corneas clear, EOM's intact, fundi    benign, both eyes mildly icteric  Throat:   Lips, mucosa, and tongue normal; teeth and gums normal  Neck:   Supple, symmetrical, trachea midline, no adenopathy;    thyroid:  no enlargement/tenderness/nodules; no carotid   bruit or JVD  Back:     Symmetric, no curvature, ROM normal, no CVA tenderness  Lungs:     Clear to auscultation bilaterally, respirations unlabored  Chest Wall:    No tenderness or deformity   Heart:    Regular rate and rhythm, S1 and S2 normal, no murmur, rub   or gallop  Abdomen:     Soft, non-tender, bowel sounds active all four quadrants,    no masses, no organomegaly  Extremities:   Extremities normal, atraumatic, no cyanosis or edema  Pulses:   2+ and symmetric all extremities  Skin:   Skin color, texture, turgor normal, no rashes or lesions  Lymph nodes:   Cervical, supraclavicular, and axillary nodes normal  Neurologic:   CNII-XII intact, normal strength, sensation and reflexes    throughout    Labs on Admission:    Assessment/Plan: Active Problems: 1. Pt will be treated with initially treated with weight based rapid re-dosing IVF and Toradol . If pain is above 7/10 then Pt will be transitioned to a weight based (Dilaudid/Morpiine) PCA . Patient will be re-evaluated for pain in the context of function and relationship to baseline as care progresses.  2. Acute Bronchitis: Pt will be treated with Azithromycin therapy to be initiated today and completed as out patient orally x 4 days.    Time spend: 49 minutes Code Status: Full Code Family Communication: N/A/ Disposition Plan: Likely home  at completion of treatment.  Thea Holshouser A., MD  Pager [phone]  If 7PM-7AM, please contact night-coverage [email or web address] Password [email or web address] 12/01/2013, 10:14 AM

## 2013-12-01 NOTE — Progress Notes (Signed)
Patient ID: Joan Martin, female   DOB: 09/03/1986, 28 y.o.   MRN: [id number] IV removed without difficulty, discharge instructions given to patient, questions answered, prescription for Zithromax given and patient instructed to take first pill in the morning since she recvd IV dose here today.  Patient verbalizes understanding.  Patient states pain is at a 0/10 at time of discharge.

## 2013-12-01 NOTE — Progress Notes (Signed)
Patient report pain 4/10 after 1st dose dilaudid. Notified MD to clarify next dose to be given. To give next dose and reassess.

## 2013-12-01 NOTE — Telephone Encounter (Signed)
Dr. Ashley RoyaltyMatthews aware and to admit patient to day hospital. Dr. Mikeal HawthorneGarba aware.

## 2013-12-01 NOTE — Telephone Encounter (Signed)
Patient arrived to primary care office c/o bronchitis, productive cough, nasal congestion, headache, and back pain. Patient rating pain 4/10 off and on for past 2 weeks. Patient reports a little shortness of breathe but cannot breathe through nose due to congestion. Patient reports she is always nauseous but is able to tolerate food and liquids with no vomiting. Patient [facility] all other pertinent [facility]. Informed patient that this RN will notify MD on call and return with recommendations. Patient acknowledges.

## 2013-12-01 NOTE — Discharge Summary (Signed)
[facility] Discharge Summary   Physician Discharge Summary  Joan Martin ZOX:[id number] DOB: 1986-08-29 DOA: 12/01/2013  PCP: MATTHEWS,MICHELLE A., MD  Admit date: 12/01/2013 Discharge date: 12/01/2013  Discharge Diagnoses:  Sickle cell anemia with pain Acute bronchitis  Discharge Condition: Stable  Disposition: Home  Diet: Regular Wt Readings from Last 3 Encounters:  12/01/13 132 lb (59.875 kg)  12/01/13 132 lb (59.875 kg)  10/05/13 133 lb (60.328 kg)    History of present illness:  Pt seen in the office on 12/01/2013 with early acute crisis and ongoing cough. She tried conservative measures at home but presented with throbbing pain. Pain is localized to back and legs and she rates pain as 5/10. The pain is throbbing in nature, non-radiating and she is unable to identify any palliative or provocative features. Patient has been experiencing a productive cough for greater than 1 month. Patient states that cough is typically relieved by Albuterol inhaler, but home inhaler has expired. Patient reports ongoing sinus and chest congestion.    [facility] Course:  Pt was treated with IVF, Toradol, IV analgesics, IV Azithromycin 500 mg, albuterol inhaler, and anti-emetics. Pt will be discharged home on Azithromycin 250 mg daily for 4 days. Pain initially 5/10, discharged at 0/10.   Discharge Exam:  Filed Vitals:   12/01/13 1252  BP: 136/82  Pulse: 90  Temp: 97.7 F (36.5 C)  Resp: 18   Filed Vitals:   12/01/13 1010 12/01/13 1252  BP: 131/88 136/82  Pulse: 79 90  Temp: 97.8 F (36.6 C) 97.7 F (36.5 C)  TempSrc: Oral Oral  Resp: 18 18  Height: 5\' 7"  (1.702 m)   Weight: 132 lb (59.875 kg)   SpO2: 94% 93%   BP 136/82  Pulse 90  Temp(Src) 97.7 F (36.5 C) (Oral)  Resp 18  Ht 5\' 7"  (1.702 m)  Wt 132 lb (59.875 kg)  BMI 20.67 kg/m2  SpO2 93%  LMP 11/16/2013  General Appearance:    Alert, cooperative, no distress, appears stated age  Head:     Normocephalic, without obvious abnormality, atraumatic  Eyes:    PERRL, conjunctiva icteric, corneas clear, EOM's intact, fundi benign, both eyes  Ears:    Normal TM's and external ear canals, both ears  Nose:   Nares normal, septum midline, mucosa normal, no drainage    or sinus tenderness  Throat:   Lips, mucosa, and tongue normal; teeth and gums normal  Neck:   Supple, symmetrical, trachea midline, no adenopathy;    thyroid:  no enlargement/tenderness/nodules; no carotid   bruit or JVD  Back:     Symmetric, no curvature, ROM normal, no CVA tenderness  Chest Wall:    No tenderness or deformity     Extremities:   Extremities normal, atraumatic, no cyanosis or edema  Pulses:   2+ and symmetric all extremities  Skin:   Skin color, texture, turgor normal, no rashes or lesions  Lymph nodes:   Cervical, supraclavicular, and axillary nodes normal  Neurologic:   CNII-XII intact, normal strength, sensation and reflexes    throughout   Discharge Instructions   Future Appointments Provider Department Dept Phone   03/29/2014 9:30 AM [facility] [place] [facility] [phone]   04/06/2014 10:00 AM Altha Harm, MD [place] [facility] [phone]       Medication List    TAKE these medications       ZITHROMAX 250 MG tablet  Generic drug:  azithromycin  Take 1 tablet (250 mg total) by mouth daily.      ASK your doctor about these medications       cholecalciferol 1000 UNITS tablet  Commonly known as:  VITAMIN D  Take 1,000 Units by mouth daily.     diphenhydrAMINE 25 MG tablet  Commonly known as:  BENADRYL  Take 25 mg by mouth every 6 (six) hours as needed for itching.     Fluticasone-Salmeterol 250-50 MCG/DOSE Aepb  Commonly known as:  ADVAIR  Inhale 1 puff into the lungs daily.     folic acid 1 MG tablet  Commonly known as:  FOLVITE  Take 1 tablet (1 mg total) by mouth daily.     hydroxyurea 500 MG capsule  Commonly known as:  HYDREA  TAKE ONE  CAPSULE BY MOUTH TWICE DAILY     ibuprofen 600 MG tablet  Commonly known as:  ADVIL,MOTRIN  Take 1 tablet (600 mg total) by mouth every 8 (eight) hours as needed for pain.     mometasone 50 MCG/ACT nasal spray  Commonly known as:  NASONEX  Place 2 sprays into the nose daily.     ondansetron 4 MG tablet  Commonly known as:  ZOFRAN  Take 1 tablet (4 mg total) by mouth every 6 (six) hours.     oxyCODONE-acetaminophen 10-325 MG per tablet  Commonly known as:  PERCOCET  Take 1 tablet by mouth every 6 (six) hours as needed for pain.     promethazine 25 MG tablet  Commonly known as:  PHENERGAN  Take 25 mg by mouth every 6 (six) hours as needed for nausea.          The results of significant diagnostics from this hospitalization (including imaging, microbiology, ancillary and laboratory) are listed below for reference.    Significant Diagnostic Studies: No results found.  Microbiology: No results found for this or any previous visit (from the past 240 hour(s)).   Labs: Basic Metabolic Panel:  Recent Labs Lab 12/01/13 1021  NA 135*  K 4.1  CL 102  CO2 18*  GLUCOSE 109*  BUN 6  CREATININE 0.59  CALCIUM 8.8   Liver Function Tests:  Recent Labs Lab 12/01/13 1021  AST 120*  ALT 42*  ALKPHOS 384*  BILITOT 8.8*  PROT 7.9  ALBUMIN 3.1*   No results found for this basename: LIPASE, AMYLASE,  in the last 168 hours No results found for this basename: AMMONIA,  in the last 168 hours CBC:  Recent Labs Lab 12/01/13 1021  WBC 6.1  NEUTROABS 2.8  HGB 9.9*  HCT 26.4*  MCV 110.0*  PLT 213   Cardiac Enzymes: No results found for this basename: CKTOTAL, CKMB, CKMBINDEX, TROPONINI,  in the last 168 hours BNP: No components found with this basename: POCBNP,  CBG: No results found for this basename: GLUCAP,  in the last 168 hours Ferritin: No results found for this basename: FERRITIN,  in the last 168 hours  Time coordinating discharge: Greater than 30  minutes  Signed:  Margaretta Chittum M  12/01/2013, 4:54 PM   Patient ID: Joan Martin MRN: [id number] DOB/AGE: July 06, 1986 27 y.o.  Admit date: 12/01/2013 Discharge date: 12/01/2013  Primary Care Physician:  MATTHEWS,MICHELLE A., MD  Admission Diagnoses:  Sickle cell crisis with pain  Discharge Diagnoses:   Sickle cell crisis with pain  Discharge Medications:    Medication List    TAKE these medications       ZITHROMAX 250 MG tablet  Generic drug:  azithromycin  Take 1 tablet (250 mg total) by mouth daily.      ASK your doctor about these medications       cholecalciferol 1000 UNITS tablet  Commonly known as:  VITAMIN D  Take 1,000 Units by mouth daily.     diphenhydrAMINE 25 MG tablet  Commonly known as:  BENADRYL  Take 25 mg by mouth every 6 (six) hours as needed for itching.     Fluticasone-Salmeterol 250-50 MCG/DOSE Aepb  Commonly known as:  ADVAIR  Inhale 1 puff into the lungs daily.     folic acid 1 MG tablet  Commonly known as:  FOLVITE  Take 1 tablet (1 mg total) by mouth daily.     hydroxyurea 500 MG capsule  Commonly known as:  HYDREA  TAKE ONE CAPSULE BY MOUTH TWICE DAILY     ibuprofen 600 MG tablet  Commonly known as:  ADVIL,MOTRIN  Take 1 tablet (600 mg total) by mouth every 8 (eight) hours as needed for pain.     mometasone 50 MCG/ACT nasal spray  Commonly known as:  NASONEX  Place 2 sprays into the nose daily.     ondansetron 4 MG tablet  Commonly known as:  ZOFRAN  Take 1 tablet (4 mg total) by mouth every 6 (six) hours.     oxyCODONE-acetaminophen 10-325 MG per tablet  Commonly known as:  PERCOCET  Take 1 tablet by mouth every 6 (six) hours as needed for pain.     promethazine 25 MG tablet  Commonly known as:  PHENERGAN  Take 25 mg by mouth every 6 (six) hours as needed for nausea.         Consults:  None  Significant Diagnostic Studies:  No results found.  Disposition at Discharge: 01-Home or Self Care  Discharge  Orders:      Future Appointments Provider Department Dept Phone   03/29/2014 9:30 AM [facility] [place] [facility] [phone]   04/06/2014 10:00 AM Altha HarmMichelle A Matthews, MD [place] [facility] [phone]      Condition at Discharge:   Stable  Time spent on Discharge:  Greater than 30 minutes.  Signed: Ashtin Rosner M 12/01/2013, 4:54 PM

## 2013-12-12 NOTE — Discharge Summary (Signed)
Pt was admitted with: 1. Dehydration:  Pt presented with mild hydration. She was managed with IVF D5.45. Pt had some nausea and 1 episode of emesis. Following hydration she was able to tolerate oral intake without difficulty. 2. Acute Bronchitis: She had some mild wheezing and was treated with Albuterol and started on Azithromycin. Pt was initially given an oral dose of 500 mg but had an immediate episode of emesis. The dose was replaced by IV route. She will complete an additional 4 days of 250 mg. 3. Hb SS with crisis: Pt is relatively opiate naive. She was treated with rapid re-dosing of Dialudid based on weight. He pain decreased significantly and decreased even further after she received Toradol. Pt was discharged home on her usual oral analgesics.  Pt seen and examined and discussed with NP Julianne HandlerLaChina Hollis. Agree with discharge home.

## 2013-12-12 NOTE — Progress Notes (Signed)
   Subjective:    Patient ID: Joan Martin, female    DOB: 11/04/1985, 28 y.o.   MRN: [id number]  HPI: Pt presented to clinic today with c/o cough productive of scant greenish sputum and occasional wheezing occurring for last 2 days. She denies fevers and chills. She has had a decreased appetite but has been drinking fluids.   She also reports pain in back and legs consistent with pain of sickle cell crisis. Pt has taken her oral medications as prescribed, however they have not been effective. She rates pain as 8/10 and localized to the above areas and described as throbbing in nature. There is not radiation and she has no associated symptoms.     Review of Systems  Constitutional: Positive for activity change. Negative for fever, chills and diaphoresis.  HENT: Negative.  Negative for sinus pressure, sneezing and sore throat.   Eyes: Negative.   Respiratory: Positive for cough and wheezing.   Cardiovascular: Negative.   Gastrointestinal: Negative.   Endocrine: Negative.   Genitourinary: Negative.   Musculoskeletal: Positive for arthralgias and myalgias.  Skin: Negative.   Allergic/Immunologic: Negative.   Neurological: Negative.  Negative for dizziness.  Hematological: Negative.   Psychiatric/Behavioral: Negative.        Objective:   Physical Exam  Constitutional: She is oriented to person, place, and time. She appears well-developed and well-nourished. She appears distressed.  HENT:  Head: Normocephalic and atraumatic.  Eyes: Conjunctivae and EOM are normal. Pupils are equal, round, and reactive to light. No scleral icterus.  Neck: Normal range of motion. Neck supple. No JVD present. No thyromegaly present.  Cardiovascular: Normal rate and regular rhythm.  Exam reveals no gallop and no friction rub.   No murmur heard. Pulmonary/Chest: Effort normal and breath sounds normal. No respiratory distress. She has no wheezes. She has no rales.  Abdominal: Soft. Bowel sounds are normal.  She exhibits no distension and no mass. There is no tenderness.  Musculoskeletal: Normal range of motion.  Lymphadenopathy:    She has cervical adenopathy.  Neurological: She is alert and oriented to person, place, and time. No cranial nerve deficit.  Skin: Skin is warm and dry.  Psychiatric: She has a normal mood and affect. Her behavior is normal. Judgment and thought content normal.          Assessment & Plan:  1. Acute Bronchitis: Pt will be treated with Azithromycin for acute bronchitis. Presently she has no wheezing. If she develops wheezing will treat with bronchodilator.  2. Hb Ss with acute pain crisis: [facility] to [facility] for acute management of pain.

## 2013-12-20 ENCOUNTER — Encounter (HOSPITAL_COMMUNITY): Payer: Self-pay | Admitting: Emergency Medicine

## 2013-12-20 ENCOUNTER — Emergency Department (INDEPENDENT_AMBULATORY_CARE_PROVIDER_SITE_OTHER): Payer: Medicaid Other

## 2013-12-20 ENCOUNTER — Emergency Department (HOSPITAL_COMMUNITY)
Admission: EM | Admit: 2013-12-20 | Discharge: 2013-12-20 | Disposition: A | Discharge status: Home / Self Care | Payer: Medicaid Other | Source: Home / Self Care

## 2013-12-20 DIAGNOSIS — S92919A Unspecified fracture of unspecified toe(s), initial encounter for closed fracture: Secondary | ICD-10-CM

## 2013-12-20 DIAGNOSIS — S92912A Unspecified fracture of left toe(s), initial encounter for closed fracture: Secondary | ICD-10-CM

## 2013-12-20 MED ORDER — KETOROLAC TROMETHAMINE 60 MG/2ML IM SOLN
INTRAMUSCULAR | Status: AC
  Filled 2013-12-20: qty 2

## 2013-12-20 MED ORDER — KETOROLAC TROMETHAMINE 60 MG/2ML IM SOLN
60.0000 mg | Freq: Once | INTRAMUSCULAR | Status: AC
  Administered 2013-12-20: 60 mg via INTRAMUSCULAR

## 2013-12-20 NOTE — Discharge Instructions (Signed)
You fractured your 3rd adn 4th toes of the Left foot.  This will likely take 6 weeks to heal Please start your ibuprofen 600mg  every 6 hours tomorrow night Please keep your foot elevated as much as possible for the next 1-2 days Please seek immediate medical attention if you develop severe pain Please apply heat after 72 hours from your injury

## 2013-12-20 NOTE — ED Notes (Signed)
Patient complains of left foot pain; states she hit her left 3 rd toe on a metal table leg and states that her toe was turned to the side; states pain and swelling now.

## 2013-12-20 NOTE — ED Provider Notes (Signed)
CSN: [id number]     Arrival date & time 12/20/13  1921 History   None    Chief Complaint  Patient presents with  . Foot Injury   (Consider location/radiation/quality/duration/timing/severity/associated sxs/prior Treatment) HPI  Foot injury: L foot. Leaving house in a hurry and ran into table. Struck middle toe and initially looked angled to the right. Very painful. 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 Denies numbness or tingling. Able to move toe but very painful. Ibuprofen 400mg  w/o benefit.   Last [place] crisis in November.     Past Medical History  Diagnosis Date  . Sickle cell anemia   . Stroke     7 years ago  . Sickle cell anemia   . Asthma    Past Surgical History  Procedure Laterality Date  . Cholecystectomy     Family History  Problem Relation Age of Onset  . Diabetes Mother   . Hypertension Mother   . Diabetes Father   . Hypertension Father   . Cancer Maternal Aunt    History  Substance Use Topics  . Smoking status: Never Smoker   . Smokeless tobacco: Not on file  . Alcohol Use: No   OB History   Grav Para Term Preterm Abortions TAB SAB Ect Mult Living                 Review of Systems  Musculoskeletal: Positive for arthralgias.  All other systems reviewed and are negative.    Allergies  Cephalosporins; Citrus; and Morphine and related  Home Medications   Current Outpatient Rx  Name  Route  Sig  Dispense  Refill  . albuterol (PROVENTIL) (2.5 MG/3ML) 0.083% nebulizer solution   Nebulization   Take 3 mLs (2.5 mg total) by nebulization every 6 (six) hours as needed for shortness of breath.   75 mL   12   . cholecalciferol (VITAMIN D) 1000 UNITS tablet   Oral   Take 1,000 Units by mouth daily.         . diphenhydrAMINE (BENADRYL) 25 MG tablet   Oral   Take 25 mg by mouth every 6 (six) hours as needed for itching.         . Fluticasone-Salmeterol (ADVAIR) 250-50 MCG/DOSE AEPB   Inhalation   Inhale 1 puff into the lungs daily.         .  folic acid (FOLVITE) 1 MG tablet   Oral   Take 1 tablet (1 mg total) by mouth daily.   30 tablet   11   . hydroxyurea (HYDREA) 500 MG capsule      TAKE ONE CAPSULE BY MOUTH TWICE DAILY   60 capsule   0   . ibuprofen (ADVIL,MOTRIN) 600 MG tablet   Oral   Take 1 tablet (600 mg total) by mouth every 8 (eight) hours as needed for pain.   30 tablet   0   . mometasone (NASONEX) 50 MCG/ACT nasal spray   Nasal   Place 2 sprays into the nose daily.         . ondansetron (ZOFRAN) 4 MG tablet   Oral   Take 1 tablet (4 mg total) by mouth every 6 (six) hours.   12 tablet   0   . oxyCODONE-acetaminophen (PERCOCET) 10-325 MG per tablet   Oral   Take 1 tablet by mouth every 6 (six) hours as needed for pain.   60 tablet   0   . promethazine (PHENERGAN) 25 MG tablet  Oral   Take 25 mg by mouth every 6 (six) hours as needed for nausea.         Marland Kitchen. ZITHROMAX 250 MG tablet   Oral   Take 1 tablet (250 mg total) by mouth daily.   4 each   0     Dispense as written.    BP 128/97  Pulse 84  Temp(Src) 97.7 F (36.5 C) (Oral)  Resp 16  SpO2 93%  LMP 12/09/2013 Physical Exam  Constitutional: She is oriented to person, place, and time. She appears well-developed. No distress.  HENT:  Head: Normocephalic and atraumatic.  Eyes: EOM are normal. Pupils are equal, round, and reactive to light.  Neck: Normal range of motion. Neck supple.  Cardiovascular: Normal rate, regular rhythm, normal heart sounds and intact distal pulses.  Exam reveals no gallop and no friction rub.   No murmur heard. Pulmonary/Chest: Effort normal and breath sounds normal. No respiratory distress.  Abdominal: Soft. Bowel sounds are normal.  Musculoskeletal: Normal range of motion.  3rd and 4th toes of the L foot laterally deviated and  Swollen and very ttp. Sensation intact and <2sec cap refill  Neurological: She is alert and oriented to person, place, and time.  Skin: Skin is warm. No rash noted. She is not  diaphoretic.  Psychiatric: She has a normal mood and affect. Her behavior is normal. Judgment and thought content normal.    ED Course  Procedures (including critical care time) Labs Review Labs Reviewed - No data to display Imaging Review Dg Toe 3rd Left  12/20/2013   CLINICAL DATA:  Toe injury, pain  EXAM: LEFT THIRD TOE  COMPARISON:  None.  FINDINGS: There is an acute nondisplaced fracture of the left fourth toe proximal phalanx close to the MTP joint. Preserved joint spaces. No subluxation or dislocation. There is also cortical irregularity of the left third toe proximal phalanx distally close to the PIP joint suspicious for another toe fracture.  IMPRESSION: Left third and fourth toe proximal phalanx fractures.   Electronically Signed   By: Ruel Favorsrevor  Shick M.D.   On: 12/20/2013 21:42     MDM   1. Toe fracture, left    L 3rd adn 4th phalanx fractures w/ lateral deviation. No sickle cell crisis - Toradol 60 in office - No ice due to [place] - elevation, buddy tape, post-op shoe - precautions discussed and likely course of fracture healing discussed Precautions given and all questions answered  Shelly Flattenavid Merrell, MD Family Medicine PGY-3 12/20/2013, 10:13 PM      Ozella Rocksavid J Merrell, MD 12/20/13 2213  Ozella Rocksavid J Merrell, MD 12/20/13 2216

## 2013-12-20 NOTE — ED Provider Notes (Signed)
Medical screening examination/treatment/procedure(s) were performed by a resident physician and as supervising physician I was immediately available for consultation/collaboration.  Leslee Homeavid Rhemi Balbach, M.D.  Reuben Likesavid C Io Dieujuste, MD 12/20/13 [phone]

## 2013-12-24 ENCOUNTER — Other Ambulatory Visit: Payer: Self-pay | Admitting: Internal Medicine

## 2014-01-03 ENCOUNTER — Encounter: Payer: Self-pay | Admitting: Family Medicine

## 2014-01-03 ENCOUNTER — Ambulatory Visit (INDEPENDENT_AMBULATORY_CARE_PROVIDER_SITE_OTHER): Payer: Medicaid Other | Admitting: Family Medicine

## 2014-01-03 VITALS — BP 133/84 | HR 80 | Temp 98.2°F | Resp 16 | Wt 135.0 lb

## 2014-01-03 DIAGNOSIS — M79672 Pain in left foot: Secondary | ICD-10-CM | POA: Insufficient documentation

## 2014-01-03 DIAGNOSIS — D571 Sickle-cell disease without crisis: Secondary | ICD-10-CM

## 2014-01-03 DIAGNOSIS — M79609 Pain in unspecified limb: Secondary | ICD-10-CM

## 2014-01-03 MED ORDER — FOLIC ACID 1 MG PO TABS: 1.0000 mg | ORAL_TABLET | Freq: Every day | ORAL | Status: AC

## 2014-01-03 MED ORDER — OXYCODONE-ACETAMINOPHEN 10-325 MG PO TABS: 1.0000 | ORAL_TABLET | Freq: Four times a day (QID) | ORAL | Status: DC | PRN

## 2014-01-03 NOTE — Progress Notes (Signed)
   Subjective:    Patient ID: Joan Martin, female    DOB: 10/31/1985, 28 y.o.   MRN: [id number]  HPI  Patient presents for follow up on fractures to 3rd and 4th toes of left foot that occurred on 12/20/2013. Patient sustained fractures after running into a table in her home. Patient immediately followed up at urgent care. Current pain intensity is 3/10, localized to left 3rd and 4th toes. Pain is described as non-radiating, sore, and aching in nature. Reports that foot is significantly swollen at the end of her day due to the hardness of the shoe. Patient reports that pain is relieved by Ibuprofen 600 last taken on 01/02/2014 around 9 pm.   Review of Systems  Constitutional: Negative.   HENT: Negative.   Eyes: Negative.   Respiratory: Negative.   Cardiovascular: Negative.   Gastrointestinal: Negative.   Endocrine: Negative.   Genitourinary: Negative.   Musculoskeletal: Positive for joint swelling. Negative for myalgias (left 3rd and 4th toe fracture 2 weeks ago).  Skin: Negative.   Allergic/Immunologic: Negative.   Neurological: Negative.   Hematological: Negative.   Psychiatric/Behavioral: Negative.        Objective:   Physical Exam  Constitutional: She is oriented to person, place, and time. She appears well-developed and well-nourished.  HENT:  Head: Normocephalic and atraumatic.  Eyes: Conjunctivae are normal. Pupils are equal, round, and reactive to light.  Neck: Normal range of motion. Neck supple.  Cardiovascular: Normal rate, regular rhythm and normal heart sounds.   Pulmonary/Chest: Effort normal.  Abdominal: Soft. Bowel sounds are normal.  Musculoskeletal:       Feet:  Neurological: She is alert and oriented to person, place, and time.  Skin: Skin is warm and dry.  Psychiatric: She has a normal mood and affect. Her behavior is normal.          Assessment & Plan:   1. 3rd and 4th phalanx fractures: Continue Ibuprofen 600 three times daily as needed for mild to  moderate foot pain. Recommend compression and elevation as discussed. Elevate extremity to heart level while at rest. Apply warm moist compresses as needed. Continue to buddy tape and wear post-op shoe.    2. Sickle cell anemia without crisis: Sickle cell disease controlled on current pain regimen. Given prescription for Percocet 10-325 mg every 6 hours as needed for moderate to severe pain #60.    RTC: 3 weeks to follow up on left 3rd and 4th phalanx fractures Hematology: refer to hematologist  [place] exam[place], will refer to eye exam

## 2014-01-03 NOTE — Patient Instructions (Signed)
Ibuprofen 600 mg three times daily as needed for mild to moderate pain.  Elevate left lower extremity to heart level while at rest.  Apply warm, moist compresses to left lower extremity as needed  Continue with current medication regimen.

## 2014-01-17 ENCOUNTER — Telehealth: Payer: Self-pay | Admitting: Hematology and Oncology

## 2014-01-17 NOTE — Telephone Encounter (Signed)
LEFT MESSAGE FOR PATIENT TO RETURN CALL TO SCHEDULE NEW PATIENT APPT.  °

## 2014-01-18 ENCOUNTER — Telehealth: Payer: Self-pay | Admitting: Hematology and Oncology

## 2014-01-18 NOTE — Telephone Encounter (Signed)
C/D 01/18/14 for appt. 02/20/14

## 2014-01-18 NOTE — Telephone Encounter (Signed)
S/W PATIENT AND GAVE NEW PATIENT APPT FOR 05/04 @ 8 W/DR. GORSUCH.  REFERRING - [facility] DX- S[facility]LE [facility] WELCOME PACKET MAILED.

## 2014-01-27 ENCOUNTER — Ambulatory Visit (INDEPENDENT_AMBULATORY_CARE_PROVIDER_SITE_OTHER): Payer: Medicaid Other | Admitting: Family Medicine

## 2014-01-27 ENCOUNTER — Encounter: Payer: Self-pay | Admitting: Family Medicine

## 2014-01-27 VITALS — BP 123/82 | HR 87 | Temp 98.3°F | Resp 20 | Ht 67.0 in | Wt 129.0 lb

## 2014-01-27 DIAGNOSIS — D571 Sickle-cell disease without crisis: Secondary | ICD-10-CM

## 2014-01-27 DIAGNOSIS — M79672 Pain in left foot: Secondary | ICD-10-CM

## 2014-01-27 DIAGNOSIS — M79609 Pain in unspecified limb: Secondary | ICD-10-CM

## 2014-01-27 NOTE — Progress Notes (Signed)
   Subjective:    Patient ID: Joan Martin, female    DOB: 07/31/1986, 28 y.o.   MRN: [id number]  HPI 28 year old female with a history of sickle cell anemia presents for 3 week  follow up on fractures to 3rd and 4th metatarsals of left foot that occurred on 12/20/2013. Patient sustained fractures after running into a table in her home. Patient immediately followed up at urgent care. Current pain intensity is 1-2/10, localized to left 3rd and 4th toes. Pain is described as non-radiating, sore in nature. She reports that pain occurs mainly after wearing the orthopaedic shoe for an extended period of time. Patient denies swelling to the fractured toes. Occasionally takes Ibuprofen 600 mg 3 times daily as needed for left foot discomfort.   Review of Systems  HENT: Negative.   Eyes: Negative.   Respiratory: Negative.   Cardiovascular: Negative.   Gastrointestinal: Negative.  Negative for constipation.  Endocrine: Negative.   Genitourinary: Negative.  Negative for difficulty urinating.  Musculoskeletal: Positive for myalgias (left 3rd and 4th metatarsals). Negative for joint swelling.  Skin: Negative.   Allergic/Immunologic: Positive for environmental allergies.  Neurological: Negative.   Hematological: Negative.   Psychiatric/Behavioral: Negative.        Objective:   Physical Exam  Nursing note and vitals reviewed. Constitutional: She is oriented to person, place, and time. She appears well-developed and well-nourished.  HENT:  Head: Normocephalic and atraumatic.  Eyes: Pupils are equal, round, and reactive to light.  Neck: Normal range of motion. Neck supple.  Cardiovascular: Normal rate, regular rhythm and normal heart sounds.   Pulmonary/Chest: Effort normal.  Abdominal: Soft. Bowel sounds are normal.  Musculoskeletal: Normal range of motion. She exhibits no edema and no tenderness.  Neurological: She is alert and oriented to person, place, and time. She has normal reflexes. No  cranial nerve deficit. Coordination normal.  Skin: Skin is warm and dry.  Psychiatric: She has a normal mood and affect.          Assessment & Plan:  1. Left foot fracture: Patient has been wearing orthopedic shoe and buddy taping 3-5th metatarsals as recommended. Reports that ambulation has improved. Recommend that patient transition to a tennis shoe or sneaker. Continue buddy taping for 2 weeks.   2. Sickle cell disease: Controlled on current medication regimen. She is maintaining hydration, taking Folic acid, and hydroxyurea daily. Patient reports that she rarely has to take pain medications.   RTC: Patient to follow up with Dr. Ashley RoyaltyMatthews for complete physical examination and labs  in 3 months

## 2014-02-15 ENCOUNTER — Telehealth: Payer: Self-pay

## 2014-02-16 NOTE — Telephone Encounter (Signed)
Please notify patient to schedule an appointment to discuss it further.

## 2014-02-20 ENCOUNTER — Ambulatory Visit: Payer: Medicaid Other | Admitting: Hematology and Oncology

## 2014-02-20 ENCOUNTER — Ambulatory Visit: Payer: Medicaid Other

## 2014-02-23 ENCOUNTER — Other Ambulatory Visit: Payer: Self-pay | Admitting: Internal Medicine

## 2014-02-23 ENCOUNTER — Ambulatory Visit (INDEPENDENT_AMBULATORY_CARE_PROVIDER_SITE_OTHER): Payer: Medicaid Other | Admitting: Family Medicine

## 2014-02-23 ENCOUNTER — Other Ambulatory Visit: Payer: Self-pay | Admitting: Family Medicine

## 2014-02-23 ENCOUNTER — Encounter: Payer: Self-pay | Admitting: Family Medicine

## 2014-02-23 VITALS — BP 131/87 | HR 87 | Temp 97.0°F | Resp 20 | Ht 67.0 in | Wt 134.0 lb

## 2014-02-23 DIAGNOSIS — N926 Irregular menstruation, unspecified: Secondary | ICD-10-CM

## 2014-02-23 DIAGNOSIS — N939 Abnormal uterine and vaginal bleeding, unspecified: Secondary | ICD-10-CM

## 2014-02-23 DIAGNOSIS — D571 Sickle-cell disease without crisis: Secondary | ICD-10-CM

## 2014-02-23 LAB — COMPLETE METABOLIC PANEL WITH GFR
ALT: 56 U/L — AB (ref 0–35)
AST: 133 U/L — AB (ref 0–37)
Albumin: 3.5 g/dL (ref 3.5–5.2)
Alkaline Phosphatase: 345 U/L — ABNORMAL HIGH (ref 39–117)
BUN: 8 mg/dL (ref 6–23)
CALCIUM: 8.5 mg/dL (ref 8.4–10.5)
CHLORIDE: 105 meq/L (ref 96–112)
CO2: 19 mEq/L (ref 19–32)
Creat: 0.65 mg/dL (ref 0.50–1.10)
GFR, Est African American: >89 mL/min
GFR, Est Non African American: >89 mL/min
Glucose, Bld: 98 mg/dL (ref 70–99)
Potassium: 4.5 mEq/L (ref 3.5–5.3)
Sodium: 133 mEq/L — ABNORMAL LOW (ref 135–145)
Total Bilirubin: 9 mg/dL — ABNORMAL HIGH (ref 0.2–1.2)
Total Protein: 7.7 g/dL (ref 6.0–8.3)

## 2014-02-23 NOTE — Progress Notes (Signed)
   Subjective:    Patient ID: Joan Martin, female    DOB: 10/10/1986, 28 y.o.   MRN: [id number]  HPI Patient with a history of sickle cell anemia HbSS presents for an abnormal menstrual cycle. Patient states that she had 2 menstrual cycles in the month of April, which is abnormal for her. She reports that cycle is typically regular, comes every 28-30 days for 5-6 days. Reports that 2nd cycle was heavy and she felt dizzy on day 2. Dizziness disicipated as cycle went on. Patient maintains that she is not on birth control to regulate cycles due to a history of stroke.   Review of Systems  Constitutional: Negative for fatigue.  HENT: Negative.   Eyes: Negative.   Respiratory: Negative.   Cardiovascular: Negative.   Gastrointestinal: Negative.   Genitourinary: Negative.   Musculoskeletal: Negative.   Skin: Negative.   Allergic/Immunologic: Negative.   Neurological: Negative.   Hematological: Negative.   Psychiatric/Behavioral: Negative.        Objective:   Physical Exam  Constitutional: She is oriented to person, place, and time. She appears well-developed and well-nourished.  HENT:  Head: Normocephalic and atraumatic.  Eyes: Pupils are equal, round, and reactive to light.  Neck: Normal range of motion.  Cardiovascular: Normal rate and regular rhythm.   Pulmonary/Chest: Effort normal and breath sounds normal.  Abdominal: Soft. Bowel sounds are normal.  Musculoskeletal: Normal range of motion.  Neurological: She is alert and oriented to person, place, and time.  Skin: Skin is warm and dry.  Psychiatric: She has a normal mood and affect. Her behavior is normal. Judgment and thought content normal.          Assessment & Plan:  1. Abnormal menses: Recommend that patient maintains a menstruation each month and note any changes that may occur . Patient reports that she experienced dizziness during abnormal menstrual cycle. Will check CBC with differential and CMP due to hx of  sickle cell anemia. Will also check TSH.   2. Sickle cell anemia: Patient states that she does not have pain. Reports that she is consistently taking Hydroxyurea and Folic acid. She is also drinking 64 ounces of water or more per day. States that she has not required pain medication since menstrual cycle.   RTC: Patient is schedule in 1 month for visit with Dr. Ashley RoyaltyMatthews for CPE w/pap and pelvic exam Labs: CMP, CBCw/d, TSH

## 2014-02-24 LAB — TSH: TSH: 0.762 u[IU]/mL (ref 0.350–4.500)

## 2014-03-01 ENCOUNTER — Telehealth: Payer: Self-pay

## 2014-03-01 NOTE — Telephone Encounter (Signed)
Pt returned call to office stating [facility] noon on [facility].

## 2014-03-02 ENCOUNTER — Other Ambulatory Visit: Payer: Self-pay | Admitting: Family Medicine

## 2014-03-02 ENCOUNTER — Other Ambulatory Visit: Payer: Medicaid Other

## 2014-03-02 ENCOUNTER — Encounter: Payer: Self-pay | Admitting: Family Medicine

## 2014-03-02 DIAGNOSIS — D571 Sickle-cell disease without crisis: Secondary | ICD-10-CM

## 2014-03-03 ENCOUNTER — Encounter (HOSPITAL_COMMUNITY): Payer: Self-pay

## 2014-03-03 ENCOUNTER — Telehealth (HOSPITAL_COMMUNITY): Payer: Self-pay | Admitting: Internal Medicine

## 2014-03-03 ENCOUNTER — Non-Acute Institutional Stay (HOSPITAL_COMMUNITY)
Admission: AD | Admit: 2014-03-03 | Discharge: 2014-03-03 | Disposition: A | Discharge status: Home / Self Care | Payer: Medicaid Other | Source: Ambulatory Visit | Attending: Family Medicine | Admitting: Family Medicine

## 2014-03-03 DIAGNOSIS — E860 Dehydration: Secondary | ICD-10-CM | POA: Diagnosis present

## 2014-03-03 DIAGNOSIS — Z79899 Other long term (current) drug therapy: Secondary | ICD-10-CM | POA: Insufficient documentation

## 2014-03-03 DIAGNOSIS — Z8673 Personal history of transient ischemic attack (TIA), and cerebral infarction without residual deficits: Secondary | ICD-10-CM | POA: Insufficient documentation

## 2014-03-03 DIAGNOSIS — L299 Pruritus, unspecified: Secondary | ICD-10-CM | POA: Diagnosis present

## 2014-03-03 DIAGNOSIS — J45909 Unspecified asthma, uncomplicated: Secondary | ICD-10-CM | POA: Insufficient documentation

## 2014-03-03 DIAGNOSIS — D571 Sickle-cell disease without crisis: Secondary | ICD-10-CM

## 2014-03-03 DIAGNOSIS — R17 Unspecified jaundice: Secondary | ICD-10-CM

## 2014-03-03 DIAGNOSIS — M545 Low back pain, unspecified: Secondary | ICD-10-CM | POA: Insufficient documentation

## 2014-03-03 DIAGNOSIS — D5700 Hb-SS disease with crisis, unspecified: Secondary | ICD-10-CM | POA: Diagnosis present

## 2014-03-03 DIAGNOSIS — M79672 Pain in left foot: Secondary | ICD-10-CM | POA: Diagnosis present

## 2014-03-03 DIAGNOSIS — R110 Nausea: Secondary | ICD-10-CM | POA: Diagnosis present

## 2014-03-03 DIAGNOSIS — R002 Palpitations: Secondary | ICD-10-CM | POA: Insufficient documentation

## 2014-03-03 DIAGNOSIS — K117 Disturbances of salivary secretion: Secondary | ICD-10-CM | POA: Insufficient documentation

## 2014-03-03 DIAGNOSIS — D599 Acquired hemolytic anemia, unspecified: Secondary | ICD-10-CM | POA: Diagnosis present

## 2014-03-03 LAB — COMPREHENSIVE METABOLIC PANEL
ALT: 59 U/L — ABNORMAL HIGH (ref 0–35)
AST: 130 U/L — AB (ref 0–37)
Albumin: 3.1 g/dL — ABNORMAL LOW (ref 3.5–5.2)
Alkaline Phosphatase: 378 U/L — ABNORMAL HIGH (ref 39–117)
BILIRUBIN TOTAL: 12.4 mg/dL — AB (ref 0.3–1.2)
BUN: 15 mg/dL (ref 6–23)
CALCIUM: 9.1 mg/dL (ref 8.4–10.5)
CHLORIDE: 105 meq/L (ref 96–112)
CO2: 18 meq/L — AB (ref 19–32)
CREATININE: 0.88 mg/dL (ref 0.50–1.10)
GFR, EST NON AFRICAN AMERICAN: 88 mL/min — AB (ref >90)
GLUCOSE: 101 mg/dL — AB (ref 70–99)
Potassium: 4 mEq/L (ref 3.7–5.3)
Sodium: 138 mEq/L (ref 137–147)
Total Protein: 8 g/dL (ref 6.0–8.3)

## 2014-03-03 LAB — CBC WITH DIFFERENTIAL/PLATELET
BASOS ABS: 0.4 10*3/uL — AB (ref 0.0–0.1)
Basophils Absolute: 0.3 10*3/uL — ABNORMAL HIGH (ref 0.0–0.1)
Basophils Relative: 4 % — ABNORMAL HIGH (ref 0–1)
Basophils Relative: 4 % — ABNORMAL HIGH (ref 0–1)
EOS PCT: 4 % (ref 0–5)
EOS PCT: 4 % (ref 0–5)
Eosinophils Absolute: 0.4 10*3/uL (ref 0.0–0.7)
Eosinophils Absolute: 0.3 10*3/uL (ref 0.0–0.7)
HCT: 23.5 % — ABNORMAL LOW (ref 36.0–46.0)
HEMATOCRIT: 25.2 % — AB (ref 36.0–46.0)
Hemoglobin: 9.2 g/dL — ABNORMAL LOW (ref 12.0–15.0)
Hemoglobin: 8.7 g/dL — ABNORMAL LOW (ref 12.0–15.0)
LYMPHS ABS: 3 10*3/uL (ref 0.7–4.0)
LYMPHS PCT: 32 % (ref 12–46)
LYMPHS PCT: 29 % (ref 12–46)
Lymphs Abs: 2.4 10*3/uL (ref 0.7–4.0)
MCH: 39.1 pg — ABNORMAL HIGH (ref 26.0–34.0)
MCH: 40.1 pg — ABNORMAL HIGH (ref 26.0–34.0)
MCHC: 36.5 g/dL — ABNORMAL HIGH (ref 30.0–36.0)
MCHC: 37 g/dL — ABNORMAL HIGH (ref 30.0–36.0)
MCV: 107.2 fL — AB (ref 78.0–100.0)
MCV: 108.3 fL — ABNORMAL HIGH (ref 78.0–100.0)
MONO ABS: 1 10*3/uL (ref 0.1–1.0)
MONOS PCT: 15 % — AB (ref 3–12)
Monocytes Absolute: 1.4 10*3/uL — ABNORMAL HIGH (ref 0.1–1.0)
Monocytes Relative: 12 % (ref 3–12)
NEUTROS ABS: 4.3 10*3/uL (ref 1.7–7.7)
NEUTROS PCT: 51 % (ref 43–77)
Neutro Abs: 4.3 10*3/uL (ref 1.7–7.7)
Neutrophils Relative %: 45 % (ref 43–77)
PLATELETS: 176 10*3/uL (ref 150–400)
Platelets: 209 10*3/uL (ref 150–400)
RBC: 2.35 MIL/uL — AB (ref 3.87–5.11)
RBC: 2.17 MIL/uL — AB (ref 3.87–5.11)
RDW: 19.2 % — AB (ref 11.5–15.5)
RDW: 19.4 % — ABNORMAL HIGH (ref 11.5–15.5)
WBC: 9.5 10*3/uL (ref 4.0–10.5)
WBC: 8.3 10*3/uL (ref 4.0–10.5)

## 2014-03-03 LAB — RETICULOCYTES
RBC.: 2.17 MIL/uL — AB (ref 3.87–5.11)
RETIC CT PCT: 18.7 % — AB (ref 0.4–3.1)
Retic Count, Absolute: 405.8 10*3/uL — ABNORMAL HIGH (ref 19.0–186.0)

## 2014-03-03 MED ORDER — OXYCODONE-ACETAMINOPHEN 5-325 MG PO TABS
1.0000 | ORAL_TABLET | Freq: Once | ORAL | Status: AC
  Administered 2014-03-03: 1 via ORAL
  Filled 2014-03-03: qty 1

## 2014-03-03 MED ORDER — DIPHENHYDRAMINE HCL 50 MG/ML IJ SOLN
12.5000 mg | INTRAMUSCULAR | Status: AC
  Administered 2014-03-03: 12.5 mg via INTRAVENOUS
  Filled 2014-03-03: qty 1

## 2014-03-03 MED ORDER — OXYCODONE HCL 5 MG PO TABS
5.0000 mg | ORAL_TABLET | Freq: Once | ORAL | Status: AC
  Administered 2014-03-03: 5 mg via ORAL
  Filled 2014-03-03: qty 1

## 2014-03-03 MED ORDER — FOLIC ACID 1 MG PO TABS
1.0000 mg | ORAL_TABLET | Freq: Every day | ORAL | Status: DC
  Administered 2014-03-03: 1 mg via ORAL
  Filled 2014-03-03: qty 1

## 2014-03-03 MED ORDER — HYDROMORPHONE HCL PF 2 MG/ML IJ SOLN
0.5000 mg | INTRAMUSCULAR | Status: DC | PRN
  Administered 2014-03-03 (×2): 0.5 mg via INTRAVENOUS
  Filled 2014-03-03 (×2): qty 1

## 2014-03-03 MED ORDER — NALOXONE HCL 0.4 MG/ML IJ SOLN: 0.4000 mg | INTRAMUSCULAR | Status: DC | PRN

## 2014-03-03 MED ORDER — DIPHENHYDRAMINE HCL 12.5 MG/5ML PO ELIX: 12.5000 mg | ORAL_SOLUTION | Freq: Four times a day (QID) | ORAL | Status: DC | PRN

## 2014-03-03 MED ORDER — HYDROMORPHONE HCL PF 2 MG/ML IJ SOLN
1.0000 mg | Freq: Once | INTRAMUSCULAR | Status: AC
  Administered 2014-03-03: 1 mg via INTRAVENOUS
  Filled 2014-03-03: qty 1

## 2014-03-03 MED ORDER — HYDROMORPHONE 0.3 MG/ML IV SOLN: INTRAVENOUS | Status: DC

## 2014-03-03 MED ORDER — DIPHENHYDRAMINE HCL 50 MG/ML IJ SOLN: 12.5000 mg | Freq: Four times a day (QID) | INTRAMUSCULAR | Status: DC | PRN

## 2014-03-03 MED ORDER — ONDANSETRON HCL 4 MG/2ML IJ SOLN
4.0000 mg | INTRAMUSCULAR | Status: DC | PRN
  Administered 2014-03-03: 4 mg via INTRAVENOUS
  Filled 2014-03-03: qty 2

## 2014-03-03 MED ORDER — FOLIC ACID 1 MG PO TABS: 1.0000 mg | ORAL_TABLET | Freq: Every day | ORAL | Status: DC

## 2014-03-03 MED ORDER — DEXTROSE-NACL 5-0.45 % IV SOLN
INTRAVENOUS | Status: DC
  Administered 2014-03-03: 10:00:00 via INTRAVENOUS

## 2014-03-03 MED ORDER — DIPHENHYDRAMINE HCL 25 MG PO TABS
25.0000 mg | ORAL_TABLET | Freq: Four times a day (QID) | ORAL | Status: DC | PRN
  Administered 2014-03-03: 25 mg via ORAL
  Filled 2014-03-03 (×2): qty 1

## 2014-03-03 MED ORDER — KETOROLAC TROMETHAMINE 15 MG/ML IJ SOLN
15.0000 mg | Freq: Four times a day (QID) | INTRAMUSCULAR | Status: DC
  Administered 2014-03-03: 15 mg via INTRAVENOUS
  Filled 2014-03-03 (×4): qty 1

## 2014-03-03 MED ORDER — ONDANSETRON HCL 4 MG PO TABS: 4.0000 mg | ORAL_TABLET | ORAL | Status: DC | PRN

## 2014-03-03 MED ORDER — OXYCODONE-ACETAMINOPHEN 10-325 MG PO TABS: ORAL_TABLET | ORAL | Status: DC

## 2014-03-03 MED ORDER — SODIUM CHLORIDE 0.9 % IJ SOLN: 9.0000 mL | INTRAMUSCULAR | Status: DC | PRN

## 2014-03-03 MED ORDER — HYDROXYUREA 500 MG PO CAPS
500.0000 mg | ORAL_CAPSULE | Freq: Two times a day (BID) | ORAL | Status: DC
  Administered 2014-03-03: 500 mg via ORAL
  Filled 2014-03-03 (×2): qty 1

## 2014-03-03 MED ORDER — ONDANSETRON HCL 4 MG/2ML IJ SOLN: 4.0000 mg | Freq: Four times a day (QID) | INTRAMUSCULAR | Status: DC | PRN

## 2014-03-03 NOTE — Telephone Encounter (Signed)
Pt called to request to come to day hospital for treatment; states has been experiencing back pain for 3 days with no relief from pain meds; notified pt that I would contact the physician and call her back

## 2014-03-03 NOTE — H&P (Signed)
[facility] History and Physical  Rogers Seedsatrice Ricke [id number] DOB: 06/01/1986 DOA: 03/03/2014   PCP: MATTHEWS,MICHELLE A., MD   Chief Complaint: Persistent low back pain   HPI: This patient is an active 28 year old female with HbSS disease who presented to the [facility] with complaints of 3 days of progressively worsening low back pain.  She says that the pain initially started in the evening 3 days ago and was treated with ibuprofen taken before bed. She woke up with next morning with slightly worse lower back pain described as a 6/10.  She describes the pain as being in the center of lower back that radiates out.  It is in the bone and mostly in the muscles of the back from her perception.  She had to take a percocet which did help the pain a little.  The pain never fully resolved.  The patient continued to work but the low back pain has persisted and has continued to get worse.  She reports that she ended up taking 3 percocet tablets over the course of the last 24 hours.  She normally does not have to take this medication.  She has become more jaundiced over the last 2-3 days.  She has had nausea but no emesis.  She has also noticed dry mouth and occasional palpitations.  She reports that changes in weather frequently exacerbate her sickle cell symptoms but they are usually manageable at home with her oral meds.  Unfortunately with this current exacerbation she has not been able to manage her pain well at home.  She decided to come for extended observation in the sickle cell center.  She denies chest pain and shortness of breath.  She denies fever and chills.      Review of Systems: all other systems reviewed and reported negative Constitutional: No weight loss, night sweats, Fevers, chills, fatigue.  HEENT: No headaches, dizziness, seizures, vision changes, difficulty swallowing,Tooth/dental problems,Sore throat, No sneezing, itching, ear ache, nasal congestion, post nasal  drip,  Cardio-vascular: No chest pain, Orthopnea, PND, swelling in lower extremities, anasarca, dizziness, palpitations  GI: Positive for nausea, no heartburn, indigestion, abdominal pain, vomiting, diarrhea, change in bowel habits, loss of appetite  Resp: No shortness of breath with exertion or at rest. No excess mucus, no productive cough, No non-productive cough, No coughing up of blood.No change in color of mucus.No wheezing.No chest wall deformity  Skin: yellowing of skin and eyes above baseline noted.   GU: no dysuria, change in color of urine, no urgency or frequency. No flank pain.  Increased menses.  Musculoskeletal: Positive for low back pain.  No joint pain or swelling. No decreased range of motion.   Psych: No change in mood or affect. No depression or anxiety. No memory loss.   Past Medical History  Diagnosis Date  . Sickle cell anemia   . Stroke     7 years ago  . Sickle cell anemia   . Asthma    Past Surgical History  Procedure Laterality Date  . Cholecystectomy     Social History:  reports that she has never smoked. She does not have any smokeless tobacco history on file. She reports that she does not drink alcohol or use illicit drugs.  Allergies  Allergen Reactions  . Cephalosporins     Possible family reaction  . Citrus Other (See Comments)    Severity varies/ Reaction varies hives around mouth to swelling around mouth and lips.   . Morphine  And Related Hives    Patient states she can take IV dilaudid with benadryl    Family History  Problem Relation Age of Onset  . Diabetes Mother   . Hypertension Mother   . Diabetes Father   . Hypertension Father   . Cancer Maternal Aunt     Prior to Admission medications   Medication Sig Start Date End Date Taking? Authorizing Provider  albuterol (PROVENTIL) (2.5 MG/3ML) 0.083% nebulizer solution Take 3 mLs (2.5 mg total) by nebulization every 6 (six) hours as needed for shortness of breath. 12/01/13  Yes Massie MaroonLachina M  Hollis, FNP  cholecalciferol (VITAMIN D) 1000 UNITS tablet Take 1,000 Units by mouth daily.   Yes Historical Provider, MD  diphenhydrAMINE (BENADRYL) 25 MG tablet Take 25 mg by mouth every 6 (six) hours as needed for itching.   Yes Historical Provider, MD  Fluticasone-Salmeterol (ADVAIR) 250-50 MCG/DOSE AEPB Inhale 1 puff into the lungs daily.   Yes Historical Provider, MD  folic acid (FOLVITE) 1 MG tablet Take 1 tablet (1 mg total) by mouth daily. 01/03/14  Yes Massie MaroonLachina M Hollis, FNP  hydroxyurea (HYDREA) 500 MG capsule TAKE ONE CAPSULE BY MOUTH TWICE DAILY 02/23/14  Yes Altha HarmMichelle A Matthews, MD  ibuprofen (ADVIL,MOTRIN) 600 MG tablet Take 1 tablet (600 mg total) by mouth every 8 (eight) hours as needed for pain. 07/27/13  Yes Grayce SessionsMichelle P Edwards, NP  mometasone (NASONEX) 50 MCG/ACT nasal spray Place 2 sprays into the nose daily.   Yes Historical Provider, MD  ondansetron (ZOFRAN) 4 MG tablet Take 1 tablet (4 mg total) by mouth every 6 (six) hours. 07/23/13  Yes Jamesetta Orleanshristopher W Lawyer, PA-C  oxyCODONE-acetaminophen (PERCOCET) 10-325 MG per tablet Take 1 tablet by mouth every 6 (six) hours as needed for pain. 01/03/14  Yes Massie MaroonLachina M Hollis, FNP  promethazine (PHENERGAN) 25 MG tablet Take 25 mg by mouth every 6 (six) hours as needed for nausea.   Yes Historical Provider, MD   Physical Exam: Vitals reviewed in EMR  Head: Normocephalic and atraumatic.  Eyes: Pupils are equal, round, and reactive to light. Pronounced scleral icterus bilateral.  Neck: Normal range of motion.  Cardiovascular: Normal rate and regular rhythm.  Pulmonary/Chest: Effort normal and breath sounds normal.  Abdominal: Soft. Bowel sounds are normal.  Musculoskeletal: Normal range of motion. Tenderness to palpation lumbar spine and flank Neurological: She is alert and oriented to person, place, and time.  Skin: Skin is warm and dry. Jaundice noted.  Psychiatric: She has a normal mood and affect. Her behavior is normal. Judgment and  thought content normal.   Labs on Admission:   Basic Metabolic Panel: No results found for this basename: NA, K, CL, CO2, GLUCOSE, BUN, CREATININE, CALCIUM, MG, PHOS,  in the last 168 hours Liver Function Tests: No results found for this basename: AST, ALT, ALKPHOS, BILITOT, PROT, ALBUMIN,  in the last 168 hours CBC:  Recent Labs Lab 03/02/14 1215  WBC 9.5  NEUTROABS 4.3  HGB 9.2*  HCT 25.2*  MCV 107.2*  PLT 209   BNP: No components found with this basename: POCBNP,   Radiological Exams on Admission: No results found.   Assessment/Plan:  Sickle cell crisis  - The patient will be admitted into the extended observation center for evaluation and treatment.  Check CBC and CMP.  Start hypotonic IVFs with D5 1/2 NS.  Will initiate low dose dilaudid 0.5 mg IV for pain management and follow (Pt reports that she has had effective relief from low  dose dilaudid in the past).  Will provide medications for nausea and for itching.  In addition, will provide adjunctive therapies including a heating pad for the lower back and will give IV Toradol for treating inflammation.  Will reassess and make further adjustments pending clinical course.  Follow labs.    Dehydration - IV hydration as above    Jaundice, hemolytic / Hyperbilirubinemia / Pruritus - benadryl ordered as needed    Nausea alone - zofran ordered as needed.     Time spend: 45 mins Code Status: FULL  Family Communication: counseled at bedside Disposition Plan: pending clinical course  Cleora Fleet, MD  Pager [phone]  If 7PM-7AM, please contact night-coverage [email or web address] Password [facility] 03/03/2014, 9:57 AM

## 2014-03-03 NOTE — Progress Notes (Signed)
Patient ID: Joan Martin, female   DOB: 03/23/1986, 28 y.o.   MRN: [id number] Pt being discharged home accompanied by sister. Pt a&ox3, ambulatory, and pt in no distress at time of distress. PIV discontinued, dry gauze applied. Discharge instructions, prescription, and copy given to pt; pt verbalized understanding. Pt transported to front lobby via wheelchair.

## 2014-03-03 NOTE — Telephone Encounter (Signed)
Called pt to notify her that, per MD, she may come to the day hospital for evaluation; pt verbalizes understanding

## 2014-03-03 NOTE — Discharge Summary (Signed)
Physician Discharge Summary  Rogers Seedsatrice Winkleman [id number] DOB: 05/21/1986 DOA: 03/03/2014  PCP: MATTHEWS,MICHELLE A., MD  Admit date: 03/03/2014 Discharge date: 03/03/2014  Discharge Diagnoses:  Active Problems:   Dehydration   Sickle cell crisis   Hyperbilirubinemia   Nausea alone   Pruritus   Jaundice, hemolytic   Discharge Condition: Stable Disposition:   Diet: Regular Wt Readings from Last 3 Encounters:  02/23/14 134 lb (60.782 kg)  01/27/14 129 lb (58.514 kg)  01/03/14 135 lb (61.236 kg)    History of present illness:  This patient is an active 28 year old female with HbSS disease who presented to the [facility] center with complaints of 3 days of progressively worsening low back pain. She says that the pain initially started in the evening 3 days ago and was treated with ibuprofen taken before bed. She woke up with next morning with slightly worse lower back pain described as a 6/10. She describes the pain as being in the center of lower back that radiates out. It is in the bone and mostly in the muscles of the back from her perception. She had to take a percocet which did help the pain a little. The pain never fully resolved. The patient continued to work but the low back pain has persisted and has continued to get worse. She reports that she ended up taking 3 percocet tablets over the course of the last 24 hours. She normally does not have to take this medication. She has become more jaundiced over the last 2-3 days. She has had nausea but no emesis. She has also noticed dry mouth and occasional palpitations. She reports that changes in weather frequently exacerbate her sickle cell symptoms but they are usually manageable at home with her oral meds. Unfortunately with this current exacerbation she has not been able to manage her pain well at home. She decided to come for extended observation in the sickle cell center. She denies chest pain and shortness of breath. She denies fever  and chills.    Hospital Course:  Sickle cell anemia: Patient was admitted to the day hospital for extended observation. She was started on hypotonic IVFs for cellular rehydration. Patient rarely takes opiate medication to manage sickle cell pain, so she was started on Dilaudid 0.5 mg every 3 hours and IV Toradol. Pain intensity increased to 7/10 and patient was given Dilaudid 1 mg times one dose. Patient's pain intensity decrease and she was not transitioned to a full dose PCA. Patient was given Percocet 10-325 mg times 1 dose. Pain intensity decreased prior to discharge. Patient stated that pain intensity was 2/10. Given Rx for Percocet 10-325 mg every 4 hours as needed for severe pain #60. Recommended that patient continue to increase hydration.  Patient to follow up in office as scheduled.  Nausea: Resolved with IV Zofran times 1 dose Pruritis:  Resolved with Benadryl 12.5 mg IV times 1 doses  Discharge Exam:  Filed Vitals:   03/03/14 1539  BP: 119/91  Pulse: 87  Resp: 18   Filed Vitals:   03/03/14 1539  BP: 119/91  Pulse: 87  Resp: 18  SpO2: 94%    Eyes: Pupils are equal, round, and reactive to light. Pronounced scleral icterus bilateral.  Neck: Normal range of motion.  Cardiovascular: Normal rate and regular rhythm.  Pulmonary/Chest: Effort normal and breath sounds normal.  Abdominal: Soft. Bowel sounds are normal.  Musculoskeletal: Normal range of motion.  Neurological: She is alert and oriented to person, place, and  time.  Skin: Skin is warm and dry. Jaundice noted Psychiatric: She has a normal mood and affect. Her behavior is normal. Judgment and thought content normal.     Discharge Instructions     Medication List    ASK your doctor about these medications       albuterol (2.5 MG/3ML) 0.083% nebulizer solution  Commonly known as:  PROVENTIL  Take 3 mLs (2.5 mg total) by nebulization every 6 (six) hours as needed for shortness of breath.     cholecalciferol  1000 UNITS tablet  Commonly known as:  VITAMIN D  Take 1,000 Units by mouth daily.     diphenhydrAMINE 25 MG tablet  Commonly known as:  BENADRYL  Take 25 mg by mouth every 6 (six) hours as needed for itching.     Fluticasone-Salmeterol 250-50 MCG/DOSE Aepb  Commonly known as:  ADVAIR  Inhale 1 puff into the lungs daily.     folic acid 1 MG tablet  Commonly known as:  FOLVITE  Take 1 tablet (1 mg total) by mouth daily.     hydroxyurea 500 MG capsule  Commonly known as:  HYDREA  TAKE ONE CAPSULE BY MOUTH TWICE DAILY     ibuprofen 600 MG tablet  Commonly known as:  ADVIL,MOTRIN  Take 1 tablet (600 mg total) by mouth every 8 (eight) hours as needed for pain.     mometasone 50 MCG/ACT nasal spray  Commonly known as:  NASONEX  Place 2 sprays into the nose daily.     ondansetron 4 MG tablet  Commonly known as:  ZOFRAN  Take 1 tablet (4 mg total) by mouth every 6 (six) hours.     oxyCODONE-acetaminophen 10-325 MG per tablet  Commonly known as:  PERCOCET  Take 1 tablet by mouth every 6 (six) hours as needed for pain.     promethazine 25 MG tablet  Commonly known as:  PHENERGAN  Take 25 mg by mouth every 6 (six) hours as needed for nausea.          The results of significant diagnostics from this hospitalization (including imaging, microbiology, ancillary and laboratory) are listed below for reference.    Significant Diagnostic Studies: No results found.  Microbiology: No results found for this or any previous visit (from the past 240 hour(s)).   Labs: Basic Metabolic Panel:  Recent Labs Lab 03/03/14 0953  NA 138  K 4.0  CL 105  CO2 18*  GLUCOSE 101*  BUN 15  CREATININE 0.88  CALCIUM 9.1   Liver Function Tests:  Recent Labs Lab 03/03/14 0953  AST 130*  ALT 59*  ALKPHOS 378*  BILITOT 12.4*  PROT 8.0  ALBUMIN 3.1*   No results found for this basename: LIPASE, AMYLASE,  in the last 168 hours No results found for this basename: AMMONIA,  in the last  168 hours CBC:  Recent Labs Lab 03/02/14 1215 03/03/14 0953  WBC 9.5 8.3  NEUTROABS 4.3 4.3  HGB 9.2* 8.7*  HCT 25.2* 23.5*  MCV 107.2* 108.3*  PLT 209 176   Cardiac Enzymes: No results found for this basename: CKTOTAL, CKMB, CKMBINDEX, TROPONINI,  in the last 168 hours BNP: No components found with this basename: POCBNP,  CBG: No results found for this basename: GLUCAP,  in the last 168 hours Ferritin: No results found for this basename: FERRITIN,  in the last 168 hours  Time coordinating discharge: 45  Signed:  Massie MaroonLachina M Jiovanna Frei  03/03/2014, 4:53 PM

## 2014-03-04 NOTE — Discharge Summary (Signed)
The patient was treated for an acute sickle cell crisis.  She responded well to hypotonic IVFs and IV dilaudid.  She rarely requires IV narcotic medications but had failed to control her pain with oral percocet tablets at home.  She did not require PCA administration for this extended observation stay.  She was re-assessed prior to discharge home and felt that her pain had improved to the point where she felt she could manage it at home.  Her pruritus and nausea was well controlled after receiving the medications we provided.  She did have a bump in her bilirubin to 12.4 to explain the increased jaundice and scleral icterus as this is likely related to her crisis.  I agree with the assessment and plan as detailed by Julianne HandlerLachina Hollis, NP.  The patient was given instructions to seek medical care or RTC if symptoms worsened or new problems developed. The patient verbalized understanding.   Rodney Langton. L. Johnson, MD, CDE, FAAFP [facility], [place]

## 2014-03-07 ENCOUNTER — Telehealth: Payer: Self-pay | Admitting: Internal Medicine

## 2014-03-07 DIAGNOSIS — F411 Generalized anxiety disorder: Secondary | ICD-10-CM

## 2014-03-07 NOTE — Telephone Encounter (Signed)
Will refer patient to Dr. Caryl BisFunderberk for situational anxiety. Staff will notify patient with appointment time.

## 2014-03-07 NOTE — Telephone Encounter (Signed)
Patient called requesting referral for individual, not group counseling.

## 2014-03-08 ENCOUNTER — Telehealth: Payer: Self-pay

## 2014-03-08 ENCOUNTER — Other Ambulatory Visit: Payer: Self-pay

## 2014-03-08 NOTE — Telephone Encounter (Signed)
Pt was contacted and VM was left to contact office on behalf of info she requested due to Anxiety issues

## 2014-03-08 NOTE — Telephone Encounter (Signed)
Pt returned call to office concerning info about Anxiety issues.Pt was given info about [facility].Pt was told they would see her on a Walk-in Basis.Ph# as well as address were given to Pt.Pt verbalized understanding of this.

## 2014-03-29 ENCOUNTER — Other Ambulatory Visit: Payer: Medicaid Other

## 2014-03-29 ENCOUNTER — Other Ambulatory Visit: Payer: Self-pay | Admitting: Internal Medicine

## 2014-03-29 DIAGNOSIS — D571 Sickle-cell disease without crisis: Secondary | ICD-10-CM

## 2014-03-30 LAB — COMPREHENSIVE METABOLIC PANEL
ALT: 36 U/L — AB (ref 0–35)
AST: 93 U/L — ABNORMAL HIGH (ref 0–37)
Albumin: 3.7 g/dL (ref 3.5–5.2)
Alkaline Phosphatase: 365 U/L — ABNORMAL HIGH (ref 39–117)
BUN: 9 mg/dL (ref 6–23)
CALCIUM: 9.1 mg/dL (ref 8.4–10.5)
CHLORIDE: 108 meq/L (ref 96–112)
CO2: 19 mEq/L (ref 19–32)
Creat: 0.71 mg/dL (ref 0.50–1.10)
GLUCOSE: 137 mg/dL — AB (ref 70–99)
Potassium: 3.6 mEq/L (ref 3.5–5.3)
Sodium: 137 mEq/L (ref 135–145)
Total Bilirubin: 11.7 mg/dL — ABNORMAL HIGH (ref 0.2–1.2)
Total Protein: 7.5 g/dL (ref 6.0–8.3)

## 2014-03-30 LAB — CBC WITH DIFFERENTIAL/PLATELET
BASOS PCT: 3 % — AB (ref 0–1)
Basophils Absolute: 0.2 10*3/uL — ABNORMAL HIGH (ref 0.0–0.1)
Eosinophils Absolute: 0.2 10*3/uL (ref 0.0–0.7)
Eosinophils Relative: 3 % (ref 0–5)
HEMATOCRIT: 25.9 % — AB (ref 36.0–46.0)
HEMOGLOBIN: 9.3 g/dL — AB (ref 12.0–15.0)
Lymphocytes Relative: 38 % (ref 12–46)
Lymphs Abs: 2.6 10*3/uL (ref 0.7–4.0)
MCH: 38.9 pg — ABNORMAL HIGH (ref 26.0–34.0)
MCHC: 35.9 g/dL (ref 30.0–36.0)
MCV: 108.4 fL — ABNORMAL HIGH (ref 78.0–100.0)
MONO ABS: 0.7 10*3/uL (ref 0.1–1.0)
Monocytes Relative: 10 % (ref 3–12)
NEUTROS ABS: 3.2 10*3/uL (ref 1.7–7.7)
NEUTROS PCT: 46 % (ref 43–77)
Platelets: 209 10*3/uL (ref 150–400)
RBC: 2.39 MIL/uL — ABNORMAL LOW (ref 3.87–5.11)
RDW: 21.1 % — ABNORMAL HIGH (ref 11.5–15.5)
WBC: 6.9 10*3/uL (ref 4.0–10.5)

## 2014-04-05 ENCOUNTER — Ambulatory Visit: Payer: Medicaid Other | Admitting: Internal Medicine

## 2014-04-06 ENCOUNTER — Encounter: Payer: Self-pay | Admitting: Internal Medicine

## 2014-04-06 ENCOUNTER — Ambulatory Visit (INDEPENDENT_AMBULATORY_CARE_PROVIDER_SITE_OTHER): Payer: Medicaid Other | Admitting: Internal Medicine

## 2014-04-06 VITALS — BP 128/79 | HR 96 | Temp 98.6°F | Resp 20 | Ht 67.0 in | Wt 132.0 lb

## 2014-04-06 DIAGNOSIS — D571 Sickle-cell disease without crisis: Secondary | ICD-10-CM

## 2014-04-06 DIAGNOSIS — R8290 Unspecified abnormal findings in urine: Secondary | ICD-10-CM

## 2014-04-06 DIAGNOSIS — Z23 Encounter for immunization: Secondary | ICD-10-CM

## 2014-04-06 DIAGNOSIS — E559 Vitamin D deficiency, unspecified: Secondary | ICD-10-CM

## 2014-04-06 DIAGNOSIS — R51 Headache: Secondary | ICD-10-CM

## 2014-04-06 DIAGNOSIS — N926 Irregular menstruation, unspecified: Secondary | ICD-10-CM

## 2014-04-06 DIAGNOSIS — Z0000 Encounter for general adult medical examination without abnormal findings: Secondary | ICD-10-CM

## 2014-04-06 DIAGNOSIS — R82998 Other abnormal findings in urine: Secondary | ICD-10-CM

## 2014-04-06 DIAGNOSIS — R519 Headache, unspecified: Secondary | ICD-10-CM

## 2014-04-06 NOTE — Progress Notes (Signed)
Patient ID: Joan Martin, female   DOB: 26-Jun-1986, 28 y.o.   MRN: [id number]   Joan Martin, is a 28 y.o. female  [id number]  [id number]:[id number]  DOB - Feb 27, 1986  CC:  Chief Complaint  Patient presents with  . 6 MO F/U    Pt has complained of headaches on again off again this past wk. No pain as of now       HPI: Joan Martin is a 28 y.o. female here today for follow up. She has been having intermittent headaches which she describes as starting at the B/L mastoid process and radiating upwards.She also reports sharp pains behind both eyes which are intermittent. She has been getting inadequate amounts of sleep and reports that in fact her headaches may co-relate with her lack of sleep.    Patient currently has No headache, No chest pain, No abdominal pain - No Nausea, No new weakness tingling or numbness, No Cough - SOB.  Allergies  Allergen Reactions  . Cephalosporins     Possible family reaction  . Citrus Other (See Comments)    Severity varies/ Reaction varies hives around mouth to swelling around mouth and lips.   . Morphine And Related Hives    Patient states she can take IV dilaudid with benadryl   Past Medical History  Diagnosis Date  . Sickle cell anemia   . Stroke     7 years ago  . Sickle cell anemia   . Asthma    Current Outpatient Prescriptions on File Prior to Visit  Medication Sig Dispense Refill  . cholecalciferol (VITAMIN D) 1000 UNITS tablet Take 1,000 Units by mouth daily.      . Fluticasone-Salmeterol (ADVAIR) 250-50 MCG/DOSE AEPB Inhale 1 puff into the lungs daily.      . folic acid (FOLVITE) 1 MG tablet Take 1 tablet (1 mg total) by mouth daily.  30 tablet  11  . hydroxyurea (HYDREA) 500 MG capsule TAKE ONE CAPSULE BY MOUTH TWICE DAILY  180 capsule  0  . ibuprofen (ADVIL,MOTRIN) 600 MG tablet Take 1 tablet (600 mg total) by mouth every 8 (eight) hours as needed for pain.  30 tablet  0  . mometasone (NASONEX) 50 MCG/ACT nasal spray Place 2 sprays into  the nose daily.      Marland Kitchen albuterol (PROVENTIL) (2.5 MG/3ML) 0.083% nebulizer solution Take 3 mLs (2.5 mg total) by nebulization every 6 (six) hours as needed for shortness of breath.  75 mL  12  . diphenhydrAMINE (BENADRYL) 25 MG tablet Take 25 mg by mouth every 6 (six) hours as needed for itching.      . ondansetron (ZOFRAN) 4 MG tablet Take 1 tablet (4 mg total) by mouth every 6 (six) hours.  12 tablet  0  . oxyCODONE-acetaminophen (PERCOCET) 10-325 MG per tablet Take 1 tablet every 4 hours for severe pain  60 tablet  0  . promethazine (PHENERGAN) 25 MG tablet Take 25 mg by mouth every 6 (six) hours as needed for nausea.       Current Facility-Administered Medications on File Prior to Visit  Medication Dose Route Frequency Provider Last Rate Last Dose  . ondansetron (ZOFRAN) injection 4 mg  4 mg Intravenous Once Grayce Sessions, NP       Family History  Problem Relation Age of Onset  . Diabetes Mother   . Hypertension Mother   . Diabetes Father   . Hypertension Father   . Cancer Maternal Aunt    History  Social History  . Marital Status: Single    Spouse Name: N/A    Number of Children: N/A  . Years of Education: N/A   Occupational History  . Not on file.   Social History Main Topics  . Smoking status: Never Smoker   . Smokeless tobacco: Not on file  . Alcohol Use: No  . Drug Use: No  . Sexual Activity: Yes    Birth Control/ Protection: Condom   Other Topics Concern  . Not on file   Social History Narrative  . No narrative on file    Review of Systems: Constitutional: Negative for fever, chills, diaphoresis, activity change, appetite change and fatigue. HENT: Negative for ear pain, nosebleeds, congestion, facial swelling, rhinorrhea, neck pain, neck stiffness and ear discharge.  Eyes: Negative for pain, discharge, redness, itching and visual disturbance. Respiratory: Negative for cough, choking, chest tightness, shortness of breath, wheezing and stridor.   Cardiovascular: Negative for chest pain, palpitations and leg swelling. Gastrointestinal: Negative for abdominal distention. Genitourinary: Negative for dysuria, urgency, frequency, hematuria, flank pain, decreased urine volume, difficulty urinating and dyspareunia.  Musculoskeletal: Negative for back pain, joint swelling, arthralgia and gait problem. Neurological: Negative for dizziness, tremors, seizures, syncope, facial asymmetry, speech difficulty, weakness, light-headedness, numbness and headaches.  Hematological: Negative for adenopathy. Does not bruise/bleed easily. Psychiatric/Behavioral: Negative for hallucinations, behavioral problems, confusion, dysphoric mood, decreased concentration and agitation.    Objective:    Filed Vitals:   04/06/14 1006  BP: 128/79  Pulse: 96  Temp: 98.6 F (37 C)  Resp: 20    Physical Exam: Constitutional: Patient appears well-developed and well-nourished. No distress. HENT: Normocephalic, atraumatic, External right and left ear normal. Oropharynx is clear and moist.  Eyes: Conjunctivae and EOM are normal. PERRLA, no scleral icterus. Neck: Normal ROM. Neck supple. No JVD. No tracheal deviation. No thyromegaly.CVS: RRR, S1/S2 +, no murmurs, no gallops, no carotid bruit.  Pulmonary: Effort and breath sounds normal, no stridor, rhonchi, wheezes, rales.  Abdominal: Soft. BS +, no distension, tenderness, rebound or guarding.  Musculoskeletal: Normal range of motion. No edema and no tenderness.  Lymphadenopathy: No lymphadenopathy noted, cervical, inguinal or axillary Neuro: Alert. Normal reflexes, muscle tone coordination. No cranial nerve deficit. Negative Brudzinski and negative Kernig;s sign. Skin: Skin is warm and dry. No rash noted. Not diaphoretic. No erythema. No pallor. Psychiatric: Normal mood and affect. Behavior, judgment, thought content normal.  Lab Results  Component Value Date   WBC 6.9 03/29/2014   HGB 9.3* 03/29/2014   HCT 25.9*  03/29/2014   MCV 108.4* 03/29/2014   PLT 209 03/29/2014   Lab Results  Component Value Date   CREATININE 0.71 03/29/2014   BUN 9 03/29/2014   NA 137 03/29/2014   K 3.6 03/29/2014   CL 108 03/29/2014   CO2 19 03/29/2014    No results found for this basename: HGBA1C   Lipid Panel  No results found for this basename: chol, trig, hdl, cholhdl, vldl, ldlcalc       Assessment and plan:   1. Frequent headaches - Pt with headaches without concerning signs.She has no focal neurological deficits, no N/V or signs of increased intracranial pressure. 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 We have discussed the need for adequate sleep. She will work on improving sleep hygiene and keeping a headache diary. If she has no improvement in headache with sleep then will consider MRI/MRA to evaluate cerebral vessels.   2. Irregular periods/menstrual cycles -  Pt has had an occurrence of 2 menstrual periods within 2 weeks of each other, then a menstrual period 1 month after the last one. She has has had a retinal blood clot in the past and thus has a relative contraindication to OCP's furthermore, Ms [name] does not want to pursue contraception as she has considered the risks and feels that the risk outweighs the benefits for her. Will observe menstrual cycles for the next few months and make further decisions.  3. Need for Tdap vaccination - TDAP Vaccine given today - Tdap vaccine greater than or equal to 7yo IM  4. Hb SS without crisis - Sickle cell disease -  Pt currently not on MTD. She is resistant to increasing Hydrea dose. Continue Hydrea 500 mg twice daily. We discussed the need for good hydration, monitoring of hydration status, avoidance of heat, cold, stress, and infection triggers. We discussed the risks and benefits of Hydrea, including bone marrow suppression, the possibility of GI upset, skin ulcers, hair thinning, and teratogenicity. The patient was reminded of the need  to seek medical attention of any symptoms of bleeding, anemia, or infection. Continue folic acid 1 mg daily to prevent aplastic bone marrow crises.   - Pulmonary evaluation - Patient denies severe recurrent wheezes, shortness of breath with exercise, or persistent cough. If these symptoms develop, pulmonary function tests with spirometry will be ordered, and if abnormal, plan on referral to Pulmonology for further evaluation.  - Cardiac - Routine screening for pulmonary hypertension is not recommended.  -  Eye - High risk of proliferative retinopathy.  Pt has had eye examination in the last year  - Immunization status - She received TDAP vaccine today. Yearly influenza vaccination is recommended, as well as being up to date with Meningococcal and Pneumococcal vaccines.   -- Acute and chronic painful episodes - We agreed on Oxycodone-APAP 10-325 mg #60 pills/month. Pt has had very infrequent use of her opiate medications in the last month since the weather has improved.  We discussed that she is to receive her Schedule II prescriptions only from us. She is also aware that her prescription history is available to us online through the [facility] CSRS. Controlled substance agreement signed (Date). We reminded (Pt) that all patients receiving Schedule II narcotics must be seen for follow up every three months. We reviewed the terms of our pain agreement, including the need to keep medicines in a safe locked location away from children or pets, and the need to report excess sedation or constipation, measures to avoid constipation, and policies related to early refills and stolen prescriptions. According to the [place] [facility] program, we have reviewed details related to analgesia, adverse effects, aberrant behaviors.  - Iron overload from chronic transfusion.  Pt had a mild increase in Ferritin levels last year and has had no transfusions this year. Will check ferritin levels.   - Vitamin D deficiency -  Currently on Vitamin D supplementation. Will recheck levels.   The above recommendations are taken from the NIH Evidence-Based Management of Sickle Cell Disease: Expert Panel Report, [id number].   5. Preventative Health - Lipid panel    Follow-up  In 2 weeks  The patient was given clear instructions to go to ER or return to medical center if symptoms don't improve, worsen or new problems develop. The patient verbalized understanding. The patient was told to call to get lab results if they haven't heard anything in the next week.     This note  has been created with Education officer, environmental. Any transcriptional errors are unintentional.    MATTHEWS,MICHELLE A., MD [facility] [place], [place] [phone]   04/06/2014, 10:41 AM

## 2014-04-06 NOTE — Progress Notes (Signed)
Patient ID: Joan Martin, female   DOB: 05/25/1986, 28 y.o.   MRN: [id number] Order released for lipid panel during office visit on 04/06/14; pt had not been fasting prior to visit; pt states will return within one week for lab draw of lipid panel; new order for panel will need to be placed per previous order

## 2014-04-07 LAB — URINALYSIS
Glucose, UA: NEGATIVE mg/dL
HGB URINE DIPSTICK: NEGATIVE
NITRITE: POSITIVE — AB
PH: 6.5 (ref 5.0–8.0)
Protein, ur: NEGATIVE mg/dL
SPECIFIC GRAVITY, URINE: 1.012 (ref 1.005–1.030)
Urobilinogen, UA: 4 mg/dL — ABNORMAL HIGH (ref 0.0–1.0)

## 2014-04-10 ENCOUNTER — Encounter: Payer: Self-pay | Admitting: Internal Medicine

## 2014-04-10 NOTE — Progress Notes (Addendum)
Patient ID: Joan Martin, female   DOB: 06/12/1986, 28 y.o.   MRN: [id number] Notified patient of positive nitrites in urine that may indicate UTI. She denies any symptoms and requests a re-evaluation of urine and I will also send a urine culture at that time.

## 2014-04-10 NOTE — Addendum Note (Signed)
Addended by: Marthann SchillerMATTHEWS, MICHELLE A on: 04/10/2014 05:59 PM   Modules accepted: Orders

## 2014-04-11 ENCOUNTER — Other Ambulatory Visit: Payer: Self-pay | Admitting: Internal Medicine

## 2014-04-11 ENCOUNTER — Other Ambulatory Visit: Payer: Self-pay

## 2014-04-11 DIAGNOSIS — R8290 Unspecified abnormal findings in urine: Secondary | ICD-10-CM

## 2014-04-13 LAB — URINE CULTURE
Colony Count: NO GROWTH
Organism ID, Bacteria: NO GROWTH

## 2014-04-14 ENCOUNTER — Non-Acute Institutional Stay (HOSPITAL_COMMUNITY)
Admission: AD | Admit: 2014-04-14 | Discharge: 2014-04-14 | Disposition: A | Discharge status: Home / Self Care | Payer: Medicaid Other | Attending: Internal Medicine | Admitting: Internal Medicine

## 2014-04-14 ENCOUNTER — Encounter (HOSPITAL_COMMUNITY): Payer: Self-pay | Admitting: Hematology

## 2014-04-14 DIAGNOSIS — Z8673 Personal history of transient ischemic attack (TIA), and cerebral infarction without residual deficits: Secondary | ICD-10-CM | POA: Insufficient documentation

## 2014-04-14 DIAGNOSIS — J45909 Unspecified asthma, uncomplicated: Secondary | ICD-10-CM | POA: Diagnosis not present

## 2014-04-14 DIAGNOSIS — D5700 Hb-SS disease with crisis, unspecified: Secondary | ICD-10-CM | POA: Diagnosis not present

## 2014-04-14 DIAGNOSIS — M545 Low back pain, unspecified: Secondary | ICD-10-CM | POA: Insufficient documentation

## 2014-04-14 DIAGNOSIS — Z79899 Other long term (current) drug therapy: Secondary | ICD-10-CM | POA: Diagnosis not present

## 2014-04-14 DIAGNOSIS — M79609 Pain in unspecified limb: Secondary | ICD-10-CM | POA: Insufficient documentation

## 2014-04-14 LAB — COMPREHENSIVE METABOLIC PANEL
ALT: 48 U/L — ABNORMAL HIGH (ref 0–35)
AST: 114 U/L — ABNORMAL HIGH (ref 0–37)
Albumin: 3.2 g/dL — ABNORMAL LOW (ref 3.5–5.2)
Alkaline Phosphatase: 418 U/L — ABNORMAL HIGH (ref 39–117)
BUN: 8 mg/dL (ref 6–23)
CO2: 19 mEq/L (ref 19–32)
Calcium: 9.1 mg/dL (ref 8.4–10.5)
Chloride: 101 mEq/L (ref 96–112)
Creatinine, Ser: 0.7 mg/dL (ref 0.50–1.10)
GFR calc Af Amer: >90 mL/min (ref >90)
GFR calc non Af Amer: >90 mL/min (ref >90)
Glucose, Bld: 104 mg/dL — ABNORMAL HIGH (ref 70–99)
Potassium: 4 mEq/L (ref 3.7–5.3)
Sodium: 135 mEq/L — ABNORMAL LOW (ref 137–147)
Total Bilirubin: 12.8 mg/dL — ABNORMAL HIGH (ref 0.3–1.2)
Total Protein: 8.3 g/dL (ref 6.0–8.3)

## 2014-04-14 LAB — CBC WITH DIFFERENTIAL/PLATELET
Basophils Absolute: 0.2 10*3/uL — ABNORMAL HIGH (ref 0.0–0.1)
Basophils Relative: 3 % — ABNORMAL HIGH (ref 0–1)
EOS PCT: 3 % (ref 0–5)
Eosinophils Absolute: 0.2 10*3/uL (ref 0.0–0.7)
HEMATOCRIT: 26.3 % — AB (ref 36.0–46.0)
Hemoglobin: 9.7 g/dL — ABNORMAL LOW (ref 12.0–15.0)
LYMPHS PCT: 33 % (ref 12–46)
Lymphs Abs: 2.6 10*3/uL (ref 0.7–4.0)
MCH: 36 pg — ABNORMAL HIGH (ref 26.0–34.0)
MCHC: 37.5 g/dL — ABNORMAL HIGH (ref 30.0–36.0)
MCV: 102.7 fL — AB (ref 78.0–100.0)
Monocytes Absolute: 0.9 10*3/uL (ref 0.1–1.0)
Monocytes Relative: 12 % (ref 3–12)
NEUTROS ABS: 3.9 10*3/uL (ref 1.7–7.7)
NRBC: 24 /100{WBCs} — AB
Neutrophils Relative %: 49 % (ref 43–77)
Platelets: 170 10*3/uL (ref 150–400)
RBC: 2.56 MIL/uL — ABNORMAL LOW (ref 3.87–5.11)
RDW: 21.2 % — ABNORMAL HIGH (ref 11.5–15.5)
WBC: 7.8 10*3/uL (ref 4.0–10.5)

## 2014-04-14 LAB — RETICULOCYTES: RBC.: 2.56 MIL/uL — ABNORMAL LOW (ref 3.87–5.11)

## 2014-04-14 LAB — LACTATE DEHYDROGENASE: LDH: 969 U/L — AB (ref 94–250)

## 2014-04-14 MED ORDER — KETOROLAC TROMETHAMINE 30 MG/ML IJ SOLN
30.0000 mg | Freq: Once | INTRAMUSCULAR | Status: AC
  Administered 2014-04-14: 30 mg via INTRAVENOUS
  Filled 2014-04-14: qty 1

## 2014-04-14 MED ORDER — OXYCODONE HCL 5 MG PO TABS
10.0000 mg | ORAL_TABLET | Freq: Once | ORAL | Status: AC
Start: 2014-04-14 — End: 2014-04-14
  Administered 2014-04-14: 10 mg via ORAL
  Filled 2014-04-14: qty 2

## 2014-04-14 MED ORDER — DEXTROSE-NACL 5-0.45 % IV SOLN
INTRAVENOUS | Status: DC
  Administered 2014-04-14: 17:00:00 via INTRAVENOUS

## 2014-04-14 MED ORDER — HYDROMORPHONE HCL PF 2 MG/ML IJ SOLN
0.8000 mg | Freq: Once | INTRAMUSCULAR | Status: AC
  Administered 2014-04-14: 0.8 mg via INTRAVENOUS
  Filled 2014-04-14: qty 1

## 2014-04-14 MED ORDER — DIPHENHYDRAMINE HCL 25 MG PO CAPS
25.0000 mg | ORAL_CAPSULE | Freq: Once | ORAL | Status: AC
  Administered 2014-04-14: 25 mg via ORAL
  Filled 2014-04-14: qty 1

## 2014-04-14 NOTE — H&P (Signed)
[facility] History and Physical  Joan Martin [id number] DOB: 11/18/1985 DOA: 04/14/2014   PCP: MATTHEWS,MICHELLE A., MD   Chief Complaint: Pain in back and limbs  WJX:[id number]:Joan Martin is a 28 yo female with sickle-cell disease. She reports 4 days of back, leg, and arm pain. She was trying to control pain at home by taking Percocet and hydrating often. She presented to clinic a few days ago when pain first started, but notes that pain was has not improved since then. She rates her pain as a 6-7/10 throbbing pain. Yesterday she took Percocet three times without any resolution.  She  Denies any fever, cough, SOB, n/v, diarrhea, rash. She reports having a headache that is at baseline for her.    Review of Systems:  Constitutional: No weight loss, night sweats, Fevers, chills, fatigue.  HEENT: No headaches, dizziness, seizures, vision changes, difficulty swallowing,Tooth/dental problems,Sore throat, No sneezing, itching, ear ache, nasal congestion, post nasal drip,  Cardio-vascular: No chest pain, Orthopnea, PND, swelling in lower extremities, anasarca, dizziness, palpitations  GI: No heartburn, indigestion, abdominal pain, nausea, vomiting, diarrhea, change in bowel habits, loss of appetite  Resp: No shortness of breath with exertion or at rest. No excess mucus, no productive cough, No non-productive cough, No coughing up of blood.No change in color of mucus.No wheezing.No chest wall deformity  Skin: no rash or lesions.  GU: no dysuria, change in color of urine, no urgency or frequency. No flank pain.  Musculoskeletal: No joint pain or swelling. No decreased range of motion.  Psych: No change in mood or affect. No depression or anxiety. No memory loss.    Past Medical History  Diagnosis Date  . Sickle cell anemia   . Stroke     7 years ago  . Sickle cell anemia   . Asthma    Past Surgical History  Procedure Laterality Date  . Cholecystectomy     Social History:  reports  that she has never smoked. She does not have any smokeless tobacco history on file. She reports that she does not drink alcohol or use illicit drugs.  Allergies  Allergen Reactions  . Cephalosporins     Possible family reaction  . Citrus Other (See Comments)    Severity varies/ Reaction varies hives around mouth to swelling around mouth and lips.   . Morphine And Related Hives    Patient states she can take IV dilaudid with benadryl    Family History  Problem Relation Age of Onset  . Diabetes Mother   . Hypertension Mother   . Diabetes Father   . Hypertension Father   . Cancer Maternal Aunt     Prior to Admission medications   Medication Sig Start Date End Date Taking? Authorizing Provider  albuterol (PROVENTIL) (2.5 MG/3ML) 0.083% nebulizer solution Take 3 mLs (2.5 mg total) by nebulization every 6 (six) hours as needed for shortness of breath. 12/01/13  Yes Massie MaroonLachina M Hollis, FNP  cholecalciferol (VITAMIN D) 1000 UNITS tablet Take 1,000 Units by mouth daily.   Yes Historical Provider, MD  diphenhydrAMINE (BENADRYL) 25 MG tablet Take 25 mg by mouth every 6 (six) hours as needed for itching.   Yes Historical Provider, MD  Fluticasone-Salmeterol (ADVAIR) 250-50 MCG/DOSE AEPB Inhale 1 puff into the lungs daily.   Yes Historical Provider, MD  folic acid (FOLVITE) 1 MG tablet Take 1 tablet (1 mg total) by mouth daily. 01/03/14  Yes Massie MaroonLachina M Hollis, FNP  hydroxyurea (HYDREA) 500 MG capsule TAKE ONE CAPSULE  BY MOUTH TWICE DAILY 02/23/14  Yes Altha HarmMichelle A Matthews, MD  ibuprofen (ADVIL,MOTRIN) 600 MG tablet Take 1 tablet (600 mg total) by mouth every 8 (eight) hours as needed for pain. 07/27/13  Yes Grayce SessionsMichelle P Edwards, NP  mometasone (NASONEX) 50 MCG/ACT nasal spray Place 2 sprays into the nose daily.   Yes Historical Provider, MD  ondansetron (ZOFRAN) 4 MG tablet Take 1 tablet (4 mg total) by mouth every 6 (six) hours. 07/23/13  Yes Jamesetta Orleanshristopher W Lawyer, PA-C  oxyCODONE-acetaminophen (PERCOCET)  10-325 MG per tablet Take 1 tablet every 4 hours for severe pain 03/03/14  Yes Massie MaroonLachina M Hollis, FNP  promethazine (PHENERGAN) 25 MG tablet Take 25 mg by mouth every 6 (six) hours as needed for nausea.   Yes Historical Provider, MD   Physical Exam: Filed Vitals:   04/14/14 1622  BP: 135/85  Pulse: 102  Temp: 99.2 F (37.3 C)  TempSrc: Oral  Resp: 20  Height: 5\' 7"  (1.702 m)  Weight: 132 lb (59.875 kg)  SpO2: 100%   Gen: alert, sitting up in bed, NAD  HEENT: PERLA, 2+ scleral icterus, mmm, nl oropharynx without lesions  CV: rrr, nl s1 and s2, no mumurs  Resp: CTAB  Abdomen: +BS, full, NT, ND  Extrem: No cyanosis, no edema, +clubbing  Neuro: A+OX3, 5/5 distal strength, nl sensory MSK: tenderness on lower back and limbs  Labs on Admission:   Basic Metabolic Panel:  Recent Labs Lab 04/14/14 1643  NA 135*  K 4.0  CL 101  CO2 19  GLUCOSE 104*  BUN 8  CREATININE 0.70  CALCIUM 9.1   Liver Function Tests:  Recent Labs Lab 04/14/14 1643  AST 114*  ALT 48*  ALKPHOS 418*  BILITOT 12.8*  PROT 8.3  ALBUMIN 3.2*   CBC: pending  Assessment/Plan: Principal Problem:   Sickle cell crisis Pt will be treated with IV fluids D5/0.45% NaCl. She was given a dose of benadryl prior to Dilaudid. Pt is opioid naive, so a decreased dose of 0.8mg  of Dilaudid was given initially. Oxycodone 10mg  dose was also given once prior to discharge. She will be reassessed for pain in the context of function and relationship to baseline as care progresses.   Time spend: >1 hour Code Status: Full Code Family Communication: none Disposition Plan: Home after acute treatment with IV fluids and pain meds  Serina CowperAGBOOLA, OLABUNMI, MD  Pager [phone]  If 7PM-7AM, please contact night-coverage [email or web address] Password [email or web address] 04/14/2014, 6:40 PM

## 2014-04-14 NOTE — Discharge Instructions (Signed)
If pain worsens try home management, but if it can not be controlled please come to the emergency room. Sickle Cell Anemia, Adult Sickle cell anemia is a condition in which red blood cells have an abnormal "sickle" shape. This abnormal shape shortens the cells' life span, which results in a lower than normal concentration of red blood cells in the blood. The sickle shape also causes the cells to clump together and block free blood flow through the blood vessels. As a result, the tissues and organs of the body do not receive enough oxygen. Sickle cell anemia causes organ damage and pain and increases the risk of infection. CAUSES  Sickle cell anemia is a genetic disorder. Those who receive two copies of the gene have the condition, and those who receive one copy have the trait. RISK FACTORS The sickle cell gene is most common in people whose families originated in [place]. Other areas of the globe where sickle cell trait occurs include the [place], [place], the [place], and the [place].  SIGNS AND SYMPTOMS  Pain, especially in the extremities, back, chest, or abdomen (common). The pain may start suddenly or may develop following an illness, especially if there is dehydration. Pain can also occur due to overexertion or exposure to extreme temperature changes.  Frequent severe bacterial infections, especially certain types of pneumonia and meningitis.  Pain and swelling in the hands and feet.  Decreased activity.   Loss of appetite.   Change in behavior.  Headaches.  Seizures.  Shortness of breath or difficulty breathing.  Vision changes.  Skin ulcers. Those with the trait may not have symptoms or they may have mild symptoms.  DIAGNOSIS  Sickle cell anemia is diagnosed with blood tests that demonstrate the genetic trait. It is often diagnosed during the newborn period, due to mandatory testing nationwide. A variety of blood tests, X-rays, CT scans, MRI  scans, ultrasounds, and lung function tests may also be done to monitor the condition. TREATMENT  Sickle cell anemia may be treated with:  Medicines. You may be given pain medicines, antibiotic medicines (to treat and prevent infections) or medicines to increase the production of certain types of hemoglobin.  Fluids.  Oxygen.  Blood transfusions. HOME CARE INSTRUCTIONS   Drink enough fluid to keep your urine clear or pale yellow. Increase your fluid intake in hot weather and during exercise.  Do not smoke. Smoking lowers oxygen levels in the blood.   Only take over-the-counter or prescription medicines for pain, fever, or discomfort as directed by your health care provider.  Take antibiotics as directed by your health care provider. Make sure you finish them it even if you start to feel better.   Take supplements as directed by your health care provider.   Consider wearing a medical alert bracelet. This tells anyone caring for you in an emergency of your condition.   When traveling, keep your medical information, health care provider's names, and the medicines you take with you at all times.   If you develop a fever, do not take medicines to reduce the fever right away. This could cover up a problem that is developing. Notify your health care provider.  Keep all follow-up appointments with your health care provider. Sickle cell anemia requires regular medical care. SEEK MEDICAL CARE IF: You have a fever. SEEK IMMEDIATE MEDICAL CARE IF:   You feel dizzy or faint.   You have new abdominal pain, especially on the left side near the stomach area.  You develop a persistent, often uncomfortable and painful penile erection (priapism). If this is not treated immediately it will lead to impotence.   You have numbness your arms or legs or you have a hard time moving them.   You have a hard time with speech.   You have a fever or persistent symptoms for more than 2-3  days.   You have a fever and your symptoms suddenly get worse.   You have signs or symptoms of infection. These include:   Chills.   Abnormal tiredness (lethargy).   Irritability.   Poor eating.   Vomiting.   You develop pain that is not helped with medicine.   You develop shortness of breath.  You have pain in your chest.   You are coughing up pus-like or bloody sputum.   You develop a stiff neck.  Your feet or hands swell or have pain.  Your abdomen appears bloated.  You develop joint pain. MAKE SURE YOU:  Understand these instructions.  Will watch your child's condition.  Will get help right away if your child is not doing well or gets worse. Document Released: 01/14/2006 Document Revised: 07/27/2013 Document Reviewed: 05/18/2013 [facility] Patient Information 2015 [place]. This information is not intended to replace advice given to you by your health care provider. Make sure you discuss any questions you have with your health care provider.

## 2014-04-14 NOTE — Progress Notes (Signed)
Patient's pain level now down to 3/10.  IV fluids stopped, IV removed. Discharge instructions given.

## 2014-04-14 NOTE — Discharge Summary (Signed)
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 She reports 4 days of back, leg, and arm pain. She was trying to control pain at home by taking Percocet and hydrating often. She presented to clinic a few days ago when pain first started, but notes that pain was has not improved since then. She rates her pain as a 6-7/10 throbbing pain. Yesterday she took Percocet three times without any resolution. She Denies any fever, cough, SOB, n/v, diarrhea, rash. She reports having a headache that is at baseline for her.    Hospital Course:  Pt was treated with about 250cc of IV fluids D5/0.45% NaCl. She was given a dose of benadryl prior to Dilaudid. Pt is opioid naive, so a decreased dose of 0.8mg  of Dilaudid was given initially. She was also given Ketorolac 30mg  IV once. Her pain was brought down from a 7/10 to 3/10. Oxycodone 10mg  dose was also given once prior to discharge. Pt advised to come to ER over the weekend if pain management at home is ineffective.  Discharge Exam:  Filed Vitals:   04/14/14 1622  BP: 135/85  Pulse: 102  Temp: 99.2 F (37.3 C)  Resp: 20   Filed Vitals:   04/14/14 1622  BP: 135/85  Pulse: 102  Temp: 99.2 F (37.3 C)  TempSrc: Oral  Resp: 20  Height: 5\' 7"  (1.702 m)  Weight: 132 lb (59.875 kg)  SpO2: 100%   Gen: alert, sitting up in bed, NAD  HEENT: PERLA, 2+ scleral icterus, mmm, nl oropharynx without lesions  CV: rrr, nl s1 and s2, no mumurs  Resp: CTAB  Abdomen: +BS, full, NT, ND   Extrem: No cyanosis, no edema, +clubbing  Neuro: A+OX3, 5/5 distal strength, nl sensory  MSK: tenderness on lower back and limbs  Discharge Instructions As above    Medication List         albuterol (2.5 MG/3ML) 0.083% nebulizer solution  Commonly known as:  PROVENTIL  Take 3 mLs (2.5 mg total) by nebulization every 6 (six) hours as needed for shortness of breath.     cholecalciferol 1000 UNITS tablet  Commonly known as:  VITAMIN D  Take 1,000 Units by mouth daily.     diphenhydrAMINE 25 MG tablet  Commonly known as:  BENADRYL  Take 25 mg by mouth every 6 (six) hours as needed for itching.     Fluticasone-Salmeterol 250-50 MCG/DOSE Aepb  Commonly known as:  ADVAIR  Inhale 1 puff into the lungs daily.     folic acid 1 MG tablet  Commonly known as:  FOLVITE  Take 1 tablet (1 mg total) by mouth daily.     hydroxyurea 500 MG capsule  Commonly known as:  HYDREA  TAKE ONE CAPSULE BY MOUTH TWICE DAILY     ibuprofen 600 MG tablet  Commonly known as:  ADVIL,MOTRIN  Take 1 tablet (600 mg total) by mouth every 8 (eight) hours as needed for pain.     mometasone 50 MCG/ACT nasal spray  Commonly known as:  NASONEX  Place 2 sprays into the nose daily.     ondansetron 4 MG tablet  Commonly known as:  ZOFRAN  Take 1 tablet (4 mg total) by mouth every 6 (six) hours.     oxyCODONE-acetaminophen 10-325 MG per tablet  Commonly known as:  PERCOCET  Take 1 tablet every 4 hours for severe pain     promethazine 25 MG tablet  Commonly known as:  PHENERGAN  Take 25 mg by mouth every 6 (six) hours as needed for nausea.          The results of significant diagnostics from this hospitalization (including imaging, microbiology, ancillary and laboratory) are listed below for reference.    Significant Diagnostic Studies: No results found.  Microbiology: Recent Results (from the past 240 hour(s))  URINE CULTURE     Status: None   Collection Time    04/11/14  9:09 AM      Result  Value Ref Range Status   Colony Count NO GROWTH   Final   Organism ID, Bacteria NO GROWTH   Final     Labs: Basic Metabolic Panel:  Recent Labs Lab 04/14/14 1643  NA 135*  K 4.0  CL 101  CO2 19  GLUCOSE 104*  BUN 8  CREATININE 0.70  CALCIUM 9.1   Liver Function Tests:  Recent Labs Lab 04/14/14 1643  AST 114*  ALT 48*  ALKPHOS 418*  BILITOT 12.8*  PROT 8.3  ALBUMIN 3.2*   CBC-pending  Time coordinating discharge: 30 minutes  Signed:  Serina CowperGBOOLA, OLABUNMI  04/14/2014, 6:47 PM

## 2014-04-17 ENCOUNTER — Telehealth (HOSPITAL_COMMUNITY): Payer: Self-pay | Admitting: Internal Medicine

## 2014-04-17 ENCOUNTER — Telehealth (HOSPITAL_COMMUNITY): Payer: Self-pay | Admitting: *Deleted

## 2014-04-17 NOTE — Telephone Encounter (Signed)
Notified pt that, per MD, she does not need to come to day hospital if not in any pain currently

## 2014-04-17 NOTE — Telephone Encounter (Signed)
Received patient call. Patient states she came in [facility] for sickle cell pain on Friday June 26 and has appointment scheduled to come in today at 1230 for IV fluids. Patient states she did not have pain Saturday or yesterday and is not having pain at this time. Patient is inquiring if she still needs to come in today for IV fluids. Informed patient that Armeniahina NP comes in at 11am and this RN will notify her at that time and patient is to receive a return call back with recommendation. Patient agrees.

## 2014-04-27 ENCOUNTER — Telehealth: Payer: Self-pay

## 2014-04-27 NOTE — Telephone Encounter (Signed)
Pt contacted office today and stated she has Headache Pain as well as a low grade fever. Pt wanted to know if she could be seen by Dr. Ashley RoyaltyMatthews today.Msg. was passed on to Dr. She stated she could not fit any one else on to her Sch.today. NP was advised of situation and asked RMA (C.Xzavier Swinger)to make Ph call to find out if Pt wanted to be seen on the [facility] side to be treated. Pt was contacted,she refused.Pt stated she did not want to stay all day and was feeling better.Pt stated she was going home to rest straight from work,Pt stated she would call office on Monday to give update.

## 2014-05-01 ENCOUNTER — Non-Acute Institutional Stay (HOSPITAL_COMMUNITY)
Admission: AD | Admit: 2014-05-01 | Discharge: 2014-05-01 | Disposition: A | Discharge status: Home / Self Care | Payer: Medicaid Other | Attending: Internal Medicine | Admitting: Internal Medicine

## 2014-05-01 ENCOUNTER — Telehealth (HOSPITAL_COMMUNITY): Payer: Self-pay | Admitting: Internal Medicine

## 2014-05-01 ENCOUNTER — Encounter (HOSPITAL_COMMUNITY): Payer: Self-pay | Admitting: Internal Medicine

## 2014-05-01 DIAGNOSIS — D5700 Hb-SS disease with crisis, unspecified: Secondary | ICD-10-CM | POA: Diagnosis present

## 2014-05-01 DIAGNOSIS — J45909 Unspecified asthma, uncomplicated: Secondary | ICD-10-CM | POA: Diagnosis not present

## 2014-05-01 DIAGNOSIS — Z8673 Personal history of transient ischemic attack (TIA), and cerebral infarction without residual deficits: Secondary | ICD-10-CM | POA: Insufficient documentation

## 2014-05-01 DIAGNOSIS — M545 Low back pain, unspecified: Secondary | ICD-10-CM | POA: Diagnosis present

## 2014-05-01 DIAGNOSIS — Z79899 Other long term (current) drug therapy: Secondary | ICD-10-CM | POA: Diagnosis not present

## 2014-05-01 DIAGNOSIS — D57819 Other sickle-cell disorders with crisis, unspecified: Secondary | ICD-10-CM | POA: Diagnosis present

## 2014-05-01 LAB — CBC WITH DIFFERENTIAL/PLATELET
BASOS PCT: 3 % — AB (ref 0–1)
Basophils Absolute: 0.3 10*3/uL — ABNORMAL HIGH (ref 0.0–0.1)
Eosinophils Absolute: 0.1 10*3/uL (ref 0.0–0.7)
Eosinophils Relative: 1 % (ref 0–5)
HEMATOCRIT: 20.3 % — AB (ref 36.0–46.0)
Hemoglobin: 7.6 g/dL — ABNORMAL LOW (ref 12.0–15.0)
LYMPHS PCT: 27 % (ref 12–46)
Lymphs Abs: 2.5 10*3/uL (ref 0.7–4.0)
MCH: 37.6 pg — AB (ref 26.0–34.0)
MCHC: 37.4 g/dL — AB (ref 30.0–36.0)
MCV: 100.5 fL — AB (ref 78.0–100.0)
MONO ABS: 1.4 10*3/uL — AB (ref 0.1–1.0)
Monocytes Relative: 15 % — ABNORMAL HIGH (ref 3–12)
NEUTROS ABS: 5 10*3/uL (ref 1.7–7.7)
Neutrophils Relative %: 54 % (ref 43–77)
Platelets: 167 10*3/uL (ref 150–400)
RBC: 2.02 MIL/uL — ABNORMAL LOW (ref 3.87–5.11)
RDW: 24 % — AB (ref 11.5–15.5)
WBC: 9.3 10*3/uL (ref 4.0–10.5)
nRBC: 29 /100 WBC — ABNORMAL HIGH

## 2014-05-01 LAB — RETICULOCYTES
RBC.: 2.02 MIL/uL — ABNORMAL LOW (ref 3.87–5.11)
Retic Ct Pct: >23 % — ABNORMAL HIGH (ref 0.4–3.1)

## 2014-05-01 LAB — COMPREHENSIVE METABOLIC PANEL
ALT: 32 U/L (ref 0–35)
AST: 107 U/L — ABNORMAL HIGH (ref 0–37)
Albumin: 3.1 g/dL — ABNORMAL LOW (ref 3.5–5.2)
Alkaline Phosphatase: 374 U/L — ABNORMAL HIGH (ref 39–117)
Anion gap: 14 (ref 5–15)
BILIRUBIN TOTAL: 14.6 mg/dL — AB (ref 0.3–1.2)
BUN: 11 mg/dL (ref 6–23)
CALCIUM: 9.5 mg/dL (ref 8.4–10.5)
CHLORIDE: 102 meq/L (ref 96–112)
CO2: 19 meq/L (ref 19–32)
Creatinine, Ser: 0.93 mg/dL (ref 0.50–1.10)
GFR calc Af Amer: >90 mL/min (ref >90)
GFR, EST NON AFRICAN AMERICAN: 82 mL/min — AB (ref >90)
Glucose, Bld: 110 mg/dL — ABNORMAL HIGH (ref 70–99)
Potassium: 4.1 mEq/L (ref 3.7–5.3)
Sodium: 135 mEq/L — ABNORMAL LOW (ref 137–147)
Total Protein: 7.9 g/dL (ref 6.0–8.3)

## 2014-05-01 LAB — LACTATE DEHYDROGENASE: LDH: 998 U/L — ABNORMAL HIGH (ref 94–250)

## 2014-05-01 MED ORDER — DEXTROSE-NACL 5-0.45 % IV SOLN
INTRAVENOUS | Status: DC
  Administered 2014-05-01: 15:00:00 via INTRAVENOUS

## 2014-05-01 MED ORDER — KETOROLAC TROMETHAMINE 30 MG/ML IJ SOLN
30.0000 mg | Freq: Once | INTRAMUSCULAR | Status: AC
  Administered 2014-05-01: 30 mg via INTRAVENOUS
  Filled 2014-05-01: qty 1

## 2014-05-01 MED ORDER — ONDANSETRON HCL 4 MG PO TABS
4.0000 mg | ORAL_TABLET | Freq: Once | ORAL | Status: AC
  Administered 2014-05-01: 4 mg via ORAL
  Filled 2014-05-01: qty 1

## 2014-05-01 MED ORDER — HYDROMORPHONE HCL PF 2 MG/ML IJ SOLN
0.5000 mg | Freq: Once | INTRAMUSCULAR | Status: AC
  Administered 2014-05-01: 0.5 mg via INTRAVENOUS
  Filled 2014-05-01: qty 1

## 2014-05-01 MED ORDER — HYDROMORPHONE HCL PF 2 MG/ML IJ SOLN
1.0000 mg | Freq: Once | INTRAMUSCULAR | Status: AC
  Administered 2014-05-01: 1 mg via INTRAVENOUS
  Filled 2014-05-01: qty 1

## 2014-05-01 NOTE — Progress Notes (Signed)
Patient rating pain 0/10. No complaints. VSS. Patient discharged home, discharge instruction reviewed, patient acknowlegdes. Instructed patient to seek medical attention if any changes in condition, patient acknowledges. Patient left day hospital with belongings ambulatory via self.

## 2014-05-01 NOTE — H&P (Signed)
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 She states that pain started yesterday. She was taking Percocet around the clock but has found no relief in pain. She denies pain elsewhere and likens pain to her sickle cell pain. She denies any cough, CP, SOB, vomiting, diarrhea, or fever. She rates her pain as a 5-6/10 currently but took Percocet recently. She presents to the day hospital for further pain management.   Review of Systems:  Constitutional: No weight loss, night sweats, Fevers, chills, fatigue.  HEENT: No headaches, dizziness, seizures, vision changes, difficulty swallowing,Tooth/dental problems,Sore throat, No sneezing, itching, ear ache, nasal congestion, post nasal drip,  Cardio-vascular: No chest pain, Orthopnea, PND, swelling in lower extremities, anasarca, dizziness, palpitations  GI: No heartburn, indigestion, abdominal pain, nausea, vomiting, diarrhea, change in bowel habits, loss of appetite  Resp: No shortness of breath with exertion or at rest. No excess mucus, no productive cough, No non-productive cough, No coughing up of blood.No change in color of mucus.No wheezing.No chest wall deformity  Skin: no rash or lesions.  GU: no dysuria, change in color of urine, no urgency or frequency. No flank pain.  Musculoskeletal: No joint pain or swelling. No decreased range of motion. + back pain.  Psych: No change in mood or affect. No depression or anxiety. No memory loss.    Past Medical History  Diagnosis Date  . Sickle cell anemia   . Stroke     7 years ago  . Sickle cell anemia   . Asthma    Past Surgical History  Procedure Laterality Date  . Cholecystectomy     Social History:  reports that she has never smoked. She does not have any smokeless  tobacco history on file. She reports that she does not drink alcohol or use illicit drugs.  Allergies  Allergen Reactions  . Cephalosporins     Possible family reaction  . Citrus Other (See Comments)    Severity varies/ Reaction varies hives around mouth to swelling around mouth and lips.   . Morphine And Related Hives    Patient states she can take IV dilaudid with benadryl    Family History  Problem Relation Age of Onset  . Diabetes Mother   . Hypertension Mother   . Diabetes Father   . Hypertension Father   . Cancer Maternal Aunt     Prior to Admission medications   Medication Sig Start Date End Date Taking? Authorizing Provider  cholecalciferol (VITAMIN D) 1000 UNITS tablet Take 1,000 Units by mouth daily.   Yes Historical Provider, MD  diphenhydrAMINE (BENADRYL) 25 MG tablet Take 25 mg by mouth every 6 (six) hours as needed for itching.   Yes Historical Provider, MD  Fluticasone-Salmeterol (ADVAIR) 250-50 MCG/DOSE AEPB Inhale 1 puff into the lungs daily.   Yes Historical Provider, MD  folic acid (FOLVITE) 1 MG tablet Take 1 tablet (1 mg total) by mouth daily. 01/03/14  Yes Massie Maroon, FNP  hydroxyurea (HYDREA) 500 MG capsule TAKE ONE CAPSULE BY MOUTH TWICE DAILY 02/23/14  Yes Altha Harm, MD  ibuprofen (ADVIL,MOTRIN) 600 MG tablet Take 1 tablet (600 mg total) by mouth every 8 (eight) hours as needed for pain. 07/27/13  Yes Grayce Sessions, NP  mometasone (NASONEX) 50 MCG/ACT nasal spray  Place 2 sprays into the nose daily.   Yes Historical Provider, MD  ondansetron (ZOFRAN) 4 MG tablet Take 1 tablet (4 mg total) by mouth every 6 (six) hours. 07/23/13  Yes Jamesetta Orleanshristopher W Lawyer, PA-C  oxyCODONE-acetaminophen (PERCOCET) 10-325 MG per tablet Take 1 tablet every 4 hours for severe pain 03/03/14  Yes Massie MaroonLachina M Hollis, FNP  albuterol (PROVENTIL) (2.5 MG/3ML) 0.083% nebulizer solution Take 3 mLs (2.5 mg total) by nebulization every 6 (six) hours as needed for shortness of  breath. 12/01/13   Massie MaroonLachina M Hollis, FNP  promethazine (PHENERGAN) 25 MG tablet Take 25 mg by mouth every 6 (six) hours as needed for nausea.    Historical Provider, MD   Physical Exam: Filed Vitals:   05/01/14 1426 05/01/14 1447  BP: 118/91   Pulse: 112 101  Temp: 99 F (37.2 C)   TempSrc: Oral   Resp: 18 18  Weight: 132 lb (59.875 kg)   SpO2: 85% 95%   General: Alert, awake, oriented x3, in no acute distress.  HEENT: [place]/AT PEERL, EOMI +sleral icterus Neck: Trachea midline,  no masses, no thyromegal,y no JVD, no carotid bruit OROPHARYNX:  Moist, No exudate/ erythema/lesions.  Heart: Regular rate and rhythm, without murmurs, rubs, gallops, PMI non-displaced, no heaves or thrills on palpation.  Lungs: Clear to auscultation, no wheezing or rhonchi noted. No increased vocal fremitus resonant to percussion  Abdomen: Soft, nontender, nondistended, positive bowel sounds, no masses no hepatosplenomegaly noted..  Neuro: No focal neurological deficits noted cranial nerves II through XII grossly intact. 5/5 distal strength, nl sensory  Labs on Admission:   Basic Metabolic Panel:  Recent Labs Lab 05/01/14 1445  NA 135*  K 4.1  CL 102  CO2 19  GLUCOSE 110*  BUN 11  CREATININE 0.93  CALCIUM 9.5   Liver Function Tests:  Recent Labs Lab 05/01/14 1445  AST 107*  ALT 32  ALKPHOS 374*  BILITOT 14.6*  PROT 7.9  ALBUMIN 3.1*   CBC:  Recent Labs Lab 05/01/14 1445  WBC 9.3  NEUTROABS 5.0  HGB 7.6*  HCT 20.3*  MCV 100.5*  PLT 167    Assessment/Plan: Principal Problem:   Sickle cell crisis Active Problems:   Other sickle-cell disease with crisis Pt will be treated with initially treated with weight based rapid re-dosing IVF and Toradol . If pain is above 6 then Pt will be transitioned to a weight based (Dilaudid/Morpiine) PCA . Patient will be re-evaluated for pain in the context of function and relationship to baseline as care progresses.  Time spend:30 minutes Code  Status: Full code Family Communication: none Disposition Plan: home if pain improves  Serina CowperAGBOOLA, Cannen Dupras, MD  Pager [phone]  If 7PM-7AM, please contact night-coverage [email or web address] Password [email or web address] 05/01/2014, 4:21 PM

## 2014-05-01 NOTE — Telephone Encounter (Signed)
Pt calls and reports pain in back, nausea, no vomiting; pt denies SOB, CP, fever; pt has no relief with home meds; notified MD; pt told to come to day hospital for evaluation; pt verbalizes understanding

## 2014-05-01 NOTE — Discharge Summary (Signed)
Physician Discharge Summary  Joan Seedsatrice Bennie ZOX:[id number] DOB: 05/17/1986 DOA: 05/01/2014  PCP: MATTHEWS,MICHELLE A., MD  Admit date: 05/01/2014 Discharge date: 05/01/2014  Discharge Diagnoses:  Principal Problem:   Sickle cell crisis Active Problems:   Other sickle-cell disease with crisis   Discharge Condition: improved  Disposition:  Follow-up Information   Follow up with MATTHEWS,MICHELLE A., MD In 1 day. (If symptoms worsen)    Specialty:  Internal Medicine   Contact information:   [address] [place] [address] [phone]       Diet:General  Wt Readings from Last 3 Encounters:  05/01/14 132 lb (59.875 kg)  04/14/14 132 lb (59.875 kg)  04/06/14 132 lb (59.875 kg)    History of present illness:  Joan Martin is a 27yo female with HbSS disease, who presents for pain in her lower back. She states that pain started yesterday. She was taking Percocet around the clock but has found no relief in pain. She denies pain elsewhere and likens pain to her sickle cell pain. She denies any cough, CP, SOB, vomiting, diarrhea, or fever. She rates her pain as a 5-6/10 currently but took Percocet recently. She presents to the day hospital for further pain management.   Hospital Course:  Pt was treated with weight based rapid IVF and Toradol . Pain was managed with Dilaudid injections spaced an hour apart (0.5mg , 0.5mg , and 1 mg respectively). Pain remained lower than a 5/10. Pain prior to discharge was a 2/10. Marland Kitchen.  Discharge Exam:  Filed Vitals:   05/01/14 1734  BP: 128/81  Pulse: 84  Temp: 98.6 F (37 C)  Resp: 18   Filed Vitals:   05/01/14 1426 05/01/14 1447 05/01/14 1734  BP: 118/91  128/81  Pulse: 112 101 84  Temp: 99 F (37.2 C)  98.6 F (37 C)  TempSrc: Oral  Oral  Resp: 18 18 18   Weight: 132 lb (59.875 kg)    SpO2: 85% 95% 91%   General: Alert, awake, oriented x3, in no acute distress.  HEENT: [place]/AT PEERL, EOMI +sleral icterus  Neck: Trachea  midline, no masses, no thyromegal,y no JVD, no carotid bruit  OROPHARYNX: Moist, No exudate/ erythema/lesions.  Heart: Regular rate and rhythm, without murmurs, rubs, gallops, PMI non-displaced, no heaves or thrills on palpation.  Lungs: Clear to auscultation, no wheezing or rhonchi noted. No increased vocal fremitus resonant to percussion  Abdomen: Soft, nontender, nondistended, positive bowel sounds, no masses no hepatosplenomegaly noted..  Neuro: No focal neurological deficits noted cranial nerves II through XII grossly intact. 5/5 distal strength, nl sensory Musculoskeletal: No warm swelling or erythema around joints, no spinal tenderness noted. Psychiatric: Patient alert and oriented x3, good insight and cognition, good recent to remote recall. Lymph node survey: No cervical axillary or inguinal lymphadenopathy noted.   Discharge Instructions     Medication List         albuterol (2.5 MG/3ML) 0.083% nebulizer solution  Commonly known as:  PROVENTIL  Take 3 mLs (2.5 mg total) by nebulization every 6 (six) hours as needed for shortness of breath.     cholecalciferol 1000 UNITS tablet  Commonly known as:  VITAMIN D  Take 1,000 Units by mouth daily.     diphenhydrAMINE 25 MG tablet  Commonly known as:  BENADRYL  Take 25 mg by mouth every 6 (six) hours as needed for itching.     Fluticasone-Salmeterol 250-50 MCG/DOSE Aepb  Commonly known as:  ADVAIR  Inhale 1 puff into the lungs  daily.     folic acid 1 MG tablet  Commonly known as:  FOLVITE  Take 1 tablet (1 mg total) by mouth daily.     hydroxyurea 500 MG capsule  Commonly known as:  HYDREA  TAKE ONE CAPSULE BY MOUTH TWICE DAILY     ibuprofen 600 MG tablet  Commonly known as:  ADVIL,MOTRIN  Take 1 tablet (600 mg total) by mouth every 8 (eight) hours as needed for pain.     mometasone 50 MCG/ACT nasal spray  Commonly known as:  NASONEX  Place 2 sprays into the nose daily.     ondansetron 4 MG tablet  Commonly known  as:  ZOFRAN  Take 1 tablet (4 mg total) by mouth every 6 (six) hours.     oxyCODONE-acetaminophen 10-325 MG per tablet  Commonly known as:  PERCOCET  Take 1 tablet every 4 hours for severe pain     promethazine 25 MG tablet  Commonly known as:  PHENERGAN  Take 25 mg by mouth every 6 (six) hours as needed for nausea.          The results of significant diagnostics from this hospitalization (including imaging, microbiology, ancillary and laboratory) are listed below for reference.    Significant Diagnostic Studies: No results found.  Microbiology: No results found for this or any previous visit (from the past 240 hour(s)).   Labs: Basic Metabolic Panel:  Recent Labs Lab 05/01/14 1445  NA 135*  K 4.1  CL 102  CO2 19  GLUCOSE 110*  BUN 11  CREATININE 0.93  CALCIUM 9.5   Liver Function Tests:  Recent Labs Lab 05/01/14 1445  AST 107*  ALT 32  ALKPHOS 374*  BILITOT 14.6*  PROT 7.9  ALBUMIN 3.1*   No results found for this basename: LIPASE, AMYLASE,  in the last 168 hours No results found for this basename: AMMONIA,  in the last 168 hours CBC:  Recent Labs Lab 05/01/14 1445  WBC 9.3  NEUTROABS 5.0  HGB 7.6*  HCT 20.3*  MCV 100.5*  PLT 167    Time coordinating discharge: 30 minutes  Signed:  Serina Cowper  05/01/2014, 6:34 PM

## 2014-05-17 ENCOUNTER — Telehealth: Payer: Self-pay | Admitting: Internal Medicine

## 2014-05-17 ENCOUNTER — Other Ambulatory Visit: Payer: Self-pay

## 2014-05-17 MED ORDER — ONDANSETRON HCL 4 MG PO TABS: 4.0000 mg | ORAL_TABLET | Freq: Four times a day (QID) | ORAL | Status: DC

## 2014-05-17 NOTE — Telephone Encounter (Signed)
Refilled zofran to the pharmacy via e-script. Thanks!

## 2014-05-19 ENCOUNTER — Other Ambulatory Visit: Payer: Self-pay | Admitting: Internal Medicine

## 2014-05-24 ENCOUNTER — Telehealth (HOSPITAL_COMMUNITY): Payer: Self-pay | Admitting: *Deleted

## 2014-05-24 NOTE — Telephone Encounter (Signed)
Received patient call. Patient c/o continues to have constant headaches daily, also c/o intermittent back pain. Rating headache 7/10 and back pain 5/10. Patient states she has been getting rest, sleep, POs, and pain medication with some relief (pain down to about 3/10) but pain returns. Patient reports she does not feel like this is a sickle cell crisis. Patient would like to discuss concerns with Dr. Ashley RoyaltyMatthews for recommendations. To route phone call to Dr. Deno EtienneM. Matthews MD. Pt aware. Patient to seek medical attention as needed if concerns change or worsen.

## 2014-05-25 ENCOUNTER — Telehealth (HOSPITAL_COMMUNITY): Payer: Self-pay | Admitting: *Deleted

## 2014-05-25 ENCOUNTER — Encounter (HOSPITAL_COMMUNITY): Payer: Self-pay

## 2014-05-25 ENCOUNTER — Non-Acute Institutional Stay (HOSPITAL_COMMUNITY)
Admission: AD | Admit: 2014-05-25 | Discharge: 2014-05-25 | Disposition: A | Discharge status: Home / Self Care | Payer: Medicaid Other | Attending: Internal Medicine | Admitting: Internal Medicine

## 2014-05-25 DIAGNOSIS — Z8673 Personal history of transient ischemic attack (TIA), and cerebral infarction without residual deficits: Secondary | ICD-10-CM | POA: Diagnosis not present

## 2014-05-25 DIAGNOSIS — M545 Low back pain, unspecified: Secondary | ICD-10-CM | POA: Insufficient documentation

## 2014-05-25 DIAGNOSIS — D5700 Hb-SS disease with crisis, unspecified: Secondary | ICD-10-CM | POA: Diagnosis not present

## 2014-05-25 DIAGNOSIS — D571 Sickle-cell disease without crisis: Secondary | ICD-10-CM

## 2014-05-25 DIAGNOSIS — Z79899 Other long term (current) drug therapy: Secondary | ICD-10-CM | POA: Diagnosis not present

## 2014-05-25 DIAGNOSIS — J45909 Unspecified asthma, uncomplicated: Secondary | ICD-10-CM | POA: Insufficient documentation

## 2014-05-25 DIAGNOSIS — Z9089 Acquired absence of other organs: Secondary | ICD-10-CM | POA: Diagnosis not present

## 2014-05-25 LAB — CBC WITH DIFFERENTIAL/PLATELET
BASOS ABS: 0.4 10*3/uL — AB (ref 0.0–0.1)
Basophils Relative: 4 % — ABNORMAL HIGH (ref 0–1)
EOS ABS: 0.3 10*3/uL (ref 0.0–0.7)
EOS PCT: 3 % (ref 0–5)
HEMATOCRIT: 22.2 % — AB (ref 36.0–46.0)
Hemoglobin: 7.9 g/dL — ABNORMAL LOW (ref 12.0–15.0)
LYMPHS ABS: 2.5 10*3/uL (ref 0.7–4.0)
Lymphocytes Relative: 28 % (ref 12–46)
MCH: 36.1 pg — AB (ref 26.0–34.0)
MCHC: 35.6 g/dL (ref 30.0–36.0)
MCV: 101.4 fL — AB (ref 78.0–100.0)
MONOS PCT: 14 % — AB (ref 3–12)
Monocytes Absolute: 1.2 10*3/uL — ABNORMAL HIGH (ref 0.1–1.0)
NEUTROS ABS: 4.4 10*3/uL (ref 1.7–7.7)
NRBC: 12 /100{WBCs} — AB
Neutrophils Relative %: 51 % (ref 43–77)
Platelets: 194 10*3/uL (ref 150–400)
RBC: 2.19 MIL/uL — ABNORMAL LOW (ref 3.87–5.11)
RDW: 23.4 % — AB (ref 11.5–15.5)
WBC: 8.8 10*3/uL (ref 4.0–10.5)

## 2014-05-25 LAB — RETICULOCYTES
RBC.: 2.19 MIL/uL — ABNORMAL LOW (ref 3.87–5.11)
Retic Count, Absolute: 501.5 10*3/uL — ABNORMAL HIGH (ref 19.0–186.0)
Retic Ct Pct: 22.9 % — ABNORMAL HIGH (ref 0.4–3.1)

## 2014-05-25 LAB — COMPREHENSIVE METABOLIC PANEL
ALT: 30 U/L (ref 0–35)
ANION GAP: 15 (ref 5–15)
AST: 109 U/L — ABNORMAL HIGH (ref 0–37)
Albumin: 3.2 g/dL — ABNORMAL LOW (ref 3.5–5.2)
Alkaline Phosphatase: 369 U/L — ABNORMAL HIGH (ref 39–117)
BUN: 7 mg/dL (ref 6–23)
CO2: 19 mEq/L (ref 19–32)
Calcium: 9.2 mg/dL (ref 8.4–10.5)
Chloride: 102 mEq/L (ref 96–112)
Creatinine, Ser: 0.69 mg/dL (ref 0.50–1.10)
GFR calc non Af Amer: >90 mL/min (ref >90)
GLUCOSE: 82 mg/dL (ref 70–99)
POTASSIUM: 3.9 meq/L (ref 3.7–5.3)
Sodium: 136 mEq/L — ABNORMAL LOW (ref 137–147)
Total Bilirubin: 15.6 mg/dL — ABNORMAL HIGH (ref 0.3–1.2)
Total Protein: 8.1 g/dL (ref 6.0–8.3)

## 2014-05-25 MED ORDER — DIPHENHYDRAMINE HCL 25 MG PO CAPS
25.0000 mg | ORAL_CAPSULE | Freq: Once | ORAL | Status: AC
  Administered 2014-05-25: 25 mg via ORAL
  Filled 2014-05-25: qty 1

## 2014-05-25 MED ORDER — FLUTICASONE PROPIONATE 50 MCG/ACT NA SUSP
1.0000 | Freq: Every day | NASAL | Status: DC
  Filled 2014-05-25: qty 16

## 2014-05-25 MED ORDER — IBUPROFEN 200 MG PO TABS: 600.0000 mg | ORAL_TABLET | Freq: Four times a day (QID) | ORAL | Status: DC | PRN

## 2014-05-25 MED ORDER — OXYCODONE-ACETAMINOPHEN 5-325 MG PO TABS: 1.0000 | ORAL_TABLET | ORAL | Status: DC | PRN

## 2014-05-25 MED ORDER — ONDANSETRON HCL 4 MG/2ML IJ SOLN: 4.0000 mg | INTRAMUSCULAR | Status: DC | PRN

## 2014-05-25 MED ORDER — FOLIC ACID 1 MG PO TABS
1.0000 mg | ORAL_TABLET | Freq: Every day | ORAL | Status: DC
  Administered 2014-05-25: 1 mg via ORAL
  Filled 2014-05-25 (×2): qty 1

## 2014-05-25 MED ORDER — ONDANSETRON HCL 4 MG/2ML IJ SOLN
4.0000 mg | Freq: Once | INTRAMUSCULAR | Status: AC
  Administered 2014-05-25: 4 mg via INTRAVENOUS
  Filled 2014-05-25: qty 2

## 2014-05-25 MED ORDER — DIPHENHYDRAMINE HCL 25 MG PO TABS
25.0000 mg | ORAL_TABLET | Freq: Four times a day (QID) | ORAL | Status: DC | PRN
  Filled 2014-05-25: qty 1

## 2014-05-25 MED ORDER — FOLIC ACID 1 MG PO TABS: 1.0000 mg | ORAL_TABLET | Freq: Every day | ORAL | Status: DC

## 2014-05-25 MED ORDER — VITAMIN D3 25 MCG (1000 UNIT) PO TABS
1000.0000 [IU] | ORAL_TABLET | Freq: Every day | ORAL | Status: DC
  Filled 2014-05-25: qty 1

## 2014-05-25 MED ORDER — ONDANSETRON HCL 4 MG PO TABS: 4.0000 mg | ORAL_TABLET | ORAL | Status: DC | PRN

## 2014-05-25 MED ORDER — OXYCODONE-ACETAMINOPHEN 10-325 MG PO TABS: 1.0000 | ORAL_TABLET | ORAL | Status: DC | PRN

## 2014-05-25 MED ORDER — DEXTROSE-NACL 5-0.45 % IV SOLN: INTRAVENOUS | Status: DC

## 2014-05-25 MED ORDER — HYDROMORPHONE HCL PF 2 MG/ML IJ SOLN
0.5000 mg | Freq: Once | INTRAMUSCULAR | Status: AC
  Administered 2014-05-25: 0.5 mg via INTRAVENOUS
  Filled 2014-05-25: qty 1

## 2014-05-25 MED ORDER — HYDROXYUREA 500 MG PO CAPS: 500.0000 mg | ORAL_CAPSULE | Freq: Two times a day (BID) | ORAL | Status: DC

## 2014-05-25 MED ORDER — DEXTROSE-NACL 5-0.45 % IV SOLN
INTRAVENOUS | Status: DC
  Administered 2014-05-25: 14:00:00 via INTRAVENOUS

## 2014-05-25 MED ORDER — MOMETASONE FURO-FORMOTEROL FUM 100-5 MCG/ACT IN AERO
2.0000 | INHALATION_SPRAY | Freq: Two times a day (BID) | RESPIRATORY_TRACT | Status: DC
  Filled 2014-05-25: qty 8.8

## 2014-05-25 MED ORDER — PROMETHAZINE HCL 25 MG PO TABS: 25.0000 mg | ORAL_TABLET | Freq: Four times a day (QID) | ORAL | Status: DC | PRN

## 2014-05-25 MED ORDER — KETOROLAC TROMETHAMINE 30 MG/ML IJ SOLN
30.0000 mg | Freq: Once | INTRAMUSCULAR | Status: AC
  Administered 2014-05-25: 30 mg via INTRAVENOUS
  Filled 2014-05-25: qty 1

## 2014-05-25 MED ORDER — ONDANSETRON HCL 4 MG PO TABS: 4.0000 mg | ORAL_TABLET | Freq: Four times a day (QID) | ORAL | Status: DC

## 2014-05-25 MED ORDER — OXYCODONE HCL 5 MG PO TABS: 5.0000 mg | ORAL_TABLET | ORAL | Status: DC | PRN

## 2014-05-25 MED ORDER — ALBUTEROL SULFATE (2.5 MG/3ML) 0.083% IN NEBU: 2.5000 mg | INHALATION_SOLUTION | Freq: Four times a day (QID) | RESPIRATORY_TRACT | Status: DC | PRN

## 2014-05-25 NOTE — Telephone Encounter (Signed)
Instructed patient to come to day hospital. Patient acknowledges. Patient states she gets off work at 1300 and will be at the day hospital by 1330.

## 2014-05-25 NOTE — Discharge Instructions (Signed)
Sickle Cell Anemia, Adult °Sickle cell anemia is a condition in which red blood cells have an abnormal "sickle" shape. This abnormal shape shortens the cells' life span, which results in a lower than normal concentration of red blood cells in the blood. The sickle shape also causes the cells to clump together and block free blood flow through the blood vessels. As a result, the tissues and organs of the body do not receive enough oxygen. Sickle cell anemia causes organ damage and pain and increases the risk of infection. °CAUSES  °Sickle cell anemia is a genetic disorder. Those who receive two copies of the gene have the condition, and those who receive one copy have the trait. °RISK FACTORS °The sickle cell gene is most common in people whose families originated in [place]. Other areas of the globe where sickle cell trait occurs include the Mediterranean, [place] and [place], the [place], and the [place].  °SIGNS AND SYMPTOMS °· Pain, especially in the extremities, back, chest, or abdomen (common). The pain may start suddenly or may develop following an illness, especially if there is dehydration. Pain can also occur due to overexertion or exposure to extreme temperature changes. °· Frequent severe bacterial infections, especially certain types of pneumonia and meningitis. °· Pain and swelling in the hands and feet. °· Decreased activity.   °· Loss of appetite.   °· Change in behavior. °· Headaches. °· Seizures. °· Shortness of breath or difficulty breathing. °· Vision changes. °· Skin ulcers. °Those with the trait may not have symptoms or they may have mild symptoms.  °DIAGNOSIS  °Sickle cell anemia is diagnosed with blood tests that demonstrate the genetic trait. It is often diagnosed during the newborn period, due to mandatory testing nationwide. A variety of blood tests, X-rays, CT scans, MRI scans, ultrasounds, and lung function tests may also be done to monitor the condition. °TREATMENT  °Sickle  cell anemia may be treated with: °· Medicines. You may be given pain medicines, antibiotic medicines (to treat and prevent infections) or medicines to increase the production of certain types of hemoglobin. °· Fluids. °· Oxygen. °· Blood transfusions. °HOME CARE INSTRUCTIONS  °· Drink enough fluid to keep your urine clear or pale yellow. Increase your fluid intake in hot weather and during exercise. °· Do not smoke. Smoking lowers oxygen levels in the blood.   °· Only take over-the-counter or prescription medicines for pain, fever, or discomfort as directed by your health care provider. °· Take antibiotics as directed by your health care provider. Make sure you finish them it even if you start to feel better.   °· Take supplements as directed by your health care provider.   °· Consider wearing a medical alert bracelet. This tells anyone caring for you in an emergency of your condition.   °· When traveling, keep your medical information, health care provider's names, and the medicines you take with you at all times.   °· If you develop a fever, do not take medicines to reduce the fever right away. This could cover up a problem that is developing. Notify your health care provider. °· Keep all follow-up appointments with your health care provider. Sickle cell anemia requires regular medical care. °SEEK MEDICAL CARE IF: ° You have a fever. °SEEK IMMEDIATE MEDICAL CARE IF:  °· You feel dizzy or faint.   °· You have new abdominal pain, especially on the left side near the stomach area.   °· You develop a persistent, often uncomfortable and painful penile erection (priapism). If this is not treated immediately it   will lead to impotence.   °· You have numbness your arms or legs or you have a hard time moving them.   °· You have a hard time with speech.   °· You have a fever or persistent symptoms for more than 2-3 days.   °· You have a fever and your symptoms suddenly get worse.   °· You have signs or symptoms of infection.  These include:   °¨ Chills.   °¨ Abnormal tiredness (lethargy).   °¨ Irritability.   °¨ Poor eating.   °¨ Vomiting.   °· You develop pain that is not helped with medicine.   °· You develop shortness of breath. °· You have pain in your chest.   °· You are coughing up pus-like or bloody sputum.   °· You develop a stiff neck. °· Your feet or hands swell or have pain. °· Your abdomen appears bloated. °· You develop joint pain. °MAKE SURE YOU: °· Understand these instructions. °Document Released: 01/14/2006 Document Revised: 02/20/2014 Document Reviewed: 05/18/2013 °[employer]® Patient Information ©2015 [employer]. This information is not intended to replace advice given to you by your health care provider. Make sure you discuss any questions you have with your health care provider. ° °

## 2014-05-25 NOTE — Progress Notes (Signed)
Patient discharged home. Patient in no apparent distress. Discharge instructions reviewed. Prescriptions given. Patient to call office to schedule f/u appointment. Patient acknowledges. Patient left day hospital with belongings ambulatory via self.

## 2014-05-25 NOTE — H&P (Signed)
[facility] History and Physical  Joan Martin ZOX:[id number] DOB: Aug 16, 1986 DOA: 05/25/2014   PCP: Joan A., MD   Chief Complaint: Low back pain  HPI: Joan Martin is a 28 year old female with a history of sickle cell disease, HbSS that presents with low back pain. Joan Martin reports that low back pain started 1 day ago. She attributes current pain crisis to increase activity. She describes pain as 7/10 in intensity, throbbing, intermittent and localized. She reports that she last had Percocet 10-325 last night around 9 pm with minimal relief, patient states that she has been applying a heating pad to low back as well. She says that she has consistently taking her maintenance medications. She reports a headache, denies chest pain, shortness of breath, fever, nausea, vomiting, or diarrhea.    Review of Systems:  Constitutional: No weight loss, night sweats, Fevers, chills, fatigue.  HEENT:  Occasional throbbing headache. No dizziness, seizures, vision changes, difficulty swallowing,Tooth/dental problems,Sore throat, No sneezing, itching, ear ache, nasal congestion, post nasal drip. Icterus present.  Cardio-vascular: No chest pain, Orthopnea, PND, swelling in lower extremities, anasarca, dizziness, palpitations  GI: No heartburn, indigestion, abdominal pain, nausea, vomiting, diarrhea, change in bowel habits, loss of appetite  Resp: No shortness of breath with exertion or at rest. No excess mucus, no productive cough, No non-productive cough, No coughing up of blood.No change in color of mucus.No wheezing.No chest wall deformity  Skin: no rash or lesions.  GU: no dysuria, change in color of urine, no urgency or frequency. No flank pain.  Musculoskeletal: Low back pain present. No decreased range of motion or joint swelling.  Psych: No change in mood or affect. No depression or anxiety. No memory loss.    Past Medical History  Diagnosis Date  . Sickle cell anemia   .  Stroke     7 years ago  . Sickle cell anemia   . Asthma    Past Surgical History  Procedure Laterality Date  . Cholecystectomy     Social History:  reports that she has never smoked. She does not have any smokeless tobacco history on file. She reports that she does not drink alcohol or use illicit drugs.  Allergies  Allergen Reactions  . Cephalosporins     Possible family reaction  . Citrus Other (See Comments)    Severity varies/ Reaction varies hives around mouth to swelling around mouth and lips.   . Morphine And Related Hives    Patient states she can take IV dilaudid with benadryl    Family History  Problem Relation Age of Onset  . Diabetes Mother   . Hypertension Mother   . Diabetes Father   . Hypertension Father   . Cancer Maternal Aunt     Prior to Admission medications   Medication Sig Start Date End Date Taking? Authorizing Provider  albuterol (PROVENTIL) (2.5 MG/3ML) 0.083% nebulizer solution Take 3 mLs (2.5 mg total) by nebulization every 6 (six) hours as needed for shortness of breath. 12/01/13  Yes Joan Maroon, FNP  cholecalciferol (VITAMIN D) 1000 UNITS tablet Take 1,000 Units by mouth daily.   Yes Historical Provider, MD  diphenhydrAMINE (BENADRYL) 25 MG tablet Take 25 mg by mouth every 6 (six) hours as needed for itching.   Yes Historical Provider, MD  Fluticasone-Salmeterol (ADVAIR) 250-50 MCG/DOSE AEPB Inhale 1 puff into the lungs daily.   Yes Historical Provider, MD  folic acid (FOLVITE) 1 MG tablet Take 1 tablet (1 mg total)  by mouth daily. 01/03/14  Yes Joan MaroonLachina M Hollis, FNP  hydroxyurea (HYDREA) 500 MG capsule TAKE ONE CAPSULE BY MOUTH TWICE DAILY   Yes Joan HarmMichelle A Matthews, MD  ibuprofen (ADVIL,MOTRIN) 600 MG tablet Take 1 tablet (600 mg total) by mouth every 8 (eight) hours as needed for pain. 07/27/13  Yes Joan SessionsMichelle P Edwards, NP  mometasone (NASONEX) 50 MCG/ACT nasal spray Place 2 sprays into the nose daily.   Yes Historical Provider, MD  ondansetron  (ZOFRAN) 4 MG tablet Take 1 tablet (4 mg total) by mouth every 6 (six) hours. 05/17/14  Yes Joan HarmMichelle A Matthews, MD  oxyCODONE-acetaminophen (PERCOCET) 10-325 MG per tablet Take 1 tablet every 4 hours for severe pain 03/03/14  Yes Joan MaroonLachina M Hollis, FNP  promethazine (PHENERGAN) 25 MG tablet Take 25 mg by mouth every 6 (six) hours as needed for nausea.    Historical Provider, MD   Physical Exam: Filed Vitals:   05/25/14 1348  BP: 136/86  Pulse: 101  Temp: 98.7 F (37.1 C)  TempSrc: Oral  Resp: 18  Weight: 135 lb (61.236 kg)  SpO2: 91%   General: Alert, awake, oriented x3, in no acute distress.  HEENT: [place]/AT PEERL, EOMI. Moderate Scleral icterus present.  Neck: Trachea midline,  no masses, no thyromegal,y no JVD, no carotid bruit OROPHARYNX:  Moist, No exudate/ erythema/lesions.  Heart: Regular rate and rhythm, without murmurs, rubs, gallops, PMI non-displaced, no heaves or thrills on palpation.  Lungs: Clear to auscultation, no wheezing or rhonchi noted. No increased vocal fremitus resonant to percussion  Abdomen: Soft, nontender, nondistended, positive bowel sounds, no masses no hepatosplenomegaly noted..  Neuro: No focal neurological deficits noted cranial nerves II through XII grossly intact. Musculoskeletal: No decreased range of motion. No swelling to low back. Mildly tender to palpation   Labs on Admission:   Basic Metabolic Panel: No results found for this basename: NA, K, CL, CO2, GLUCOSE, BUN, CREATININE, CALCIUM, MG, PHOS,  in the last 168 hours Liver Function Tests: No results found for this basename: AST, ALT, ALKPHOS, BILITOT, PROT, ALBUMIN,  in the last 168 hours CBC: No results found for this basename: WBC, NEUTROABS, HGB, HCT, MCV, PLT,  in the last 168 hours BNP: No components found with this basename: POCBNP,    Radiological Exams on Admission: No results found.  EKG: Independently reviewed. None  Assessment/Plan: Active Problems:   Sickle cell crisis    1. Sickle Cell pain: Patient admitted to day infusion center for extended observation. I will start hypotonic IVF for cellular rehydration. Patient is opiate naive, so I will give Dilaudid 0.5 mg times 1 and reassess pain intensity hourly.  Will give Ms. Chaudhary Zofran 4 mg IV and Benadryl 25 mg po, prior to Dilaudid due to nausea and itching.  Start Toradol 30 mg times 1 for inflammation.   Will check CBC w/diff, CMP and Reticulocyte count.     Time spend: 45 minutes Code Status: Full Family Communication: None Disposition Plan: Home when stable  Joan MaroonHollis,Lachina M, FNP Pager [phone]  If 7PM-7AM, please contact night-coverage [email or web address] Password [email or web address] 05/25/2014, 2:56 PM

## 2014-05-25 NOTE — Telephone Encounter (Signed)
Received patient call. Patient c/o back pain worsen since last night, rating pain 6/10. Reports nausea , able to tolerate fluids. Patient denies all other pertinent ROS per [facility] Triage Assessment. Informed patient that this RN will notify provider on call and patient is to receive a return call back with recommendation. Patient acknowledges.

## 2014-05-25 NOTE — Discharge Summary (Signed)
Physician Discharge Summary  Joan Martin WUJ:[id number] DOB: 24-Nov-1985 DOA: 05/25/2014  PCP: Joan A., MD  Admit date: 05/25/2014 Discharge date: 05/25/2014  Discharge Diagnoses:  Active Problems:   Sickle cell crisis   Discharge Condition: Stable  Disposition:   Diet:Regular Wt Readings from Last 3 Encounters:  05/25/14 135 lb (61.236 kg)  05/01/14 132 lb (59.875 kg)  04/14/14 132 lb (59.875 kg)    Hospital Course:  Patient was admitted to the day infusion center for extended observation. Started on D5.45 for cellular rehydration. Patient appears hydrated and is currently tolerating oral fluids. [name] was administered Dilaudid 0.5 mg and Toradol 30 mg times 1. Pain intensity decreased to 4/10. Repeated Dilaudid 0.5 mg. Pain intensity decreased to 2/10. Patient re-assessed, she appears stable, is alert, oriented, and ambulatory. She states that she can function at home on current medication regimen. Recommend that she increases oral hydration hourly. Re-ordered Rx for Oxycodone 5-325 mg every 4 hours as needed #60.   Patient is to follow up with Dr. Ashley Martin on 05/29/2014 for migraine headaches as discussed.    Discharge Exam:  Filed Vitals:   05/25/14 1730  BP: 115/65  Pulse: 86  Temp: 98.5 F (36.9 C)  Resp: 16   Filed Vitals:   05/25/14 1348 05/25/14 1354 05/25/14 1730  BP: 136/86  115/65  Pulse: 101  86  Temp: 98.7 F (37.1 C)  98.5 F (36.9 C)  TempSrc: Oral  Oral  Resp: 18  16  Weight: 135 lb (61.236 kg)    SpO2: 91% 97% 95%    General: Alert, awake, oriented x3, in no acute distress.  HEENT: [place]/AT PEERL, EOMI, mildly icteric Neck: Trachea midline,  no masses, no thyromegal,y no JVD, no carotid bruit OROPHARYNX:  Moist, No exudate/ erythema/lesions.  Heart: Regular rate and rhythm, without murmurs, rubs, gallops, PMI non-displaced, no heaves or thrills on palpation.  Lungs: Clear to auscultation, no wheezing or rhonchi noted. No increased  vocal fremitus resonant to percussion  Abdomen: Soft, nontender, nondistended, positive bowel sounds, no masses no hepatosplenomegaly noted..  Neuro: No focal neurological deficits noted cranial nerves II through XII grossly intact. DTRs 2+ bilaterally upper and lower extremities. Strength 5 out of 5 in bilateral upper and lower extremities. Musculoskeletal: No warm swelling or erythema around joints, no spinal tenderness noted. Psychiatric: Patient alert and oriented x3, good insight and cognition, good recent to remote recall. Lymph node survey: No cervical axillary or inguinal lymphadenopathy noted.   Discharge Instructions     Medication List    ASK your doctor about these medications       albuterol (2.5 MG/3ML) 0.083% nebulizer solution  Commonly known as:  PROVENTIL  Take 3 mLs (2.5 mg total) by nebulization every 6 (six) hours as needed for shortness of breath.     cholecalciferol 1000 UNITS tablet  Commonly known as:  VITAMIN D  Take 1,000 Units by mouth daily.     diphenhydrAMINE 25 MG tablet  Commonly known as:  BENADRYL  Take 25 mg by mouth every 6 (six) hours as needed for itching.     Fluticasone-Salmeterol 250-50 MCG/DOSE Aepb  Commonly known as:  ADVAIR  Inhale 1 puff into the lungs daily.     folic acid 1 MG tablet  Commonly known as:  FOLVITE  Take 1 tablet (1 mg total) by mouth daily.     hydroxyurea 500 MG capsule  Commonly known as:  HYDREA  TAKE ONE CAPSULE BY MOUTH TWICE DAILY  ibuprofen 600 MG tablet  Commonly known as:  ADVIL,MOTRIN  Take 1 tablet (600 mg total) by mouth every 8 (eight) hours as needed for pain.     mometasone 50 MCG/ACT nasal spray  Commonly known as:  NASONEX  Place 2 sprays into the nose daily.     ondansetron 4 MG tablet  Commonly known as:  ZOFRAN  Take 1 tablet (4 mg total) by mouth every 6 (six) hours.     oxyCODONE-acetaminophen 10-325 MG per tablet  Commonly known as:  PERCOCET  Take 1 tablet every 4 hours for  severe pain     promethazine 25 MG tablet  Commonly known as:  PHENERGAN  Take 25 mg by mouth every 6 (six) hours as needed for nausea.          The results of significant diagnostics from this hospitalization (including imaging, microbiology, ancillary and laboratory) are listed below for reference.    Significant Diagnostic Studies: No results found.  Microbiology: No results found for this or any previous visit (from the past 240 hour(s)).   Labs: Basic Metabolic Panel:  Recent Labs Lab 05/25/14 1348  NA 136*  K 3.9  CL 102  CO2 19  GLUCOSE 82  BUN 7  CREATININE 0.69  CALCIUM 9.2   Liver Function Tests:  Recent Labs Lab 05/25/14 1348  AST 109*  ALT 30  ALKPHOS 369*  BILITOT 15.6*  PROT 8.1  ALBUMIN 3.2*   No results found for this basename: LIPASE, AMYLASE,  in the last 168 hours No results found for this basename: AMMONIA,  in the last 168 hours CBC:  Recent Labs Lab 05/25/14 1348  WBC 8.8  NEUTROABS 4.4  HGB 7.9*  HCT 22.2*  MCV 101.4*  PLT 194   Cardiac Enzymes: No results found for this basename: CKTOTAL, CKMB, CKMBINDEX, TROPONINI,  in the last 168 hours BNP: No components found with this basename: POCBNP,  CBG: No results found for this basename: GLUCAP,  in the last 168 hours Ferritin: No results found for this basename: FERRITIN,  in the last 168 hours  Time coordinating discharge: Greater than 30 minutes  Signed:  Ollin Hochmuth M  05/25/2014, 6:48 PM

## 2014-06-03 NOTE — Discharge Summary (Signed)
Patient was admitted and seen in the day hospital for sickle cell painful crisis. She received doses of IV Dilaudid as well as Toradol and Benadryl. Patient's pain went from 8/10-4/10 at the end of the day. She was subsequently discharged home to continue her home medications and follow up with her primary care physician. I agree with notes as dictated by the nurse practitioner.

## 2014-06-03 NOTE — H&P (Signed)
Patient presented to the day hospital with pain in her legs. She was seen and evaluated. We will admit her for acute care at the day hospital. She received IV Dilaudid and Toradol as needed we'll give her hydration Benadryl and other supportive care. I agree with notes above by the Nurse Practitioner

## 2014-06-06 ENCOUNTER — Telehealth: Payer: Self-pay | Admitting: Internal Medicine

## 2014-06-06 NOTE — Telephone Encounter (Signed)
Joan Martin,  Patient is asking about a MRI, I don't see any orders and patient has not been seen in clinic since 04/06/2014. She was recently in the day hospital, but I still see nothing in the chart. Do you know anything about this? Please advise. Thanks!

## 2014-06-06 NOTE — Telephone Encounter (Signed)
The patient spoke with Dr. Ashley RoyaltyMatthews about being added to her schedule for evaluation during last day hospital admission. Patient will need to be evaluated by a provider prior to assess whether condition warrants an MRI. Please schedule an office visit.

## 2014-06-06 NOTE — Telephone Encounter (Signed)
Patient calling to check the status of the scheduling of an MRI.

## 2014-06-21 ENCOUNTER — Ambulatory Visit: Payer: Medicaid Other | Admitting: Internal Medicine

## 2014-06-21 VITALS — BP 129/86 | HR 94 | Temp 98.2°F | Resp 14 | Ht 67.0 in | Wt 133.0 lb

## 2014-06-21 DIAGNOSIS — G43909 Migraine, unspecified, not intractable, without status migrainosus: Secondary | ICD-10-CM

## 2014-06-21 DIAGNOSIS — Z23 Encounter for immunization: Secondary | ICD-10-CM

## 2014-06-21 DIAGNOSIS — D571 Sickle-cell disease without crisis: Secondary | ICD-10-CM

## 2014-06-21 MED ORDER — SUMATRIPTAN SUCCINATE 25 MG PO TABS: 25.0000 mg | ORAL_TABLET | Freq: Once | ORAL | Status: DC

## 2014-06-21 NOTE — Progress Notes (Unsigned)
Patient ID: Joan Martin, female   DOB: 1986/04/04, 28 y.o.   MRN: [id number]   Joan Martin, is a 28 y.o. female  WUJ:[id number]  NFA:[id number]  DOB - March 21, 1986  CC:  Chief Complaint  Patient presents with  . Headache       HPI: Joan Martin is a 28 y.o. female here today to establish medical care. Patient has No headache, No chest pain, No abdominal pain - No Nausea, No new weakness tingling or numbness, No Cough - SOB.  Allergies  Allergen Reactions  . Cephalosporins     Possible family reaction  . Citrus Other (See Comments)    Severity varies/ Reaction varies hives around mouth to swelling around mouth and lips.   . Morphine And Related Hives    Patient states she can take IV dilaudid with benadryl   Past Medical History  Diagnosis Date  . Sickle cell anemia   . Stroke     7 years ago  . Sickle cell anemia   . Asthma    Current Outpatient Prescriptions on File Prior to Visit  Medication Sig Dispense Refill  . albuterol (PROVENTIL) (2.5 MG/3ML) 0.083% nebulizer solution Take 3 mLs (2.5 mg total) by nebulization every 6 (six) hours as needed for shortness of breath.  75 mL  12  . cholecalciferol (VITAMIN D) 1000 UNITS tablet Take 1,000 Units by mouth daily.      . diphenhydrAMINE (BENADRYL) 25 MG tablet Take 25 mg by mouth every 6 (six) hours as needed for itching.      . Fluticasone-Salmeterol (ADVAIR) 250-50 MCG/DOSE AEPB Inhale 1 puff into the lungs daily.      . folic acid (FOLVITE) 1 MG tablet Take 1 tablet (1 mg total) by mouth daily.  30 tablet  11  . hydroxyurea (HYDREA) 500 MG capsule TAKE ONE CAPSULE BY MOUTH TWICE DAILY  180 capsule  0  . ibuprofen (ADVIL,MOTRIN) 600 MG tablet Take 1 tablet (600 mg total) by mouth every 8 (eight) hours as needed for pain.  30 tablet  0  . mometasone (NASONEX) 50 MCG/ACT nasal spray Place 2 sprays into the nose daily.      . ondansetron (ZOFRAN) 4 MG tablet Take 1 tablet (4 mg total) by mouth every 6 (six) hours.  12  tablet  0  . oxyCODONE-acetaminophen (PERCOCET) 10-325 MG per tablet Take 1 tablet every 4 hours for severe pain  60 tablet  0  . oxyCODONE-acetaminophen (PERCOCET/ROXICET) 5-325 MG per tablet Take 1 tablet by mouth every 4 (four) hours as needed for severe pain.  60 tablet  0  . promethazine (PHENERGAN) 25 MG tablet Take 25 mg by mouth every 6 (six) hours as needed for nausea.       Current Facility-Administered Medications on File Prior to Visit  Medication Dose Route Frequency Provider Last Rate Last Dose  . ondansetron (ZOFRAN) injection 4 mg  4 mg Intravenous Once Grayce Sessions, NP       Family History  Problem Relation Age of Onset  . Diabetes Mother   . Hypertension Mother   . Diabetes Father   . Hypertension Father   . Cancer Maternal Aunt    History   Social History  . Marital Status: Single    Spouse Name: N/A    Number of Children: N/A  . Years of Education: N/A   Occupational History  . Not on file.   Social History Main Topics  . Smoking status: Never Smoker   .  Smokeless tobacco: Not on file  . Alcohol Use: No  . Drug Use: No  . Sexual Activity: Yes    Birth Control/ Protection: Condom   Other Topics Concern  . Not on file   Social History Narrative  . No narrative on file    Review of Systems: Constitutional: Negative for fever, chills, diaphoresis, activity change, appetite change and fatigue. HENT: Negative for ear pain, nosebleeds, congestion, facial swelling, rhinorrhea, neck pain, neck stiffness and ear discharge.  Eyes: Negative for pain, discharge, redness, itching and visual disturbance. Respiratory: Negative for cough, choking, chest tightness, shortness of breath, wheezing and stridor.  Cardiovascular: Negative for chest pain, palpitations and leg swelling. Gastrointestinal: Negative for abdominal distention. Genitourinary: Negative for dysuria, urgency, frequency, hematuria, flank pain, decreased urine volume, difficulty urinating and  dyspareunia.  Musculoskeletal: Negative for back pain, joint swelling, arthralgia and gait problem. Neurological: Negative for dizziness, tremors, seizures, syncope, facial asymmetry, speech difficulty, weakness, light-headedness, numbness and headaches.  Hematological: Negative for adenopathy. Does not bruise/bleed easily. Psychiatric/Behavioral: Negative for hallucinations, behavioral problems, confusion, dysphoric mood, decreased concentration and agitation.    Objective:         Filed Vitals:   06/21/14 0848  BP: 129/86  Pulse: 94  Temp: 98.2 F (36.8 C)  Resp: 14    Physical Exam: Constitutional: Patient appears well-developed and well-nourished. No distress. HENT: Normocephalic, atraumatic, External right and left ear normal. Oropharynx is clear and moist.  Eyes: Conjunctivae and EOM are normal. PERRLA, no scleral icterus. Neck: Normal ROM. Neck supple. No JVD. No tracheal deviation. No thyromegaly. CVS: RRR, S1/S2 +, no murmurs, no gallops, no carotid bruit.  Pulmonary: Effort and breath sounds normal, no stridor, rhonchi, wheezes, rales.  Abdominal: Soft. BS +, no distension, tenderness, rebound or guarding.  Musculoskeletal: Normal range of motion. No edema and no tenderness.  Lymphadenopathy: No lymphadenopathy noted, cervical, inguinal or axillary Neuro: Alert. Normal reflexes, muscle tone coordination. No cranial nerve deficit. Skin: Skin is warm and dry. No rash noted. Not diaphoretic. No erythema. No pallor. Psychiatric: Normal mood and affect. Behavior, judgment, thought content normal.  Lab Results  Component Value Date   WBC 8.8 05/25/2014   HGB 7.9* 05/25/2014   HCT 22.2* 05/25/2014   MCV 101.4* 05/25/2014   PLT 194 05/25/2014   Lab Results  Component Value Date   CREATININE 0.69 05/25/2014   BUN 7 05/25/2014   NA 136* 05/25/2014   K 3.9 05/25/2014   CL 102 05/25/2014   CO2 19 05/25/2014    No results found for this basename: HGBA1C   Lipid Panel  No results  found for this basename: chol, trig, hdl, cholhdl, vldl, ldlcalc       Assessment and plan:   1. Migraine, unspecified, without mention of intractable migraine without mention of status migrainosus *** - SUMAtriptan (IMITREX) 25 MG tablet; Take 1 tablet (25 mg total) by mouth once. May repeat in 2 hours if headache persists or recurs. Not to exceed 2 doses in 24 hours  Dispense: 10 tablet; Refill: 0  2. Immunization due *** - Flu Vaccine QUAD 36+ mos PF IM (Fluarix Quad PF)  3. Hb-SS disease without crisis *** - Ambulatory referral to Hematology   No Follow-up on file.  The patient was given clear instructions to go to ER or return to medical center if symptoms don't improve, worsen or new problems develop. The patient verbalized understanding. The patient was told to call to get lab results if they haven't  heard anything in the next week.     This note has been created with Education officer, environmental. Any transcriptional errors are unintentional.    Donzella Carrol A., MD [facility] [place], [place] [phone]   06/21/2014, 9:33 AM

## 2014-07-11 ENCOUNTER — Non-Acute Institutional Stay (HOSPITAL_COMMUNITY)
Admission: AD | Admit: 2014-07-11 | Discharge: 2014-07-11 | Disposition: A | Discharge status: Home / Self Care | Payer: Medicaid Other | Attending: Internal Medicine | Admitting: Internal Medicine

## 2014-07-11 ENCOUNTER — Encounter (HOSPITAL_COMMUNITY): Payer: Self-pay | Admitting: Hematology

## 2014-07-11 ENCOUNTER — Telehealth (HOSPITAL_COMMUNITY): Payer: Self-pay | Admitting: *Deleted

## 2014-07-11 DIAGNOSIS — Z888 Allergy status to other drugs, medicaments and biological substances status: Secondary | ICD-10-CM | POA: Insufficient documentation

## 2014-07-11 DIAGNOSIS — D5700 Hb-SS disease with crisis, unspecified: Secondary | ICD-10-CM | POA: Insufficient documentation

## 2014-07-11 DIAGNOSIS — Z8673 Personal history of transient ischemic attack (TIA), and cerebral infarction without residual deficits: Secondary | ICD-10-CM | POA: Diagnosis not present

## 2014-07-11 DIAGNOSIS — Z91018 Allergy to other foods: Secondary | ICD-10-CM | POA: Insufficient documentation

## 2014-07-11 DIAGNOSIS — E876 Hypokalemia: Secondary | ICD-10-CM

## 2014-07-11 DIAGNOSIS — Z9089 Acquired absence of other organs: Secondary | ICD-10-CM | POA: Insufficient documentation

## 2014-07-11 DIAGNOSIS — J45909 Unspecified asthma, uncomplicated: Secondary | ICD-10-CM | POA: Diagnosis not present

## 2014-07-11 DIAGNOSIS — Z885 Allergy status to narcotic agent status: Secondary | ICD-10-CM | POA: Diagnosis not present

## 2014-07-11 DIAGNOSIS — D571 Sickle-cell disease without crisis: Secondary | ICD-10-CM

## 2014-07-11 DIAGNOSIS — E860 Dehydration: Secondary | ICD-10-CM

## 2014-07-11 LAB — CBC WITH DIFFERENTIAL/PLATELET
BASOS PCT: 4 % — AB (ref 0–1)
Basophils Absolute: 0.3 10*3/uL — ABNORMAL HIGH (ref 0.0–0.1)
EOS PCT: 2 % (ref 0–5)
Eosinophils Absolute: 0.2 10*3/uL (ref 0.0–0.7)
HEMATOCRIT: 19.8 % — AB (ref 36.0–46.0)
HEMOGLOBIN: 7.5 g/dL — AB (ref 12.0–15.0)
Lymphocytes Relative: 33 % (ref 12–46)
Lymphs Abs: 2.5 10*3/uL (ref 0.7–4.0)
MCH: 42.1 pg — ABNORMAL HIGH (ref 26.0–34.0)
MCHC: 37.6 g/dL — ABNORMAL HIGH (ref 30.0–36.0)
MCV: 111.2 fL — ABNORMAL HIGH (ref 78.0–100.0)
MONO ABS: 0.8 10*3/uL (ref 0.1–1.0)
Monocytes Relative: 11 % (ref 3–12)
NRBC: 54 /100{WBCs} — AB
Neutro Abs: 3.8 10*3/uL (ref 1.7–7.7)
Neutrophils Relative %: 50 % (ref 43–77)
Platelets: 188 10*3/uL (ref 150–400)
RBC: 1.8 MIL/uL — AB (ref 3.87–5.11)
RDW: 24.8 % — ABNORMAL HIGH (ref 11.5–15.5)
WBC: 7.6 10*3/uL (ref 4.0–10.5)

## 2014-07-11 LAB — COMPREHENSIVE METABOLIC PANEL
ALBUMIN: 3 g/dL — AB (ref 3.5–5.2)
ALK PHOS: 330 U/L — AB (ref 39–117)
ALT: 48 U/L — AB (ref 0–35)
ANION GAP: 13 (ref 5–15)
AST: 99 U/L — ABNORMAL HIGH (ref 0–37)
BILIRUBIN TOTAL: 12.2 mg/dL — AB (ref 0.3–1.2)
BUN: 8 mg/dL (ref 6–23)
CHLORIDE: 107 meq/L (ref 96–112)
CO2: 19 mEq/L (ref 19–32)
Calcium: 8.9 mg/dL (ref 8.4–10.5)
Creatinine, Ser: 0.74 mg/dL (ref 0.50–1.10)
GFR calc Af Amer: >90 mL/min (ref >90)
GFR calc non Af Amer: >90 mL/min (ref >90)
Glucose, Bld: 117 mg/dL — ABNORMAL HIGH (ref 70–99)
POTASSIUM: 3.4 meq/L — AB (ref 3.7–5.3)
SODIUM: 139 meq/L (ref 137–147)
TOTAL PROTEIN: 7.6 g/dL (ref 6.0–8.3)

## 2014-07-11 LAB — LACTATE DEHYDROGENASE: LDH: 844 U/L — AB (ref 94–250)

## 2014-07-11 LAB — RETICULOCYTES
RBC.: 1.8 MIL/uL — ABNORMAL LOW (ref 3.87–5.11)
RETIC COUNT ABSOLUTE: 390.6 10*3/uL — AB (ref 19.0–186.0)
Retic Ct Pct: 21.7 % — ABNORMAL HIGH (ref 0.4–3.1)

## 2014-07-11 MED ORDER — OXYCODONE-ACETAMINOPHEN 10-325 MG PO TABS: ORAL_TABLET | ORAL | Status: DC

## 2014-07-11 MED ORDER — OXYCODONE HCL 5 MG PO TABS
5.0000 mg | ORAL_TABLET | Freq: Once | ORAL | Status: DC
  Filled 2014-07-11: qty 1

## 2014-07-11 MED ORDER — HYDROMORPHONE 2 MG/ML HIGH CONCENTRATION IV PCA SOLN: INTRAVENOUS | Status: DC

## 2014-07-11 MED ORDER — OXYCODONE-ACETAMINOPHEN 5-325 MG PO TABS
1.0000 | ORAL_TABLET | Freq: Once | ORAL | Status: DC
  Filled 2014-07-11: qty 1

## 2014-07-11 MED ORDER — HYDROMORPHONE HCL 2 MG/ML IJ SOLN
1.8000 mg | Freq: Once | INTRAMUSCULAR | Status: AC
  Administered 2014-07-11: 1.8 mg via INTRAVENOUS
  Filled 2014-07-11: qty 1

## 2014-07-11 MED ORDER — DIPHENHYDRAMINE HCL 25 MG PO CAPS
25.0000 mg | ORAL_CAPSULE | Freq: Four times a day (QID) | ORAL | Status: DC | PRN
  Administered 2014-07-11 (×2): 25 mg via ORAL
  Filled 2014-07-11 (×2): qty 1

## 2014-07-11 MED ORDER — HYDROMORPHONE HCL 2 MG/ML IJ SOLN
1.5000 mg | Freq: Once | INTRAMUSCULAR | Status: AC
  Administered 2014-07-11: 1.5 mg via INTRAVENOUS
  Filled 2014-07-11: qty 1

## 2014-07-11 MED ORDER — DEXTROSE-NACL 5-0.45 % IV SOLN
INTRAVENOUS | Status: DC
  Administered 2014-07-11: 10:00:00 via INTRAVENOUS

## 2014-07-11 MED ORDER — NALOXONE HCL 0.4 MG/ML IJ SOLN: 0.4000 mg | INTRAMUSCULAR | Status: DC | PRN

## 2014-07-11 MED ORDER — SODIUM CHLORIDE 0.9 % IJ SOLN: 9.0000 mL | INTRAMUSCULAR | Status: DC | PRN

## 2014-07-11 MED ORDER — KETOROLAC TROMETHAMINE 30 MG/ML IJ SOLN
30.0000 mg | Freq: Once | INTRAMUSCULAR | Status: AC
  Administered 2014-07-11: 30 mg via INTRAVENOUS
  Filled 2014-07-11: qty 1

## 2014-07-11 MED ORDER — ONDANSETRON HCL 4 MG/2ML IJ SOLN
4.0000 mg | Freq: Four times a day (QID) | INTRAMUSCULAR | Status: DC | PRN
  Administered 2014-07-11: 4 mg via INTRAVENOUS
  Filled 2014-07-11: qty 2

## 2014-07-11 MED ORDER — HYDROMORPHONE HCL 2 MG/ML IJ SOLN
2.0000 mg | Freq: Once | INTRAMUSCULAR | Status: AC
  Administered 2014-07-11: 2 mg via INTRAVENOUS
  Filled 2014-07-11: qty 1

## 2014-07-11 NOTE — Discharge Summary (Signed)
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 She describes her pain as typical of pain of Sickle Cell Crisis- throbbing and sharp. Itis non-radiating and currently at an intensity of 10/10.    Hospital Course:  Pt was treated with initially with IVF and Toradol as she is resistant to the use of Opiates as wanted to assess her response top IVF and Toradol. Pain was above 7/10 after initial treatment so weight based rapid re-dosing was initiated.Pt reported pain was down to 0/10 after 3rd dose of dilaudid and felt that she could manage pain at home with oral medication. She was observed for3 hour after last dose of IV Dilaudid and her pain was still well controlled.  Pt was also noted to have a mild hypokalemia and advised to eat and drink foods rich in potassium to correct. Pt is discharged home in good condition.   Discharge Exam:  Filed Vitals:   07/11/14 1315  BP: 120/64  Pulse: 79  Temp:   Resp: 16   Filed Vitals:   07/11/14 0950 07/11/14 1235 07/11/14 1315  BP: 135/76  120/64  Pulse: 94  79  Temp: 98.6 F (37 C)    TempSrc: Oral    Resp: SpO2: 94%  92%    General: Alert, awake, oriented x3, in no acute distress.  HEENT: [place]/AT PEERL, EOMI. Mild icterus Neck: Trachea midline, no masses, no thyromegal,y no JVD, no carotid bruit  OROPHARYNX: Moist, No exudate/ erythema/lesions.  Heart: Regular rate  and rhythm, without murmurs, rubs, gallops, PMI non-displaced, no heaves or thrills on palpation.  Lungs: Clear to auscultation, no wheezing or rhonchi noted. No increased vocal fremitus resonant to percussion  Abdomen: Soft, nontender, nondistended, positive bowel sounds, no masses no hepatosplenomegaly noted..  Neuro: No focal neurological deficits noted cranial nerves II through XII grossly intact.  Discharge Instructions  Discharge Instructions   Activity as tolerated - No restrictions    Complete by:  As directed      Diet general    Complete by:  As directed             Medication List         albuterol (2.5 MG/3ML) 0.083% nebulizer solution  Commonly known as:  PROVENTIL  Take 3 mLs (2.5 mg total) by nebulization every 6 (six) hours as needed for shortness of breath.     cholecalciferol 1000 UNITS tablet  Commonly known as:  VITAMIN D  Take 1,000 Units by mouth daily.     diphenhydrAMINE 25 MG tablet  Commonly known as:  BENADRYL  Take 25 mg by mouth every 6 (six) hours as needed for itching.     Fluticasone-Salmeterol 250-50 MCG/DOSE Aepb  Commonly known as:  ADVAIR  Inhale 1 puff into the lungs daily.     folic acid 1 MG tablet  Commonly known as:  [employer]  Take 1 tablet (1 mg total) by mouth daily.     hydroxyurea 500 MG capsule  Commonly known as:  HYDREA  TAKE ONE CAPSULE BY MOUTH TWICE DAILY     ibuprofen 600 MG tablet  Commonly known as:  ADVIL,MOTRIN  Take 1 tablet (600 mg total) by mouth every 8 (eight) hours as needed for pain.     mometasone 50 MCG/ACT nasal spray  Commonly known as:  NASONEX  Place 2 sprays into the nose daily.     ondansetron 4 MG tablet  Commonly known as:  ZOFRAN  Take 1 tablet (4 mg total) by mouth every 6 (six) hours.     oxyCODONE-acetaminophen 10-325 MG per tablet  Commonly known as:  PERCOCET  Take 1 tablet every 4 hours for severe pain     promethazine 25 MG tablet  Commonly known as:  PHENERGAN  Take 25 mg by  mouth every 6 (six) hours as needed for nausea.     SUMAtriptan 25 MG tablet  Commonly known as:  IMITREX  Take 1 tablet (25 mg total) by mouth once. May repeat in 2 hours if headache persists or recurs. Not to exceed 2 doses in 24 hours          The results of significant diagnostics from this hospitalization (including imaging, microbiology, ancillary and laboratory) are listed below for reference.    Significant Diagnostic Studies: No results found.  Microbiology: No results found for this or any previous visit (from the past 240 hour(s)).   Labs: Basic Metabolic Panel:  Recent Labs Lab 07/11/14 0950  NA 139  K 3.4*  CL 107  CO2 19  GLUCOSE 117*  BUN 8  CREATININE 0.74  CALCIUM 8.9   Liver Function Tests:  Recent Labs Lab 07/11/14 0950  AST 99*  ALT 48*  ALKPHOS 330*  BILITOT 12.2*  PROT 7.6  ALBUMIN 3.0*   No results found for this basename: LIPASE, AMYLASE,  in the last 168 hours No results found for this basename: AMMONIA,  in the last 168 hours CBC:  Recent Labs Lab 07/11/14 0950  WBC 7.6  NEUTROABS 3.8  HGB 7.5*  HCT 19.8*  MCV 111.2*  PLT 188   Time coordinating discharge: 35 minutes  Signed:  Geoffrey Mankin A.  07/11/2014, 3:02 PM

## 2014-07-11 NOTE — Progress Notes (Signed)
MD notified patient admitted to acute room 2, orders received and initiated.

## 2014-07-11 NOTE — H&P (Signed)
[facility] History and Physical  Joan Martin ZOX:[id number] DOB: 1986-05-11 DOA: 07/11/2014   PCP: MATTHEWS,MICHELLE A., MD   Chief Complaint: Pain to back and legs x 3 days  HPI:Pt with Hb Ss who manages her pain very well at home presents today with c/o of 3 days of pain that became unmanageable in the last 24 hours. She describes her pain as typical of pain of Sickle Cell Crisis- throbbing and sharp. Itis non-radiating and currently at an intensity of 10/10.     Review of Systems:  Constitutional: No weight loss, night sweats, Fevers, chills, fatigue.  HEENT: No headaches, dizziness, seizures, vision changes, difficulty swallowing,Tooth/dental problems,Sore throat, No sneezing, itching, ear ache, nasal congestion, post nasal drip,  Cardio-vascular: No chest pain, Orthopnea, PND, swelling in lower extremities, anasarca, dizziness, palpitations  GI: No heartburn, indigestion, abdominal pain, nausea, vomiting, diarrhea, change in bowel habits, loss of appetite  Resp: No shortness of breath with exertion or at rest. No excess mucus, no productive cough, No non-productive cough, No coughing up of blood.No change in color of mucus.No wheezing.No chest wall deformity  Skin: no rash or lesions.  GU: no dysuria, change in color of urine, no urgency or frequency. No flank pain.  Psych: No change in mood or affect. No depression or anxiety. No memory loss.    Past Medical History  Diagnosis Date  . Sickle cell anemia   . Stroke     7 years ago  . Sickle cell anemia   . Asthma    Past Surgical History  Procedure Laterality Date  . Cholecystectomy     Social History:  reports that she has never smoked. She does not have any smokeless tobacco history on file. She reports that she does not drink alcohol or use illicit drugs.  Allergies  Allergen Reactions  . Cephalosporins     Possible family reaction  . Citrus Other (See Comments)    Severity varies/ Reaction varies  hives around mouth to swelling around mouth and lips.   . Morphine And Related Hives    Patient states she can take IV dilaudid with benadryl    Family History  Problem Relation Age of Onset  . Diabetes Mother   . Hypertension Mother   . Diabetes Father   . Hypertension Father   . Cancer Maternal Aunt     Prior to Admission medications   Medication Sig Start Date End Date Taking? Authorizing Provider  cholecalciferol (VITAMIN D) 1000 UNITS tablet Take 1,000 Units by mouth daily.   Yes Historical Provider, MD  diphenhydrAMINE (BENADRYL) 25 MG tablet Take 25 mg by mouth every 6 (six) hours as needed for itching.   Yes Historical Provider, MD  Fluticasone-Salmeterol (ADVAIR) 250-50 MCG/DOSE AEPB Inhale 1 puff into the lungs daily.   Yes Historical Provider, MD  folic acid (FOLVITE) 1 MG tablet Take 1 tablet (1 mg total) by mouth daily. 01/03/14  Yes Massie Maroon, FNP  hydroxyurea (HYDREA) 500 MG capsule TAKE ONE CAPSULE BY MOUTH TWICE DAILY   Yes Altha Harm, MD  ibuprofen (ADVIL,MOTRIN) 600 MG tablet Take 1 tablet (600 mg total) by mouth every 8 (eight) hours as needed for pain. 07/27/13  Yes Grayce Sessions, NP  mometasone (NASONEX) 50 MCG/ACT nasal spray Place 2 sprays into the nose daily.   Yes Historical Provider, MD  ondansetron (ZOFRAN) 4 MG tablet Take 1 tablet (4 mg total) by mouth every 6 (six) hours. 05/17/14  Yes Marcelino Duster A  Ashley Royalty, MD  SUMAtriptan (IMITREX) 25 MG tablet Take 1 tablet (25 mg total) by mouth once. May repeat in 2 hours if headache persists or recurs. Not to exceed 2 doses in 24 hours 06/21/14  Yes Altha Harm, MD  albuterol (PROVENTIL) (2.5 MG/3ML) 0.083% nebulizer solution Take 3 mLs (2.5 mg total) by nebulization every 6 (six) hours as needed for shortness of breath. 12/01/13   Massie Maroon, FNP  oxyCODONE-acetaminophen (PERCOCET) 10-325 MG per tablet Take 1 tablet every 4 hours for severe pain 03/03/14   Massie Maroon, FNP  promethazine  (PHENERGAN) 25 MG tablet Take 25 mg by mouth every 6 (six) hours as needed for nausea.    Historical Provider, MD   Physical Exam: Filed Vitals:   07/11/14 0950  BP: 135/76  Pulse: 94  Temp: 98.6 F (37 C)  TempSrc: Oral  Resp: 16  SpO2: 94%   General: Alert, awake, oriented x3, in no acute distress.  HEENT: [place]/AT PEERL, EOMI Neck: Trachea midline,  no masses, no thyromegal,y no JVD, no carotid bruit OROPHARYNX:  Moist, No exudate/ erythema/lesions.  Heart: Regular rate and rhythm, without murmurs, rubs, gallops, PMI non-displaced, no heaves or thrills on palpation.  Lungs: Clear to auscultation, no wheezing or rhonchi noted. No increased vocal fremitus resonant to percussion  Abdomen: Soft, nontender, nondistended, positive bowel sounds, no masses no hepatosplenomegaly noted..  Neuro: No focal neurological deficits noted cranial nerves II through XII grossly intact.   Labs on Admission:   Basic Metabolic Panel: No results found for this basename: NA, K, CL, CO2, GLUCOSE, BUN, CREATININE, CALCIUM, MG, PHOS,  in the last 168 hours Liver Function Tests: No results found for this basename: AST, ALT, ALKPHOS, BILITOT, PROT, ALBUMIN,  in the last 168 hours CBC: No results found for this basename: WBC, NEUTROABS, HGB, HCT, MCV, PLT,  in the last 168 hours BNP: No components found with this basename: POCBNP,      Assessment/Plan: Active Problems: Hb SS with crisis: Pt was treated with initially  with  IVF and Toradol as she is resistant to the use of Opiates as wanted to assess her response top IVF and Toradol. Pain is currently above 7/10 so will initiate weight based rapid re-dosing then Pt will be transitioned to a weight based Dilaudid PCA if pain still >7/10. Patient will be re-evaluated for pain in the context of function and relationship to baseline as care progresses.    Time spend: 30 minutes Code Status: [place] Code Family Communication: N/A Disposition Plan:  Not yet  determined  MATTHEWS,MICHELLE A., MD  Pager [phone]  If 7PM-7AM, please contact night-coverage [email or web address] Password [facility] 07/11/2014, 10:26 AM

## 2014-07-11 NOTE — Progress Notes (Addendum)
Patient ID: Joan Martin, female   DOB: 1986-08-29, 28 y.o.   MRN: [id number]  Pt discharged home with friend. Pt a&ox3, pt in no distress. PIV discontinued, dry gauze and bandage applied. Pt received all belongings and prescription. Pt verbalized understanding of all discharge instructions. Pt denies pain at this time, pt denies any further needs at this time.

## 2014-07-11 NOTE — Telephone Encounter (Signed)
Received patient call. Patient c/o lower back pain since last night; rating pain 6/10 post taking percocet and ibuprofen. Patient reports some nausea and able to tolerate POs. Patient denies all other pertinent ROS per [facility] Triage Assessment. Instructed patient to come to day hospital. Patient acknowledges.

## 2014-07-13 ENCOUNTER — Other Ambulatory Visit: Payer: Self-pay | Admitting: Internal Medicine

## 2014-07-25 ENCOUNTER — Telehealth: Payer: Self-pay | Admitting: Internal Medicine

## 2014-07-25 NOTE — Telephone Encounter (Signed)
Patient called stating her insurance no longer covers folic acid and she would not be taking it for a few months.

## 2014-07-26 NOTE — Telephone Encounter (Signed)
fyi

## 2014-07-27 NOTE — Telephone Encounter (Signed)
Called and left voicemail advising patient that Dr. Ashley RoyaltyMatthews advises her to take 1600mcg of folic acid otc since her rx is no longer covered. Left our Number if she has any questions to call us.  Thanks!

## 2014-07-31 ENCOUNTER — Encounter (HOSPITAL_COMMUNITY): Payer: Self-pay | Admitting: Hematology

## 2014-07-31 ENCOUNTER — Ambulatory Visit (INDEPENDENT_AMBULATORY_CARE_PROVIDER_SITE_OTHER): Payer: Medicaid Other | Admitting: Family Medicine

## 2014-07-31 ENCOUNTER — Encounter: Payer: Self-pay | Admitting: Family Medicine

## 2014-07-31 ENCOUNTER — Non-Acute Institutional Stay (HOSPITAL_COMMUNITY)
Admission: AD | Admit: 2014-07-31 | Discharge: 2014-07-31 | Disposition: A | Discharge status: Home / Self Care | Payer: Medicaid Other | Source: Ambulatory Visit | Attending: Internal Medicine | Admitting: Internal Medicine

## 2014-07-31 VITALS — BP 136/81 | HR 86 | Temp 98.3°F | Resp 16 | Ht 67.5 in | Wt 134.0 lb

## 2014-07-31 DIAGNOSIS — R52 Pain, unspecified: Secondary | ICD-10-CM

## 2014-07-31 DIAGNOSIS — E860 Dehydration: Secondary | ICD-10-CM

## 2014-07-31 DIAGNOSIS — R300 Dysuria: Secondary | ICD-10-CM

## 2014-07-31 DIAGNOSIS — Z79899 Other long term (current) drug therapy: Secondary | ICD-10-CM | POA: Diagnosis not present

## 2014-07-31 DIAGNOSIS — R6889 Other general symptoms and signs: Secondary | ICD-10-CM

## 2014-07-31 DIAGNOSIS — D5780 Other sickle-cell disorders without crisis: Secondary | ICD-10-CM | POA: Diagnosis not present

## 2014-07-31 DIAGNOSIS — R6883 Chills (without fever): Secondary | ICD-10-CM

## 2014-07-31 DIAGNOSIS — D5700 Hb-SS disease with crisis, unspecified: Secondary | ICD-10-CM

## 2014-07-31 DIAGNOSIS — J069 Acute upper respiratory infection, unspecified: Secondary | ICD-10-CM

## 2014-07-31 DIAGNOSIS — R0981 Nasal congestion: Secondary | ICD-10-CM

## 2014-07-31 DIAGNOSIS — D571 Sickle-cell disease without crisis: Secondary | ICD-10-CM

## 2014-07-31 LAB — COMPREHENSIVE METABOLIC PANEL
ALBUMIN: 2.9 g/dL — AB (ref 3.5–5.2)
ALT: 27 U/L (ref 0–35)
AST: 77 U/L — AB (ref 0–37)
Alkaline Phosphatase: 308 U/L — ABNORMAL HIGH (ref 39–117)
Anion gap: 16 — ABNORMAL HIGH (ref 5–15)
BUN: 19 mg/dL (ref 6–23)
CALCIUM: 8.4 mg/dL (ref 8.4–10.5)
CO2: 16 mEq/L — ABNORMAL LOW (ref 19–32)
Chloride: 104 mEq/L (ref 96–112)
Creatinine, Ser: 0.76 mg/dL (ref 0.50–1.10)
GFR calc Af Amer: >90 mL/min (ref >90)
GFR calc non Af Amer: >90 mL/min (ref >90)
Glucose, Bld: 82 mg/dL (ref 70–99)
Potassium: 4.2 mEq/L (ref 3.7–5.3)
SODIUM: 136 meq/L — AB (ref 137–147)
TOTAL PROTEIN: 7.6 g/dL (ref 6.0–8.3)
Total Bilirubin: 11 mg/dL — ABNORMAL HIGH (ref 0.3–1.2)

## 2014-07-31 LAB — CBC WITH DIFFERENTIAL/PLATELET
BASOS PCT: 2 % — AB (ref 0–1)
Basophils Absolute: 0.2 10*3/uL — ABNORMAL HIGH (ref 0.0–0.1)
Eosinophils Absolute: 0.2 10*3/uL (ref 0.0–0.7)
Eosinophils Relative: 2 % (ref 0–5)
HCT: 21.5 % — ABNORMAL LOW (ref 36.0–46.0)
Hemoglobin: 7.8 g/dL — ABNORMAL LOW (ref 12.0–15.0)
Lymphocytes Relative: 22 % (ref 12–46)
Lymphs Abs: 2.1 10*3/uL (ref 0.7–4.0)
MCH: 39.2 pg — AB (ref 26.0–34.0)
MCHC: 36.3 g/dL — ABNORMAL HIGH (ref 30.0–36.0)
MCV: 108 fL — ABNORMAL HIGH (ref 78.0–100.0)
Monocytes Absolute: 0.6 10*3/uL (ref 0.1–1.0)
Monocytes Relative: 6 % (ref 3–12)
NEUTROS ABS: 6.6 10*3/uL (ref 1.7–7.7)
NEUTROS PCT: 68 % (ref 43–77)
PLATELETS: 175 10*3/uL (ref 150–400)
RBC: 1.99 MIL/uL — AB (ref 3.87–5.11)
RDW: 20.6 % — ABNORMAL HIGH (ref 11.5–15.5)
WBC: 9.7 10*3/uL (ref 4.0–10.5)

## 2014-07-31 LAB — POCT INFLUENZA A/B
INFLUENZA A, POC: NEGATIVE
INFLUENZA B, POC: NEGATIVE

## 2014-07-31 MED ORDER — DEXTROSE-NACL 5-0.45 % IV SOLN
INTRAVENOUS | Status: DC
  Administered 2014-07-31: 15:00:00 via INTRAVENOUS

## 2014-07-31 MED ORDER — AZITHROMYCIN 250 MG PO TABS: ORAL_TABLET | ORAL | Status: DC

## 2014-07-31 NOTE — Progress Notes (Signed)
Pt discharged to home; discharge instructions explained, given, and signed with all questions answered; no complications noted

## 2014-07-31 NOTE — Progress Notes (Signed)
Subjective:    Patient ID: Rogers SeedsPatrice Martin, female    DOB: 08/08/1986, 28 y.o.   MRN: [id number]  HPI  Patient with a history of sickle cell anemia, HbSS and asthma states that she woke on Saturday with a scratchy throat, nasal congestion, sinus pressure, coughing, body aches, and chills.  Patient reports that she took Zycam and Ibuprofen with minimal relief. She also states that she has been fatigue and sleeping a great deal over the past 2 days. She maintains that she has not had to use rescue inhaler.   Patient also complains of dysuria She has had symptoms for a few hours. Patient also complains of congestion, rhinitis and sorethroat. Patient denies back pain, headache, stomach ache and vaginal discharge. Patient does not have a history of recurrent UTI or pyelonephritis.   Past Medical History  Diagnosis Date  . Sickle cell anemia   . Stroke     7 years ago  . Sickle cell anemia   . Asthma     Review of Systems  Constitutional: Positive for fever (intermittently) and fatigue.  HENT: Positive for postnasal drip and sinus pressure.        Nasal congestion  Respiratory: Positive for cough.   Cardiovascular: Negative.   Gastrointestinal: Negative.   Endocrine: Negative.   Genitourinary: Positive for dysuria.  Musculoskeletal: Negative.   Skin: Negative.   Allergic/Immunologic: Negative.   Neurological: Negative.   Hematological: Negative.   Psychiatric/Behavioral: Negative.        Objective:   Physical Exam  Constitutional: She is oriented to person, place, and time. She appears well-developed and well-nourished.  HENT:  Head: Normocephalic and atraumatic.  Right Ear: External ear normal.  Left Ear: External ear normal.  Nose: Mucosal edema present. Right sinus exhibits frontal sinus tenderness. Left sinus exhibits frontal sinus tenderness.  Mouth/Throat: Mucous membranes are dry. Posterior oropharyngeal erythema (bilateral tonsilar hypertrophy) present.  Mild erythema  to right nare  Eyes: Conjunctivae are normal. Pupils are equal, round, and reactive to light.  Neck: Normal range of motion. Neck supple.  Cardiovascular: Normal rate, regular rhythm, normal heart sounds and intact distal pulses.   Pulmonary/Chest: Effort normal and breath sounds normal.  Abdominal: Soft. Bowel sounds are normal.  Musculoskeletal: Normal range of motion.  Lymphadenopathy:       Head (right side): No submental, no submandibular, no tonsillar, no preauricular, no posterior auricular and no occipital adenopathy present.       Head (left side): No submental, no submandibular, no tonsillar, no preauricular, no posterior auricular and no occipital adenopathy present.  Neurological: She is alert and oriented to person, place, and time. She has normal reflexes.  Skin: Skin is warm and dry.  Psychiatric: She has a normal mood and affect. Her behavior is normal. Judgment and thought content normal.         BP 136/81  Pulse 86  Temp(Src) 98.3 F (36.8 C) (Oral)  Resp 16  Ht 5' 7.5" (1.715 m)  Wt 134 lb (60.782 kg)  BMI 20.67 kg/m2  LMP 07/08/2014 Assessment & Plan:  1. Dehydration Patient states that she has been hydrating frequently. She feels that her mouth is dry and sticky. Will send to the day infusion center for IV fluids. Will start hypotonic IV fluids for Cellular rehydration, check CBC w/ differential and CMP  2. Upper respiratory infection, acute Recommend that patient increase rest, handwashing and humidify air.  - azithromycin (ZITHROMAX) 250 MG tablet; Day 1 (500 mg); days 2-5 (  250 mg)  Dispense: 6 tablet; Refill: 0  3.  Hb-SS disease without crisis Stable; she states that she is taking folic acid and Hydrea consistently. She reports that she feels dehydrated, mouth is sticky and skin feels dry. She states that she has been attempting to stay hydrated during upper respiratory illness. Will admit for extended observation for IV fluids.   4.  Body  aches Continue Ibuprofen as previously prescribed.  5. Nasal congestion Patient reports increased nasal congestion with lying down. Recommend OTC nasal saline twice daily as needed to relieve congestion.   6. Flu-like symptoms - POCT Influenza A/B   7. Chills Recommend OTC Ibuprofen 400 mg every 6 hours has needed.   8. Dysuria-  Recommend that patient increase water intake to 8-10 glasses of water. Add cranberry juice to fluid intake. Recommend that patient wipes from from front to back and empty bladder completely after sexual intercourse.  - Urinalysis, Complete  Kosisochukwu Burningham M, FNP   RTC: 1 month with Dr. Ashley RoyaltyMatthews

## 2014-07-31 NOTE — Discharge Summary (Deleted)
Physician Discharge Summary  Joan Martin EXB:[id number] DOB: 02/15/1986 DOA: 07/31/2014  PCP: MATTHEWS,MICHELLE A., MD  Admit date: 07/31/2014 Discharge date: 07/31/2014  Discharge Diagnoses:  Active Problems:   Dehydration   Discharge Condition: Stable  Disposition: Home  Diet: Regular Wt Readings from Last 3 Encounters:  07/31/14 134 lb (60.782 kg)  07/31/14 134 lb (60.782 kg)  06/21/14 133 lb (60.328 kg)    Hospital Course:  Patient was admitted for extended observation. Patient did not require pain medication during observation.. She is opiate niave and is not currently complaining of increased pain intensity. Patient received 800 cc' of fluid over extended observation. Patient did not require pain medication and states that she is not having pain to lower extremities with increased fluid intake. She is tolerating oral fluids and appears hydrated.  [name] is functional, alert, oriented, and ambulatory. Patient will follow up in the clinic as scheduled with Dr. Ashley RoyaltyMatthews. Patient encouraged to complete her antibiotic, increase oral fluids to 64 ounces, increase rest, and handwashing.   Discharge Exam:  Filed Vitals:   07/31/14 1438  BP: 124/76  Pulse: 77  Temp: 98.4 F (36.9 C)  Resp: 18   Filed Vitals:   07/31/14 1438  BP: 124/76  Pulse: 77  Temp: 98.4 F (36.9 C)  TempSrc: Oral  Resp: 18  Height: 5\' 7"  (1.702 m)  Weight: 134 lb (60.782 kg)  SpO2: 97%    General: Alert, awake, oriented x3, in no acute distress.  HEENT: Scleral icterus Neck: Trachea midline,  no masses, no thyromegal,y no JVD, no carotid bruit OROPHARYNX:  Bilateral tonsillar hypertrophy, mild oral erythema Heart: Regular rate and rhythm, without murmurs, rubs, gallops, PMI non-displaced, no heaves or thrills on palpation.  Lungs: Clear to auscultation, no wheezing or rhonchi noted. No increased vocal fremitus resonant to percussion  Abdomen: Soft, nontender, nondistended, positive bowel  sounds, no masses no hepatosplenomegaly noted..  Neuro: No focal neurological deficits noted cranial nerves II through XII grossly intact. DTRs 2+ bilaterally upper and lower extremities. Strength 5 out of 5 in bilateral upper and lower extremities. Musculoskeletal: No warm swelling or erythema around joints, no spinal tenderness noted. Psychiatric: Patient alert and oriented x3, good insight and cognition, good recent to remote recall.    Discharge Instructions     Medication List         albuterol (2.5 MG/3ML) 0.083% nebulizer solution  Commonly known as:  PROVENTIL  Take 3 mLs (2.5 mg total) by nebulization every 6 (six) hours as needed for shortness of breath.     azithromycin 250 MG tablet  Commonly known as:  ZITHROMAX  Day 1 (500 mg); days 2-5 (250 mg)     cholecalciferol 1000 UNITS tablet  Commonly known as:  VITAMIN D  Take 1,000 Units by mouth daily.     diphenhydrAMINE 25 MG tablet  Commonly known as:  BENADRYL  Take 25 mg by mouth every 6 (six) hours as needed for itching.     Fluticasone-Salmeterol 250-50 MCG/DOSE Aepb  Commonly known as:  ADVAIR  Inhale 1 puff into the lungs daily.     folic acid 1 MG tablet  Commonly known as:  FOLVITE  Take 1 tablet (1 mg total) by mouth daily.     hydroxyurea 500 MG capsule  Commonly known as:  HYDREA  TAKE ONE CAPSULE BY MOUTH TWICE DAILY     ibuprofen 600 MG tablet  Commonly known as:  ADVIL,MOTRIN  Take 1 tablet (600 mg total) by  mouth every 8 (eight) hours as needed for pain.     mometasone 50 MCG/ACT nasal spray  Commonly known as:  NASONEX  Place 2 sprays into the nose daily.     ondansetron 4 MG tablet  Commonly known as:  ZOFRAN  TAKE 1 TABLET BY MOUTH EVERY 6 HOURS     oxyCODONE-acetaminophen 10-325 MG per tablet  Commonly known as:  PERCOCET  Take 1 tablet every 4 hours for severe pain     promethazine 25 MG tablet  Commonly known as:  PHENERGAN  Take 25 mg by mouth every 6 (six) hours as needed  for nausea.     SUMAtriptan 25 MG tablet  Commonly known as:  IMITREX  Take 1 tablet (25 mg total) by mouth once. May repeat in 2 hours if headache persists or recurs. Not to exceed 2 doses in 24 hours          The results of significant diagnostics from this hospitalization (including imaging, microbiology, ancillary and laboratory) are listed below for reference.    Significant Diagnostic Studies: No results found.  Microbiology: No results found for this or any previous visit (from the past 240 hour(s)).   Labs: Basic Metabolic Panel:  Recent Labs Lab 07/31/14 1457  NA 136*  K 4.2  CL 104  CO2 16*  GLUCOSE 82  BUN 19  CREATININE 0.76  CALCIUM 8.4   Liver Function Tests:  Recent Labs Lab 07/31/14 1457  AST 77*  ALT 27  ALKPHOS 308*  BILITOT 11.0*  PROT 7.6  ALBUMIN 2.9*   No results found for this basename: LIPASE, AMYLASE,  in the last 168 hours No results found for this basename: AMMONIA,  in the last 168 hours CBC:  Recent Labs Lab 07/31/14 1457  WBC 9.7  NEUTROABS 6.6  HGB 7.8*  HCT 21.5*  MCV 108.0*  PLT 175   Cardiac Enzymes: No results found for this basename: CKTOTAL, CKMB, CKMBINDEX, TROPONINI,  in the last 168 hours BNP: No components found with this basename: POCBNP,  CBG: No results found for this basename: GLUCAP,  in the last 168 hours Ferritin: No results found for this basename: FERRITIN,  in the last 168 hours  Time coordinating discharge:Greater than 30 minutes Signed:  Kyreese Chio M  07/31/2014, 6:45 PM

## 2014-07-31 NOTE — H&P (Deleted)
[facility] History and Physical   Date: 07/31/2014  Patient name: Joan Martin Medical record number: [id number] Date of birth: 11/16/1985 Age: 28 y.o. Gender: female PCP: MATTHEWS,MICHELLE A., MD  Attending physician: Altha HarmMichelle A Matthews, MD  Chief Complaint: Dehydration  History of Present Illness: [name], a patient with a history of sickle cell anemia, HbSS presents to the day infusion center with dehydration. Patient has had a 3 day history of an upper respiratory infection with fatigue, sinus pressure, cough, congestion, and scratchy throat. She states that it has been difficult to eat and drink over the past couple of days. She maintains that eyes are yellow and she is currently has 2/10 pain to lower extremities. Patient is opiate naive and takes minimal pain medication for sickle cell anemia.  She states that she has been managing pain with Ibuprofen and she consistently takes Hydrea and folic acid. Patient was evaluated for influenza in primary care, which yielded a negative test result. Patient is being treated for an upper respiratory on antibiotic therapy. [name] denies chest pain, vomiting, or diarrhea. Will admit to day infusion center for extended observation for increased fluids.    Meds: Prescriptions prior to admission  Medication Sig Dispense Refill  . cholecalciferol (VITAMIN D) 1000 UNITS tablet Take 1,000 Units by mouth daily.      . diphenhydrAMINE (BENADRYL) 25 MG tablet Take 25 mg by mouth every 6 (six) hours as needed for itching.      . folic acid (FOLVITE) 1 MG tablet Take 1 tablet (1 mg total) by mouth daily.  30 tablet  11  . hydroxyurea (HYDREA) 500 MG capsule TAKE ONE CAPSULE BY MOUTH TWICE DAILY  180 capsule  0  . ibuprofen (ADVIL,MOTRIN) 600 MG tablet Take 1 tablet (600 mg total) by mouth every 8 (eight) hours as needed for pain.  30 tablet  0  . mometasone (NASONEX) 50 MCG/ACT nasal spray Place 2 sprays into the nose daily.      .  ondansetron (ZOFRAN) 4 MG tablet TAKE 1 TABLET BY MOUTH EVERY 6 HOURS  12 tablet  0  . oxyCODONE-acetaminophen (PERCOCET) 10-325 MG per tablet Take 1 tablet every 4 hours for severe pain  60 tablet  0  . SUMAtriptan (IMITREX) 25 MG tablet Take 1 tablet (25 mg total) by mouth once. May repeat in 2 hours if headache persists or recurs. Not to exceed 2 doses in 24 hours  10 tablet  0  . albuterol (PROVENTIL) (2.5 MG/3ML) 0.083% nebulizer solution Take 3 mLs (2.5 mg total) by nebulization every 6 (six) hours as needed for shortness of breath.  75 mL  12  . Fluticasone-Salmeterol (ADVAIR) 250-50 MCG/DOSE AEPB Inhale 1 puff into the lungs daily.      . promethazine (PHENERGAN) 25 MG tablet Take 25 mg by mouth every 6 (six) hours as needed for nausea.        Allergies: Cephalosporins; Citrus; and Morphine and related Past Medical History  Diagnosis Date  . Sickle cell anemia   . Stroke     7 years ago  . Sickle cell anemia   . Asthma    Past Surgical History  Procedure Laterality Date  . Cholecystectomy     Family History  Problem Relation Age of Onset  . Diabetes Mother   . Hypertension Mother   . Diabetes Father   . Hypertension Father   . Cancer Maternal Aunt    History   Social History  .  Marital Status: Single    Spouse Name: N/A    Number of Children: N/A  . Years of Education: N/A   Occupational History  . Not on file.   Social History Main Topics  . Smoking status: Never Smoker   . Smokeless tobacco: Not on file  . Alcohol Use: No  . Drug Use: No  . Sexual Activity: Yes    Birth Control/ Protection: Condom   Other Topics Concern  . Not on file   Social History Narrative  . No narrative on file    Review of Systems: Constitutional: positive for chills, fatigue and fevers Eyes: positive for icterus Ears, nose, mouth, throat, and face: positive for nasal congestion and sore throat Respiratory: negative Cardiovascular: negative Gastrointestinal:  negative Genitourinary:negative Hematologic/lymphatic: negative Musculoskeletal:positive for myalgias Neurological: negative Behavioral/Psych: negative Endocrine: negative Allergic/Immunologic: negative  Physical Exam: Blood pressure 124/76, pulse 77, temperature 98.4 F (36.9 C), temperature source Oral, resp. rate 18, height 5\' 7"  (1.702 m), weight 134 lb (60.782 kg), last menstrual period 07/08/2014, SpO2 97.00%. BP 124/76  Pulse 77  Temp(Src) 98.4 F (36.9 C) (Oral)  Resp 18  Ht 5\' 7"  (1.702 m)  Wt 134 lb (60.782 kg)  BMI 20.98 kg/m2  SpO2 97%  LMP 07/08/2014 General appearance: alert, cooperative, fatigued and icteric Head: Normocephalic, without obvious abnormality, atraumatic Eyes: positive findings: sclera icterus Ears: normal TM's and external ear canals both ears Nose: Nares normal. Septum midline. Mucosa normal. No drainage or sinus tenderness., sinus tenderness bilateral Throat: abnormal findings: mild oropharyngeal erythema and tonsillar hypertrophy 2+ Neck: no adenopathy, no carotid bruit, no JVD, supple, symmetrical, trachea midline and thyroid not enlarged, symmetric, no tenderness/mass/nodules Back: symmetric, no curvature. ROM normal. No CVA tenderness. Lungs: clear to auscultation bilaterally Heart: regular rate and rhythm, S1, S2 normal, no murmur, click, rub or gallop Abdomen: soft, non-tender; bowel sounds normal; no masses,  no organomegaly Extremities: extremities normal, atraumatic, no cyanosis or edema Pulses: 2+ and symmetric Neurologic: Alert and oriented X 3, normal strength and tone. Normal symmetric reflexes. Normal coordination and gait  Lab results: Results for orders placed in visit on 07/31/14 (from the past 24 hour(s))  POCT INFLUENZA A/B     Status: Normal   Collection Time    07/31/14  2:10 PM      Result Value Ref Range   Influenza A, POC Negative     Influenza B, POC Negative      Imaging results:  No results  found.   Assessment & Plan: Sickle cell anemia: Dehydration: Start D5.45 at 125 for cellular rehydration.   Will continue to monitor lower extremity pain during extended observation. Patient states that she is functional at 2/10.  Will check CBC w/ differential and CMP Will monitor closely throughout extended observation.   Kami Kube M 07/31/2014, 2:59 PM    Addendum: Procedure IVFs r/f to office note

## 2014-08-01 LAB — URINALYSIS, COMPLETE
CASTS: NONE SEEN
Crystals: NONE SEEN
Glucose, UA: NEGATIVE mg/dL
KETONES UR: NEGATIVE mg/dL
NITRITE: NEGATIVE
PH: 5.5 (ref 5.0–8.0)
Protein, ur: 30 mg/dL — AB
Specific Gravity, Urine: 1.013 (ref 1.005–1.030)
Squamous Epithelial / LPF: NONE SEEN
Urobilinogen, UA: 2 mg/dL — ABNORMAL HIGH (ref 0.0–1.0)
WBC, UA: >50 WBC/hpf — AB (ref <3)

## 2014-08-07 ENCOUNTER — Telehealth: Payer: Self-pay | Admitting: Internal Medicine

## 2014-08-07 DIAGNOSIS — R300 Dysuria: Secondary | ICD-10-CM | POA: Insufficient documentation

## 2014-08-07 DIAGNOSIS — N390 Urinary tract infection, site not specified: Secondary | ICD-10-CM | POA: Insufficient documentation

## 2014-08-07 MED ORDER — CIPROFLOXACIN HCL 250 MG PO TABS: 250.0000 mg | ORAL_TABLET | Freq: Two times a day (BID) | ORAL | Status: DC

## 2014-08-07 NOTE — Telephone Encounter (Signed)
Patient would like to know the results of her last urinalysis.

## 2014-08-07 NOTE — Telephone Encounter (Signed)
Joan Martin, patient is asking for UA results. Is there anything I can let her know. Please advise. Thanks!

## 2014-08-07 NOTE — Telephone Encounter (Signed)
Meds ordered this encounter  Medications  . ciprofloxacin (CIPRO) 250 MG tablet    Sig: Take 1 tablet (250 mg total) by mouth 2 (two) times daily.    Dispense:  6 tablet    Refill:  0    Order Specific Question:  Supervising Provider    Answer:  MATTHEWS, MICHELLE A [[id number]]  Recommended increasing water intake to 6-8 glasses per day Add cranberry juice to fluid regimen.   Massie MaroonHollis,Kenli Waldo M, FNP

## 2014-10-19 ENCOUNTER — Encounter: Payer: Self-pay | Admitting: Family Medicine

## 2014-10-19 ENCOUNTER — Ambulatory Visit (INDEPENDENT_AMBULATORY_CARE_PROVIDER_SITE_OTHER): Payer: Medicaid Other | Admitting: Family Medicine

## 2014-10-19 VITALS — BP 128/73 | HR 88 | Temp 98.3°F | Resp 16 | Ht 67.0 in | Wt 136.0 lb

## 2014-10-19 DIAGNOSIS — R110 Nausea: Secondary | ICD-10-CM

## 2014-10-19 DIAGNOSIS — G44209 Tension-type headache, unspecified, not intractable: Secondary | ICD-10-CM

## 2014-10-19 DIAGNOSIS — D571 Sickle-cell disease without crisis: Secondary | ICD-10-CM

## 2014-10-19 DIAGNOSIS — R51 Headache: Secondary | ICD-10-CM

## 2014-10-19 DIAGNOSIS — R519 Headache, unspecified: Secondary | ICD-10-CM

## 2014-10-19 MED ORDER — ONDANSETRON HCL 4 MG PO TABS: 4.0000 mg | ORAL_TABLET | Freq: Four times a day (QID) | ORAL | Status: AC

## 2014-10-19 MED ORDER — SUMATRIPTAN SUCCINATE 25 MG PO TABS: 25.0000 mg | ORAL_TABLET | Freq: Once | ORAL | Status: AC

## 2014-10-19 NOTE — Patient Instructions (Signed)
Migraine Headache °A migraine headache is an intense, throbbing pain on one or both sides of your head. A migraine can last for 30 minutes to several hours. °CAUSES  °The exact cause of a migraine headache is not always known. However, a migraine may be caused when nerves in the brain become irritated and release chemicals that cause inflammation. This causes pain. °Certain things may also trigger migraines, such as: °· Alcohol. °· Smoking. °· Stress. °· Menstruation. °· Aged cheeses. °· Foods or drinks that contain nitrates, glutamate, aspartame, or tyramine. °· Lack of sleep. °· Chocolate. °· Caffeine. °· Hunger. °· Physical exertion. °· Fatigue. °· Medicines used to treat chest pain (nitroglycerine), birth control pills, estrogen, and some blood pressure medicines. °SIGNS AND SYMPTOMS °· Pain on one or both sides of your head. °· Pulsating or throbbing pain. °· Severe pain that prevents daily activities. °· Pain that is aggravated by any physical activity. °· Nausea, vomiting, or both. °· Dizziness. °· Pain with exposure to bright lights, loud noises, or activity. °· General sensitivity to bright lights, loud noises, or smells. °Before you get a migraine, you may get warning signs that a migraine is coming (aura). An aura may include: °· Seeing flashing lights. °· Seeing bright spots, halos, or zigzag lines. °· Having tunnel vision or blurred vision. °· Having feelings of numbness or tingling. °· Having trouble talking. °· Having muscle weakness. °DIAGNOSIS  °A migraine headache is often diagnosed based on: °· Symptoms. °· Physical exam. °· A CT scan or MRI of your head. These imaging tests cannot diagnose migraines, but they can help rule out other causes of headaches. °TREATMENT °Medicines may be given for pain and nausea. Medicines can also be given to help prevent recurrent migraines.  °HOME CARE INSTRUCTIONS °· Only take over-the-counter or prescription medicines for pain or discomfort as directed by your  health care provider. The use of long-term narcotics is not recommended. °· Lie down in a dark, quiet room when you have a migraine. °· Keep a journal to find out what may trigger your migraine headaches. For example, write down: °¨ What you eat and drink. °¨ How much sleep you get. °¨ Any change to your diet or medicines. °· Limit alcohol consumption. °· Quit smoking if you smoke. °· Get 7-9 hours of sleep, or as recommended by your health care provider. °· Limit stress. °· Keep lights dim if bright lights bother you and make your migraines worse. °SEEK IMMEDIATE MEDICAL CARE IF:  °· Your migraine becomes severe. °· You have a fever. °· You have a stiff neck. °· You have vision loss. °· You have muscular weakness or loss of muscle control. °· You start losing your balance or have trouble walking. °· You feel faint or pass out. °· You have severe symptoms that are different from your first symptoms. °MAKE SURE YOU:  °· Understand these instructions. °· Will watch your condition. °· Will get help right away if you are not doing well or get worse. °Document Released: 10/06/2005 Document Revised: 02/20/2014 Document Reviewed: 06/13/2013 °[employer]® Patient Information ©2015 [employer]. This information is not intended to replace advice given to you by your health care provider. Make sure you discuss any questions you have with your health care provider. ° °Recurrent Migraine Headache °A migraine headache is an intense, throbbing pain on one or both sides of your head. Recurrent migraines keep coming back. A migraine can last for 30 minutes to several hours. °CAUSES  °The exact cause   of a migraine headache is not always known. However, a migraine may be caused when nerves in the brain become irritated and release chemicals that cause inflammation. This causes pain. °Certain things may also trigger migraines, such as:  °· Alcohol. °· Smoking. °· Stress. °· Menstruation. °· Aged cheeses. °· Foods or drinks that contain  nitrates, glutamate, aspartame, or tyramine. °· Lack of sleep. °· Chocolate. °· Caffeine. °· Hunger. °· Physical exertion. °· Fatigue. °· Medicines used to treat chest pain (nitroglycerine), birth control pills, estrogen, and some blood pressure medicines. °SYMPTOMS  °· Pain on one or both sides of your head. °· Pulsating or throbbing pain. °· Severe pain that prevents daily activities. °· Pain that is aggravated by any physical activity. °· Nausea, vomiting, or both. °· Dizziness. °· Pain with exposure to bright lights, loud noises, or activity. °· General sensitivity to bright lights, loud noises, or smells. °Before you get a migraine, you may get warning signs that a migraine is coming (aura). An aura may include: °· Seeing flashing lights. °· Seeing bright spots, halos, or zigzag lines. °· Having tunnel vision or blurred vision. °· Having feelings of numbness or tingling. °· Having trouble talking. °· Having muscle weakness. °DIAGNOSIS  °A recurrent migraine headache is often diagnosed based on: °· Symptoms. °· Physical examination. °· A CT scan or MRI of your head. These imaging tests cannot diagnose migraines but can help rule out other causes of headaches.   °TREATMENT  °Medicines may be given for pain and nausea. Medicines can also be given to help prevent recurrent migraines. °HOME CARE INSTRUCTIONS °· Only take over-the-counter or prescription medicines for pain or discomfort as directed by your health care provider. The use of long-term narcotics is not recommended. °· Lie down in a dark, quiet room when you have a migraine. °· Keep a journal to find out what may trigger your migraine headaches. For example, write down: °¨ What you eat and drink. °¨ How much sleep you get. °¨ Any change to your diet or medicines. °· Limit alcohol consumption. °· Quit smoking if you smoke. °· Get 7-9 hours of sleep, or as recommended by your health care provider. °· Limit stress. °· Keep lights dim if bright lights bother  you and make your migraines worse. °SEEK MEDICAL CARE IF:  °· You do not get relief from the medicines given to you. °· You have a recurrence of pain. °· You have a fever. °SEEK IMMEDIATE MEDICAL CARE IF: °· Your migraine becomes severe. °· You have a stiff neck. °· You have loss of vision. °· You have muscular weakness or loss of muscle control. °· You start losing your balance or have trouble walking. °· You feel faint or pass out. °· You have severe symptoms that are different from your first symptoms. °MAKE SURE YOU:  °· Understand these instructions. °· Will watch your condition. °· Will get help right away if you are not doing well or get worse. °Document Released: 07/01/2001 Document Revised: 02/20/2014 Document Reviewed: 06/13/2013 °[employer]® Patient Information ©2015 [employer]. This information is not intended to replace advice given to you by your health care provider. Make sure you discuss any questions you have with your health care provider. ° °

## 2014-10-19 NOTE — Progress Notes (Signed)
Subjective:    Patient ID: Joan Martin, female    DOB: 02/22/1986, 28 y.o.   MRN: [id number]  HPI  Joan Martin presents with headache complaints. 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 Symptoms began about 1 day ago. Generally, the headaches last about several hours and occur at least weekly. The headache do not seem to be related to any time of the day. The headaches are usually throbbing and  varies in location.  The patient rates her most severe headaches a 6 on a scale from 1 to 10. Recently, the headaches are increasing in frequency. School attendance or other daily activities are not affected by the headaches. Precipitating factors include none which have been determinedThe headaches are usually not preceded by an aura.  The patient denies decreased physical activity, depression, dizziness, loss of balance, muscle weakness, numbness of extremities, speech difficulties, vision problems, vomiting in the early morning and worsening school/work performance. Other associated symptoms include: nausea.  Symptoms which are not present include: abdominal pain, appetite decrease, chest pain, cough, diarrhea, dizziness, earache, fatigue, fever, hoarseness, irritability, nasal congestion, photophobia and rash. Home treatment has included darkening the room, resting and sleeping with fair improvement. Other history includes: migraine headaches diagnosed in the past and tension headaches diagnosed in the past.   Past Medical History  Diagnosis Date  . Sickle cell anemia   . Stroke     7 years ago  . Sickle cell anemia   . Asthma     Review of Systems  Constitutional: Negative.  Negative for fever, fatigue and unexpected weight change.  Eyes: Negative.  Negative for photophobia and pain.  Respiratory: Negative.   Cardiovascular: Negative.   Gastrointestinal: Negative.   Endocrine: Negative.   Genitourinary: Negative.    Musculoskeletal: Negative.  Negative for neck pain and neck stiffness.  Skin: Negative.   Allergic/Immunologic: Negative.   Neurological: Positive for headaches. Negative for dizziness, tremors, seizures, syncope, facial asymmetry, speech difficulty, weakness, light-headedness and numbness.  Hematological: Negative.   Psychiatric/Behavioral: Negative.  Negative for sleep disturbance.       Objective:   Physical Exam  Constitutional: She is oriented to person, place, and time. She appears well-developed and well-nourished.  HENT:  Head: Normocephalic and atraumatic.  Right Ear: External ear normal.  Left Ear: External ear normal.  Mouth/Throat: Oropharynx is clear and moist.  Eyes: Conjunctivae and EOM are normal. Pupils are equal, round, and reactive to light. Scleral icterus is present.  Neck: Normal range of motion. Neck supple.  Cardiovascular: Normal rate, regular rhythm, intact distal pulses and normal pulses.   Murmur heard. Pulmonary/Chest: Effort normal and breath sounds normal.  Abdominal: Soft. Bowel sounds are normal.  Musculoskeletal: Normal range of motion.  Neurological: She is alert and oriented to person, place, and time. She has normal strength and normal reflexes. She displays no tremor and normal reflexes. No cranial nerve deficit or sensory deficit. She exhibits normal muscle tone. She displays a negative Romberg sign.  Skin: Skin is warm and dry.  Psychiatric: She has a normal mood and affect. Her behavior is normal. Judgment and thought content normal.         BP 128/73 mmHg  Pulse 88  Temp(Src) 98.3 F (36.8 C) (Oral)  Resp 16  Ht 5\' 7"  (1.702 m)  Wt 136 lb (61.689 kg)  BMI 21.30 kg/m2  LMP 09/30/2014 Assessment & Plan:  1. Frequent headaches  Joan Martin reports that she has frequent headaches that mostly occur at work. She states that headaches have been occurring at least once per week. She was started on a trial of Imitrex last September by Dr.  Ashley RoyaltyMatthews; she states that Imitrex helped headaches significantly. I will re-start Imitrex. Joan Martin had an ischemic event involving her right optic nerve in 2004. She does not have any new complaints of weakness, numbness, or tingling.   2. Tension-type headache, not intractable, unspecified chronicity pattern Joan Martin is to maintain headache diary and bring to follow up appointment.  - SUMAtriptan (IMITREX) 25 MG tablet; Take 1 tablet (25 mg total) by mouth once. May repeat in 2 hours if headache persists or recurs. 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 She states that she last had Percocet 3 nights ago for low back pain. 1. Sickle cell disease - Continue Hydrea 500 mg twice daily. We discussed the need for good hydration, monitoring of hydration status, avoidance of heat, cold, stress, and infection triggers. We discussed the risks and benefits of Hydrea, including bone marrow suppression, the possibility of GI upset, skin ulcers, hair thinning, and teratogenicity. The patient was reminded of the need to seek medical attention of any symptoms of bleeding, anemia, or infection. Continue folic acid 1 mg daily to prevent aplastic bone marrow crises.   Last eye exam: 02/20/2014 by Dr. Addison Naegeliochelle Davis, no new ocular changes today. She has an appointment scheduled for a visual fields test.   - CBC with Differential; Future  4. Nausea - ondansetron (ZOFRAN) 4 MG tablet; Take 1 tablet (4 mg total) by mouth every 6 (six) hours.  Dispense: 20 tablet; Refill: 0   RTC: 1 month for F/U SCD and frequent  Luke Falero M, FNP

## 2014-11-08 ENCOUNTER — Other Ambulatory Visit (INDEPENDENT_AMBULATORY_CARE_PROVIDER_SITE_OTHER): Payer: Medicaid Other

## 2014-11-08 DIAGNOSIS — E559 Vitamin D deficiency, unspecified: Secondary | ICD-10-CM

## 2014-11-08 DIAGNOSIS — D571 Sickle-cell disease without crisis: Secondary | ICD-10-CM

## 2014-11-09 LAB — CBC WITH DIFFERENTIAL/PLATELET
BASOS PCT: 3 % — AB (ref 0–1)
Basophils Absolute: 0.2 10*3/uL — ABNORMAL HIGH (ref 0.0–0.1)
EOS ABS: 0.2 10*3/uL (ref 0.0–0.7)
Eosinophils Relative: 2 % (ref 0–5)
HEMATOCRIT: 24.2 % — AB (ref 36.0–46.0)
Hemoglobin: 8.4 g/dL — ABNORMAL LOW (ref 12.0–15.0)
Lymphocytes Relative: 29 % (ref 12–46)
Lymphs Abs: 2.2 10*3/uL (ref 0.7–4.0)
MCH: 40 pg — ABNORMAL HIGH (ref 26.0–34.0)
MCHC: 34.7 g/dL (ref 30.0–36.0)
MCV: 115.2 fL — AB (ref 78.0–100.0)
MONO ABS: 0.8 10*3/uL (ref 0.1–1.0)
MPV: 10 fL (ref 8.6–12.4)
Monocytes Relative: 11 % (ref 3–12)
Neutro Abs: 4.2 10*3/uL (ref 1.7–7.7)
Neutrophils Relative %: 55 % (ref 43–77)
PLATELETS: 226 10*3/uL (ref 150–400)
RBC: 2.1 MIL/uL — ABNORMAL LOW (ref 3.87–5.11)
RDW: 21.2 % — AB (ref 11.5–15.5)
WBC: 7.7 10*3/uL (ref 4.0–10.5)

## 2014-11-09 LAB — FERRITIN: Ferritin: 902 ng/mL — ABNORMAL HIGH (ref 10–291)

## 2014-11-09 LAB — VITAMIN D 25 HYDROXY (VIT D DEFICIENCY, FRACTURES): VIT D 25 HYDROXY: 20 ng/mL — AB (ref 30–100)

## 2014-11-23 ENCOUNTER — Ambulatory Visit (INDEPENDENT_AMBULATORY_CARE_PROVIDER_SITE_OTHER): Payer: Medicaid Other | Admitting: Family Medicine

## 2014-11-23 VITALS — BP 138/88 | HR 94 | Temp 98.6°F | Resp 18 | Ht 67.0 in | Wt 136.0 lb

## 2014-11-23 DIAGNOSIS — J029 Acute pharyngitis, unspecified: Secondary | ICD-10-CM | POA: Diagnosis not present

## 2014-11-23 DIAGNOSIS — D571 Sickle-cell disease without crisis: Secondary | ICD-10-CM

## 2014-11-23 DIAGNOSIS — E559 Vitamin D deficiency, unspecified: Secondary | ICD-10-CM

## 2014-11-23 DIAGNOSIS — R519 Headache, unspecified: Secondary | ICD-10-CM

## 2014-11-23 DIAGNOSIS — R51 Headache: Secondary | ICD-10-CM

## 2014-11-23 DIAGNOSIS — R0981 Nasal congestion: Secondary | ICD-10-CM

## 2014-11-23 MED ORDER — MOMETASONE FUROATE 50 MCG/ACT NA SUSP: 2.0000 | Freq: Every day | NASAL | Status: AC

## 2014-11-23 MED ORDER — ERGOCALCIFEROL 1.25 MG (50000 UT) PO CAPS: 50000.0000 [IU] | ORAL_CAPSULE | ORAL | Status: DC

## 2014-11-23 NOTE — Progress Notes (Signed)
Subjective:    Patient ID: Joan Martin, female    DOB: [birth year], 29 y.o.   MRN: [id number]  HPI  Joan Martin, a patient with a history of sickle cell anemia, HbSS presents for follow up of sickle cell anemia and frequent headaches.   She states that she has been managing sickle cell anemia well on current medications regimen. She maintains that she consistently takes Hydroxyurea and folic acid. She denies pain, fatigue, hair loss, nausea, vomiting , or diarrhea.    She also complains of frequent headaches. 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 Symptoms began about 1 day ago. Generally, the headaches last about several hours and occur at least weekly. The headache do not seem to be related to any time of the day. The headaches are usually throbbing and varies in location. The patient rates her most severe headaches a 4 on a scale from 1 to 10. Recently, the headaches are increasing in frequency. School attendance or other daily activities are not affected by the headaches. Precipitating factors include none which have been determinedThe headaches are usually not preceded by an aura. The patient denies decreased physical activity, depression, dizziness, loss of balance, muscle weakness, numbness of extremities, speech difficulties, vision problems, vomiting in the early morning and worsening school/work performance. Other associated symptoms include: nausea. Symptoms which are not present include: abdominal pain, appetite decrease, chest pain, cough, diarrhea, dizziness, earache, fatigue, fever, hoarseness, irritability, nasal congestion, photophobia and rash. Home treatment has included darkening the room, resting and sleeping with fair improvement.   Patient complains of sore throat. Associated symptoms include post nasal drip and sore throat.Onset of symptoms was 2 days ago, unchanged since that time. She is drinking  plenty of fluids. She has had recent close exposure to someone with proven streptococcal pharyngitis, she works at a daycare.   Past Medical History  Diagnosis Date  . Sickle cell anemia   . Stroke     7 years ago  . Sickle cell anemia   . Asthma    Allergies  Allergen Reactions  . Cephalosporins     Possible family reaction  . Citrus Other (See Comments)    Severity varies/ Reaction varies hives around mouth to swelling around mouth and lips.   . Morphine And Related Hives    Patient states she can take IV dilaudid with benadryl    Review of Systems  Constitutional: Negative.   HENT: Positive for postnasal drip and sore throat.   Eyes: Negative.   Respiratory: Negative.   Cardiovascular: Negative.   Gastrointestinal: Negative.   Endocrine: Negative.   Genitourinary: Negative.   Musculoskeletal: Negative.   Skin: Negative.   Allergic/Immunologic: Negative.   Neurological: Positive for headaches. Negative for seizures, syncope and numbness.  Hematological: Negative.   Psychiatric/Behavioral: Negative.  Negative for suicidal ideas and sleep disturbance.       Objective:   Physical Exam  Constitutional: She appears well-developed and well-nourished.  HENT:  Head: Normocephalic and atraumatic.  Right Ear: External ear normal.  Mouth/Throat: Posterior oropharyngeal erythema present.  Eyes: Conjunctivae and EOM are normal. Pupils are equal, round, and reactive to light. Scleral icterus is present.  Neck: Normal range of motion. Neck supple.  Cardiovascular: Normal rate, normal heart sounds and intact distal pulses.   Pulmonary/Chest: Effort normal and breath sounds normal.  Abdominal: Soft. Bowel sounds are normal.  Neurological: She is alert. She has normal  reflexes. She displays no atrophy and no tremor. No cranial nerve deficit or sensory deficit. She exhibits normal muscle tone. She displays no seizure activity. Coordination and gait normal.  Skin: Skin is warm and  dry.  Psychiatric: She has a normal mood and affect. Her behavior is normal. Judgment and thought content normal.         BP 150/88 mmHg  Pulse 94  Temp(Src) 98.6 F (37 C) (Oral)  Resp 18  Ht 5\' 7"  (1.702 m)  Wt 136 lb (61.689 kg)  BMI 21.30 kg/m2  LMP 11/23/2014  Assessment & Plan:  Hb-SS disease without crisis  Sickle cell disease - Continue Hydrea 500 mg twice daily. We discussed the need for good hydration, monitoring of hydration status, avoidance of heat, cold, stress, and infection triggers. We discussed the risks and benefits of Hydrea, including bone marrow suppression, the possibility of GI upset, skin ulcers, hair thinning, and teratogenicity. The patient was reminded of the need to seek medical attention of any symptoms of bleeding, anemia, or infection. Continue folic acid 1 mg daily to prevent aplastic bone marrow crises.   Pulmonary evaluation - Patient denies severe recurrent wheezes, shortness of breath with exercise, or persistent cough. If these symptoms develop, pulmonary function tests with spirometry will be ordered, and if abnormal, plan on referral to Pulmonology for further evaluation.  Cardiac - Routine screening for pulmonary hypertension is not recommended.  Eye - High risk of proliferative retinopathy. Annual eye exam with retinal exam recommended to patient.   Immunization status - She declines vaccines today. Yearly influenza vaccination is recommended, as well as being up to date with Meningococcal and Pneumococcal vaccines.   Acute and chronic painful episodes -Patient rarely uses opiate medications for pain management.   Vitamin D deficiency - Drisdol 50,000 units weekly, we encouraged her to take it.   The above recommendations are taken from the NIH Evidence-Based Management of Sickle Cell Disease: Expert Panel Report, [id number].    2. Frequent headaches Joan Martin reports that she has frequent headaches that mostly occur at work. She states that  headaches have been occurring at least once per week. She was started on a trial of Imitrex last September by Dr. Ashley RoyaltyMatthews; she states that Imitrex helped headaches minimally. Joan Martin had an ischemic event involving her right optic nerve in 2004. She does not have any new complaints of weakness, numbness, or tingling.   -I will send a referral to neurology for evaluation of frequent headaches  3. Throat soreness Strep test negative. I recommend Ibuprofen as directed for mild to moderate throat pain. Also, Joan Martin should continue to increase fluid intake, increase rest, and wash hands frequently.  - Rapid Strep A  4. Vitamin D deficiency Reviewed vitamin D level, I will increase to weekly vitamin D.   - ergocalciferol (VITAMIN D2) 50000 UNITS capsule; Take 1 capsule (50,000 Units total) by mouth once a week.  Dispense: 4 capsule; Refill: 3  5. Nasal congestion - mometasone (NASONEX) 50 MCG/ACT nasal spray; Place 2 sprays into the nose daily.  Dispense: 17 g; Refill: 0   Hollis,Lachina M, FNP

## 2014-11-24 LAB — URINALYSIS, COMPLETE
BACTERIA UA: NONE SEEN
Casts: NONE SEEN
Crystals: NONE SEEN
Glucose, UA: NEGATIVE mg/dL
NITRITE: NEGATIVE
Protein, ur: NEGATIVE mg/dL
Specific Gravity, Urine: 1.005 (ref 1.005–1.030)
Squamous Epithelial / LPF: NONE SEEN
Urobilinogen, UA: 1 mg/dL (ref 0.0–1.0)
pH: 6 (ref 5.0–8.0)

## 2014-11-28 ENCOUNTER — Encounter: Payer: Self-pay | Admitting: Family Medicine

## 2014-11-28 LAB — POCT RAPID STREP A (OFFICE): Rapid Strep A Screen: NEGATIVE

## 2014-12-28 ENCOUNTER — Emergency Department (HOSPITAL_COMMUNITY)
Admission: EM | Admit: 2014-12-28 | Discharge: 2014-12-28 | Disposition: A | Discharge status: Home / Self Care | Payer: Medicaid Other | Source: Home / Self Care | Attending: Family Medicine | Admitting: Family Medicine

## 2014-12-28 ENCOUNTER — Encounter (HOSPITAL_COMMUNITY): Payer: Self-pay | Admitting: Emergency Medicine

## 2014-12-28 DIAGNOSIS — D571 Sickle-cell disease without crisis: Secondary | ICD-10-CM

## 2014-12-28 DIAGNOSIS — J020 Streptococcal pharyngitis: Secondary | ICD-10-CM

## 2014-12-28 LAB — POCT RAPID STREP A: Streptococcus, Group A Screen (Direct): POSITIVE — AB

## 2014-12-28 MED ORDER — AMOXICILLIN 500 MG PO CAPS: 500.0000 mg | ORAL_CAPSULE | Freq: Two times a day (BID) | ORAL | Status: DC

## 2014-12-28 NOTE — ED Provider Notes (Signed)
Rogers Seedsatrice Marciano is a 29 y.o. female who presents to Urgent Care today for sore throat fevers body aches. No vomiting diarrhea chest pain or palpitations. Symptoms are not consistent with pain crisis. Patient has not tried any medications. Symptoms present starting today. Patient notes oxygen saturation of 84% on room air is normal for her.   Past Medical History  Diagnosis Date  . Sickle cell anemia   . Stroke     7 years ago  . Sickle cell anemia   . Asthma    Past Surgical History  Procedure Laterality Date  . Cholecystectomy     History  Substance Use Topics  . Smoking status: Never Smoker   . Smokeless tobacco: Not on file  . Alcohol Use: No   ROS as above Medications: No current facility-administered medications for this encounter.   Current Outpatient Prescriptions  Medication Sig Dispense Refill  . albuterol (PROVENTIL) (2.5 MG/3ML) 0.083% nebulizer solution Take 3 mLs (2.5 mg total) by nebulization every 6 (six) hours as needed for shortness of breath. (Patient not taking: Reported on 10/19/2014) 75 mL 12  . amoxicillin (AMOXIL) 500 MG capsule Take 1 capsule (500 mg total) by mouth 2 (two) times daily. 14 capsule 0  . diphenhydrAMINE (BENADRYL) 25 MG tablet Take 25 mg by mouth every 6 (six) hours as needed for itching.    . ergocalciferol (VITAMIN D2) 50000 UNITS capsule Take 1 capsule (50,000 Units total) by mouth once a week. 4 capsule 3  . Fluticasone-Salmeterol (ADVAIR) 250-50 MCG/DOSE AEPB Inhale 1 puff into the lungs daily.    . folic acid (FOLVITE) 1 MG tablet Take 1 tablet (1 mg total) by mouth daily. 30 tablet 11  . hydroxyurea (HYDREA) 500 MG capsule TAKE ONE CAPSULE BY MOUTH TWICE DAILY 180 capsule 0  . ibuprofen (ADVIL,MOTRIN) 600 MG tablet Take 1 tablet (600 mg total) by mouth every 8 (eight) hours as needed for pain. 30 tablet 0  . mometasone (NASONEX) 50 MCG/ACT nasal spray Place 2 sprays into the nose daily. 17 g 0  . ondansetron (ZOFRAN) 4 MG tablet Take 1  tablet (4 mg total) by mouth every 6 (six) hours. 20 tablet 0  . oxyCODONE-acetaminophen (PERCOCET) 10-325 MG per tablet Take 1 tablet every 4 hours for severe pain 60 tablet 0  . promethazine (PHENERGAN) 25 MG tablet Take 25 mg by mouth every 6 (six) hours as needed for nausea.    . SUMAtriptan (IMITREX) 25 MG tablet Take 1 tablet (25 mg total) by mouth once. May repeat in 2 hours if headache persists or recurs. Not to exceed 2 doses in 24 hours 10 tablet 0   Facility-Administered Medications Ordered in Other Encounters  Medication Dose Route Frequency Provider Last Rate Last Dose  . ondansetron (ZOFRAN) injection 4 mg  4 mg Intravenous Once Grayce SessionsMichelle P Edwards, NP       Allergies  Allergen Reactions  . Cephalosporins     Possible family reaction  . Citrus Other (See Comments)    Severity varies/ Reaction varies hives around mouth to swelling around mouth and lips.   . Morphine And Related Hives    Patient states she can take IV dilaudid with benadryl     Exam:  BP 149/88 mmHg  Temp(Src) 99.3 F (37.4 C) (Oral)  Resp 16  SpO2 94%  LMP 12/18/2014 Gen: Well NAD HEENT: EOMI,  MMM posterior pharynx with large tonsils erythema and exudates. Normal tympanic membranes. Cervical lymphadenopathy is present. Lungs: Normal work of  breathing. CTABL Heart: RRR no MRG Abd: NABS, Soft. Nondistended, Nontender Exts: Brisk capillary refill, warm and well perfused.   Results for orders placed or performed during the hospital encounter of 12/28/14 (from the past 24 hour(s))  POCT rapid strep A [facility])     Status: Abnormal   Collection Time: 12/28/14 11:31 AM  Result Value Ref Range   Streptococcus, Group A Screen (Direct) POSITIVE (A) NEGATIVE   No results found.  Assessment and Plan: 29 y.o. female with strep throat. Treatment with amoxicillin. Return as needed.  Discussed warning signs or symptoms. Please see discharge instructions. Patient expresses understanding.     Rodolph Bong, MD 12/28/14 [phone]

## 2014-12-28 NOTE — Discharge Instructions (Signed)
Thank you for coming in today. Take  amoxicillin twice daily for one week. Return as needed  Strep Throat Strep throat is an infection of the throat caused by a bacteria named Streptococcus pyogenes. Your health care provider may call the infection streptococcal "tonsillitis" or "pharyngitis" depending on whether there are signs of inflammation in the tonsils or back of the throat. Strep throat is most common in children aged 31-15 years during the cold months of the year, but it can occur in people of any age during any season. This infection is spread from person to person (contagious) through coughing, sneezing, or other close contact. SIGNS AND SYMPTOMS   Fever or chills.  Painful, swollen, red tonsils or throat.  Pain or difficulty when swallowing.  White or yellow spots on the tonsils or throat.  Swollen, tender lymph nodes or "glands" of the neck or under the jaw.  Red rash all over the body (rare). DIAGNOSIS  Many different infections can cause the same symptoms. A test must be done to confirm the diagnosis so the right treatment can be given. A "rapid strep test" can help your health care provider make the diagnosis in a few minutes. If this test is not available, a light swab of the infected area can be used for a throat culture test. If a throat culture test is done, results are usually available in a day or two. TREATMENT  Strep throat is treated with antibiotic medicine. HOME CARE INSTRUCTIONS   Gargle with 1 tsp of salt in 1 cup of warm water, 3-4 times per day or as needed for comfort.  Family members who also have a sore throat or fever should be tested for strep throat and treated with antibiotics if they have the strep infection.  Make sure everyone in your household washes their hands well.  Do not share food, drinking cups, or personal items that could cause the infection to spread to others.  You may need to eat a soft food diet until your sore throat gets  better.  Drink enough water and fluids to keep your urine clear or pale yellow. This will help prevent dehydration.  Get plenty of rest.  Stay home from school, day care, or work until you have been on antibiotics for 24 hours.  Take medicines only as directed by your health care provider.  Take your antibiotic medicine as directed by your health care provider. Finish it even if you start to feel better. SEEK MEDICAL CARE IF:   The glands in your neck continue to enlarge.  You develop a rash, cough, or earache.  You cough up green, yellow-brown, or bloody sputum.  You have pain or discomfort not controlled by medicines.  Your problems seem to be getting worse rather than better.  You have a fever. SEEK IMMEDIATE MEDICAL CARE IF:   You develop any new symptoms such as vomiting, severe headache, stiff or painful neck, chest pain, shortness of breath, or trouble swallowing.  You develop severe throat pain, drooling, or changes in your voice.  You develop swelling of the neck, or the skin on the neck becomes red and tender.  You develop signs of dehydration, such as fatigue, dry mouth, and decreased urination.  You become increasingly sleepy, or you cannot wake up completely. MAKE SURE YOU:  Understand these instructions.  Will watch your condition.  Will get help right away if you are not doing well or get worse. Document Released: 10/03/2000 Document Revised: 02/20/2014 Document Reviewed: 12/05/2010  Exit[employer]® Patient Information ©2015 [employer]. This information is not intended to replace advice given to you by your health care provider. Make sure you discuss any questions you have with your health care provider. ° °

## 2014-12-28 NOTE — ED Notes (Signed)
Pt states that she has had chills, with cough and as she states flu like symptoms that started this morning.

## 2015-01-17 ENCOUNTER — Encounter: Payer: Self-pay | Admitting: Neurology

## 2015-01-17 ENCOUNTER — Telehealth: Payer: Self-pay | Admitting: *Deleted

## 2015-01-17 ENCOUNTER — Ambulatory Visit (INDEPENDENT_AMBULATORY_CARE_PROVIDER_SITE_OTHER): Payer: Medicaid Other | Admitting: Neurology

## 2015-01-17 VITALS — BP 130/70 | HR 100 | Temp 98.2°F | Resp 20 | Ht 67.0 in | Wt 131.8 lb

## 2015-01-17 DIAGNOSIS — D571 Sickle-cell disease without crisis: Secondary | ICD-10-CM

## 2015-01-17 DIAGNOSIS — G44049 Chronic paroxysmal hemicrania, not intractable: Secondary | ICD-10-CM

## 2015-01-17 MED ORDER — TOPIRAMATE 50 MG PO TABS
ORAL_TABLET | ORAL | Status: AC
Start: 2015-01-17 — End: ?

## 2015-01-17 MED ORDER — TOPIRAMATE 25 MG PO TABS: ORAL_TABLET | ORAL | Status: DC

## 2015-01-17 NOTE — Patient Instructions (Addendum)
1.  Start topiramate 50mg  tablets.  Take 1/2 tablet twice daily (AM and PM) for 7 days.  Then increase to 1 full tablet twice daily. Possible side effects include: impaired thinking, sedation, paresthesias (numbness and tingling) and weight loss.  It may cause dehydration and there is a small risk for kidney stones, so make sure to stay hydrated with water during the day.  There is also a very small risk for glaucoma, so if you notice any change in your vision while taking this medication, see an ophthalmologist.  There is also a very small risk of possible suicidal ideation, as it the case with all antiepileptic medications. 2.  [facility]   01/22/15 4:45pm 3.  Call in 4 weeks with update.  Follow up in 3 months.

## 2015-01-17 NOTE — Telephone Encounter (Signed)
Topamax 25 mg cancelled at patient pharmacy

## 2015-01-17 NOTE — Progress Notes (Signed)
NEUROLOGY CONSULTATION NOTE  [name] MRN: [id number] DOB: [birth year]  Referring provider: Julianne Handler, FNP Primary care provider: Marthann Schiller, MD  Reason for consult:  Headache  HISTORY OF PRESENT ILLNESS: Joan Martin is a 29 year old right-handed woman with sickle cell anemia, HbSS with history of stroke who presents for headache.  Records and labs reviewed.  She has sickle cell disease.  She developed these current headaches a few months ago.  They are stabbing pain involving the right eye and sometimes back of the right ear.  It occurs in repeated periods lasting up to an hour or until the next day.  It occurs 4 to 5 times a week.  It is accompanied by tearing of the right eye.  Sometimes she notes nausea, but this is a chronic issue anyway.  There is no photophobia, phonophobia or visual disturbance.  There are no triggers or relieving factors.  Laying down does not help.  She is able to function and go to work.  She takes ibuprofen twice a week.  She has tried sumatriptan , which was ineffective.  She had a similar headache in 2004.  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  She denies any vision problems now and she had an unremarkable eye exam.  She has no prior history of headache except for maybe one migraine many years ago.  Her sister has migraine.  PAST MEDICAL HISTORY: Past Medical History  Diagnosis Date  . Sickle cell anemia   . Stroke     7 years ago  . Sickle cell anemia   . Asthma     PAST SURGICAL HISTORY: Past Surgical History  Procedure Laterality Date  . Cholecystectomy      MEDICATIONS: Current Outpatient Prescriptions on File Prior to Visit  Medication Sig Dispense Refill  . albuterol (PROVENTIL) (2.5 MG/3ML) 0.083% nebulizer solution Take 3 mLs (2.5 mg total) by nebulization every 6 (six) hours as needed for shortness of breath. (Patient not taking: Reported on 10/19/2014) 75 mL  12  . amoxicillin (AMOXIL) 500 MG capsule Take 1 capsule (500 mg total) by mouth 2 (two) times daily. (Patient not taking: Reported on 01/17/2015) 14 capsule 0  . diphenhydrAMINE (BENADRYL) 25 MG tablet Take 25 mg by mouth every 6 (six) hours as needed for itching.    . ergocalciferol (VITAMIN D2) 50000 UNITS capsule Take 1 capsule (50,000 Units total) by mouth once a week. 4 capsule 3  . Fluticasone-Salmeterol (ADVAIR) 250-50 MCG/DOSE AEPB Inhale 1 puff into the lungs daily.    . folic acid (FOLVITE) 1 MG tablet Take 1 tablet (1 mg total) by mouth daily. 30 tablet 11  . hydroxyurea (HYDREA) 500 MG capsule TAKE ONE CAPSULE BY MOUTH TWICE DAILY 180 capsule 0  . ibuprofen (ADVIL,MOTRIN) 600 MG tablet Take 1 tablet (600 mg total) by mouth every 8 (eight) hours as needed for pain. 30 tablet 0  . mometasone (NASONEX) 50 MCG/ACT nasal spray Place 2 sprays into the nose daily. 17 g 0  . ondansetron (ZOFRAN) 4 MG tablet Take 1 tablet (4 mg total) by mouth every 6 (six) hours. 20 tablet 0  . oxyCODONE-acetaminophen (PERCOCET) 10-325 MG per tablet Take 1 tablet every 4 hours for severe pain 60 tablet 0  . promethazine (PHENERGAN) 25 MG tablet Take 25 mg by mouth every 6 (six) hours as needed for nausea.    . SUMAtriptan (IMITREX) 25 MG tablet Take 1  tablet (25 mg total) by mouth once. May repeat in 2 hours if headache persists or recurs. Not to exceed 2 doses in 24 hours 10 tablet 0   Current Facility-Administered Medications on File Prior to Visit  Medication Dose Route Frequency Provider Last Rate Last Dose  . ondansetron (ZOFRAN) injection 4 mg  4 mg Intravenous Once Grayce SessionsMichelle P Edwards, NP        ALLERGIES: Allergies  Allergen Reactions  . Cephalosporins     Possible family reaction  . Citrus Other (See Comments)    Severity varies/ Reaction varies hives around mouth to swelling around mouth and lips.   . Morphine And Related Hives    Patient states she can take IV dilaudid with benadryl     FAMILY HISTORY: Family History  Problem Relation Age of Onset  . Diabetes Mother   . Hypertension Mother   . Diabetes Father   . Hypertension Father   . Cancer Maternal Aunt   . Sickle cell anemia Sister     SOCIAL HISTORY: History   Social History  . Marital Status: Single    Spouse Name: N/A  . Number of Children: N/A  . Years of Education: N/A   Occupational History  . Not on file.   Social History Main Topics  . Smoking status: Never Smoker   . Smokeless tobacco: Never Used  . Alcohol Use: No  . Drug Use: No  . Sexual Activity:    Partners: Male    Birth Control/ Protection: Condom   Other Topics Concern  . Not on file   Social History Narrative    REVIEW OF SYSTEMS: Constitutional: No fevers, chills, or sweats, no generalized fatigue, change in appetite Eyes: No visual changes, double vision, eye pain Ear, nose and throat: No hearing loss, ear pain, nasal congestion, sore throat Cardiovascular: No chest pain, palpitations Respiratory:  No shortness of breath at rest or with exertion, wheezes GastrointestinaI: No nausea, vomiting, diarrhea, abdominal pain, fecal incontinence Genitourinary:  No dysuria, urinary retention or frequency Musculoskeletal:  No neck pain, back pain Integumentary: No rash, pruritus, skin lesions Neurological: as above Psychiatric: No depression, insomnia, anxiety Endocrine: No palpitations, fatigue, diaphoresis, mood swings, change in appetite, change in weight, increased thirst Hematologic/Lymphatic:  No anemia, purpura, petechiae. Allergic/Immunologic: no itchy/runny eyes, nasal congestion, recent allergic reactions, rashes  PHYSICAL EXAM: Filed Vitals:   01/17/15 1025  BP: 130/70  Pulse: 100  Temp: 98.2 F (36.8 C)  Resp: 20   General: No acute distress Head:  Normocephalic/atraumatic Eyes:  fundi unremarkable, without vessel changes, exudates, hemorrhages or papilledema. Neck: supple, no paraspinal tenderness,  full range of motion Back: No paraspinal tenderness Heart: regular rate and rhythm Lungs: Clear to auscultation bilaterally. Vascular: No carotid bruits. Neurological Exam: Mental status: alert and oriented to person, place, and time, recent and remote memory intact, fund of knowledge intact, attention and concentration intact, speech fluent and not dysarthric, language intact. Cranial nerves: CN I: not tested CN II: pupils equal, round and reactive to light, mildly reduced vision in the nasal aspect of the right eye, fundi unremarkable, without vessel changes, exudates, hemorrhages or papilledema. CN III, IV, VI:  full range of motion, no nystagmus, no ptosis CN V: facial sensation intact CN VII: upper and lower face symmetric CN VIII: hearing intact CN IX, X: gag intact, uvula midline CN XI: sternocleidomastoid and trapezius muscles intact CN XII: tongue midline Bulk & Tone: normal, no fasciculations. Motor:  5/5 throughout Sensation:  Temperature  and vibration intact Deep Tendon Reflexes:  2+ throughout, toes downgoing Finger to nose testing:  No dysmetria Heel to shin:  No dysmetria Gait:  Normal station and stride.  Able to turn and walk in tandem. Romberg negative.  IMPRESSION: Right unilateral headache.  Semiology suggests chronic paroxysmal hemicrania Sickle cell disease  PLAN: 1.  Will start topamax and titrate to  twice daily.  She will call in 4 weeks with update.  Another option, if needed, would be to try indomethacin. 2.  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 3.  Follow up in 3 months.  Thank you for allowing me to take part in the care of this patient.  Shon Millet, DO  CC:  Julianne Handler, FNP  Marthann Schiller, MD

## 2015-01-22 ENCOUNTER — Ambulatory Visit (HOSPITAL_COMMUNITY)
Admission: RE | Admit: 2015-01-22 | Discharge: 2015-01-22 | Disposition: A | Discharge status: Home / Self Care | Payer: Medicaid Other | Source: Ambulatory Visit | Attending: Neurology | Admitting: Neurology

## 2015-01-22 DIAGNOSIS — D571 Sickle-cell disease without crisis: Secondary | ICD-10-CM

## 2015-01-22 DIAGNOSIS — R51 Headache: Secondary | ICD-10-CM | POA: Insufficient documentation

## 2015-01-22 DIAGNOSIS — G44049 Chronic paroxysmal hemicrania, not intractable: Secondary | ICD-10-CM

## 2015-01-24 ENCOUNTER — Telehealth: Payer: Self-pay | Admitting: Neurology

## 2015-01-24 NOTE — Telephone Encounter (Signed)
Patient is aware of normal MRI and MRA 

## 2015-01-24 NOTE — Telephone Encounter (Signed)
Pt states that someone called her around 5 and did not leave a message. She did not know if it was about her MRI please call her at [phone]

## 2015-01-25 ENCOUNTER — Telehealth: Payer: Self-pay | Admitting: *Deleted

## 2015-01-25 NOTE — Telephone Encounter (Signed)
-----   Message from Drema DallasAdam R Jaffe, DO sent at 01/23/2015  6:33 AM EDT ----- There is nothing concerning on MRI and MRA of head ----- Message -----    From: Rad Results In Interface    Sent: 01/22/2015  10:03 PM      To: Drema DallasAdam R Jaffe, DO

## 2015-01-25 NOTE — Telephone Encounter (Signed)
Patient is aware that MRI andMRA are normal

## 2015-02-13 ENCOUNTER — Telehealth: Payer: Self-pay | Admitting: Internal Medicine

## 2015-02-13 DIAGNOSIS — D571 Sickle-cell disease without crisis: Secondary | ICD-10-CM

## 2015-02-13 MED ORDER — OXYCODONE-ACETAMINOPHEN 10-325 MG PO TABS: ORAL_TABLET | ORAL | Status: AC

## 2015-02-13 NOTE — Telephone Encounter (Signed)
Refill request for Percocet 10/325mg . LOV 11/23/2014. Please advise. Thanks!

## 2015-02-13 NOTE — Telephone Encounter (Signed)
Meds ordered this encounter  Medications  . oxyCODONE-acetaminophen (PERCOCET) 10-325 MG per tablet    Sig: Take 1 tablet every 4 hours for severe pain    Dispense:  60 tablet    Refill:  0    Order Specific Question:  Supervising Provider    Answer:  Marthann SchillerMATTHEWS, MICHELLE A [[id number]]  Reviewed [place] [employer] Reporting system prior to reorder   Massie MaroonHollis,Emeline Simpson M, FNP

## 2015-03-27 ENCOUNTER — Telehealth (HOSPITAL_COMMUNITY): Payer: Self-pay | Admitting: Hematology

## 2015-03-27 NOTE — Telephone Encounter (Signed)
Spoke with Dr. Ashley RoyaltyMatthews.  Let message on patient's voicemail and advised patient that it is ok to come for fluids on Thursday.  I advised patient to increase her water intake if possible and continue taking her pain medications as prescribed.

## 2015-03-27 NOTE — Telephone Encounter (Signed)
Patient C/O pain to lower back 5/10 on pain scale.  Patient states she wants to come in for fluids but is only available on Thursday morning because of her work schedule.  Patient states she has been taking her percocet q6hrs as prescribed.  I advised I would notify the physician and give her a call back.  Patient verbalizes understanding.

## 2015-03-28 ENCOUNTER — Encounter: Payer: Self-pay | Admitting: Internal Medicine

## 2015-03-28 ENCOUNTER — Telehealth (HOSPITAL_COMMUNITY): Payer: Self-pay

## 2015-03-28 ENCOUNTER — Other Ambulatory Visit: Payer: Self-pay | Admitting: Internal Medicine

## 2015-03-28 ENCOUNTER — Encounter (HOSPITAL_COMMUNITY): Payer: Self-pay

## 2015-03-28 ENCOUNTER — Non-Acute Institutional Stay (HOSPITAL_COMMUNITY)
Admission: AD | Admit: 2015-03-28 | Discharge: 2015-03-28 | Disposition: A | Discharge status: Home / Self Care | Payer: Medicaid Other | Attending: Internal Medicine | Admitting: Internal Medicine

## 2015-03-28 DIAGNOSIS — R17 Unspecified jaundice: Secondary | ICD-10-CM | POA: Diagnosis not present

## 2015-03-28 DIAGNOSIS — Z8673 Personal history of transient ischemic attack (TIA), and cerebral infarction without residual deficits: Secondary | ICD-10-CM | POA: Diagnosis not present

## 2015-03-28 DIAGNOSIS — Z832 Family history of diseases of the blood and blood-forming organs and certain disorders involving the immune mechanism: Secondary | ICD-10-CM | POA: Insufficient documentation

## 2015-03-28 DIAGNOSIS — E876 Hypokalemia: Secondary | ICD-10-CM

## 2015-03-28 DIAGNOSIS — Z79899 Other long term (current) drug therapy: Secondary | ICD-10-CM | POA: Insufficient documentation

## 2015-03-28 DIAGNOSIS — E872 Acidosis, unspecified: Secondary | ICD-10-CM

## 2015-03-28 DIAGNOSIS — J45909 Unspecified asthma, uncomplicated: Secondary | ICD-10-CM | POA: Diagnosis not present

## 2015-03-28 DIAGNOSIS — D5700 Hb-SS disease with crisis, unspecified: Secondary | ICD-10-CM | POA: Diagnosis present

## 2015-03-28 DIAGNOSIS — Z79891 Long term (current) use of opiate analgesic: Secondary | ICD-10-CM | POA: Diagnosis not present

## 2015-03-28 DIAGNOSIS — Z791 Long term (current) use of non-steroidal anti-inflammatories (NSAID): Secondary | ICD-10-CM | POA: Diagnosis not present

## 2015-03-28 DIAGNOSIS — M549 Dorsalgia, unspecified: Secondary | ICD-10-CM | POA: Diagnosis present

## 2015-03-28 DIAGNOSIS — Z5181 Encounter for therapeutic drug level monitoring: Secondary | ICD-10-CM

## 2015-03-28 LAB — CBC WITH DIFFERENTIAL/PLATELET
BASOS PCT: 3 % — AB (ref 0–1)
Basophils Absolute: 0.3 10*3/uL — ABNORMAL HIGH (ref 0.0–0.1)
Eosinophils Absolute: 0.2 10*3/uL (ref 0.0–0.7)
Eosinophils Relative: 2 % (ref 0–5)
HEMATOCRIT: 18.5 % — AB (ref 36.0–46.0)
HEMOGLOBIN: 6.7 g/dL — AB (ref 12.0–15.0)
LYMPHS ABS: 1.6 10*3/uL (ref 0.7–4.0)
LYMPHS PCT: 19 % (ref 12–46)
MCH: 37.6 pg — ABNORMAL HIGH (ref 26.0–34.0)
MCHC: 36.2 g/dL — ABNORMAL HIGH (ref 30.0–36.0)
MCV: 103.9 fL — ABNORMAL HIGH (ref 78.0–100.0)
MONO ABS: 1 10*3/uL (ref 0.1–1.0)
Monocytes Relative: 12 % (ref 3–12)
NEUTROS PCT: 64 % (ref 43–77)
Neutro Abs: 5.5 10*3/uL (ref 1.7–7.7)
Platelets: 169 10*3/uL (ref 150–400)
RBC: 1.78 MIL/uL — ABNORMAL LOW (ref 3.87–5.11)
RDW: 22.5 % — ABNORMAL HIGH (ref 11.5–15.5)
WBC: 8.6 10*3/uL (ref 4.0–10.5)
nRBC: 16 /100 WBC — ABNORMAL HIGH

## 2015-03-28 LAB — BASIC METABOLIC PANEL
Anion gap: 8 (ref 5–15)
BUN: 12 mg/dL (ref 6–20)
CO2: 18 mmol/L — ABNORMAL LOW (ref 22–32)
Calcium: 8.9 mg/dL (ref 8.9–10.3)
Chloride: 111 mmol/L (ref 101–111)
Creatinine, Ser: 0.62 mg/dL (ref 0.44–1.00)
Glucose, Bld: 87 mg/dL (ref 65–99)
Potassium: 3.4 mmol/L — ABNORMAL LOW (ref 3.5–5.1)
SODIUM: 137 mmol/L (ref 135–145)

## 2015-03-28 LAB — LACTATE DEHYDROGENASE: LDH: 680 U/L — AB (ref 98–192)

## 2015-03-28 LAB — RETICULOCYTES
RBC.: 1.78 MIL/uL — ABNORMAL LOW (ref 3.87–5.11)
RETIC CT PCT: 22.8 % — AB (ref 0.4–3.1)
Retic Count, Absolute: 405.8 10*3/uL — ABNORMAL HIGH (ref 19.0–186.0)

## 2015-03-28 MED ORDER — ONDANSETRON HCL 4 MG/2ML IJ SOLN
4.0000 mg | Freq: Four times a day (QID) | INTRAMUSCULAR | Status: DC | PRN
  Administered 2015-03-28: 4 mg via INTRAVENOUS
  Filled 2015-03-28: qty 2

## 2015-03-28 MED ORDER — DIPHENHYDRAMINE HCL 25 MG PO CAPS
25.0000 mg | ORAL_CAPSULE | Freq: Four times a day (QID) | ORAL | Status: DC | PRN
  Administered 2015-03-28: 50 mg via ORAL
  Filled 2015-03-28: qty 2

## 2015-03-28 MED ORDER — DEXTROSE-NACL 5-0.45 % IV SOLN
INTRAVENOUS | Status: DC
  Administered 2015-03-28: 09:00:00 via INTRAVENOUS

## 2015-03-28 MED ORDER — OXYCODONE HCL 5 MG PO TABS
5.0000 mg | ORAL_TABLET | Freq: Once | ORAL | Status: AC
Start: 2015-03-28 — End: 2015-03-28
  Administered 2015-03-28: 5 mg via ORAL
  Filled 2015-03-28: qty 1

## 2015-03-28 MED ORDER — OXYCODONE-ACETAMINOPHEN 5-325 MG PO TABS
1.0000 | ORAL_TABLET | Freq: Once | ORAL | Status: AC
  Administered 2015-03-28: 1 via ORAL
  Filled 2015-03-28: qty 1

## 2015-03-28 MED ORDER — ONDANSETRON 4 MG PO TBDP
4.0000 mg | ORAL_TABLET | Freq: Four times a day (QID) | ORAL | Status: DC | PRN
  Filled 2015-03-28: qty 1

## 2015-03-28 MED ORDER — HYDROMORPHONE HCL 2 MG/ML IJ SOLN
1.0000 mg | Freq: Once | INTRAMUSCULAR | Status: AC
  Administered 2015-03-28: 1 mg via INTRAVENOUS
  Filled 2015-03-28: qty 1

## 2015-03-28 NOTE — Progress Notes (Signed)
After pain medication was reassessed, patient rates pain 3/10. Upon ambulation, patient states her pain increased slightly. Will continue to monitor as patient declines the need for further pain medication at this time.

## 2015-03-28 NOTE — Telephone Encounter (Signed)
Patient called complaining of lower back pain. Rates 7/10. She has some nausea, denies diarrhea. She has been taken percocet for pain every 6 hiurs

## 2015-03-28 NOTE — Progress Notes (Signed)
Patient treated at day hospital today. IV accessed, fluids administered, pain and nausea medication given. IV removed. Discharge instructions given. Patient alert oriented and ambulatory at discharge. Rates pain 2/10. Pain goal was 3/10.

## 2015-03-28 NOTE — Discharge Instructions (Signed)
Sickle Cell Anemia, Adult °Sickle cell anemia is a condition in which red blood cells have an abnormal "sickle" shape. This abnormal shape shortens the cells' life span, which results in a lower than normal concentration of red blood cells in the blood. The sickle shape also causes the cells to clump together and block free blood flow through the blood vessels. As a result, the tissues and organs of the body do not receive enough oxygen. Sickle cell anemia causes organ damage and pain and increases the risk of infection. °CAUSES  °Sickle cell anemia is a genetic disorder. Those who receive two copies of the gene have the condition, and those who receive one copy have the trait. °RISK FACTORS °The sickle cell gene is most common in people whose families originated in [place]. Other areas of the globe where sickle cell trait occurs include the Mediterranean, [place] and [place], the [place], and the [place].  °SIGNS AND SYMPTOMS °· Pain, especially in the extremities, back, chest, or abdomen (common). The pain may start suddenly or may develop following an illness, especially if there is dehydration. Pain can also occur due to overexertion or exposure to extreme temperature changes. °· Frequent severe bacterial infections, especially certain types of pneumonia and meningitis. °· Pain and swelling in the hands and feet. °· Decreased activity.   °· Loss of appetite.   °· Change in behavior. °· Headaches. °· Seizures. °· Shortness of breath or difficulty breathing. °· Vision changes. °· Skin ulcers. °Those with the trait may not have symptoms or they may have mild symptoms.  °DIAGNOSIS  °Sickle cell anemia is diagnosed with blood tests that demonstrate the genetic trait. It is often diagnosed during the newborn period, due to mandatory testing nationwide. A variety of blood tests, X-rays, CT scans, MRI scans, ultrasounds, and lung function tests may also be done to monitor the condition. °TREATMENT  °Sickle  cell anemia may be treated with: °· Medicines. You may be given pain medicines, antibiotic medicines (to treat and prevent infections) or medicines to increase the production of certain types of hemoglobin. °· Fluids. °· Oxygen. °· Blood transfusions. °HOME CARE INSTRUCTIONS  °· Drink enough fluid to keep your urine clear or pale yellow. Increase your fluid intake in hot weather and during exercise. °· Do not smoke. Smoking lowers oxygen levels in the blood.   °· Only take over-the-counter or prescription medicines for pain, fever, or discomfort as directed by your health care provider. °· Take antibiotics as directed by your health care provider. Make sure you finish them it even if you start to feel better.   °· Take supplements as directed by your health care provider.   °· Consider wearing a medical alert bracelet. This tells anyone caring for you in an emergency of your condition.   °· When traveling, keep your medical information, health care provider's names, and the medicines you take with you at all times.   °· If you develop a fever, do not take medicines to reduce the fever right away. This could cover up a problem that is developing. Notify your health care provider. °· Keep all follow-up appointments with your health care provider. Sickle cell anemia requires regular medical care. °SEEK MEDICAL CARE IF: ° You have a fever. °SEEK IMMEDIATE MEDICAL CARE IF:  °· You feel dizzy or faint.   °· You have new abdominal pain, especially on the left side near the stomach area.   °· You develop a persistent, often uncomfortable and painful penile erection (priapism). If this is not treated immediately it   will lead to impotence.   °· You have numbness your arms or legs or you have a hard time moving them.   °· You have a hard time with speech.   °· You have a fever or persistent symptoms for more than 2-3 days.   °· You have a fever and your symptoms suddenly get worse.   °· You have signs or symptoms of infection.  These include:   °¨ Chills.   °¨ Abnormal tiredness (lethargy).   °¨ Irritability.   °¨ Poor eating.   °¨ Vomiting.   °· You develop pain that is not helped with medicine.   °· You develop shortness of breath. °· You have pain in your chest.   °· You are coughing up pus-like or bloody sputum.   °· You develop a stiff neck. °· Your feet or hands swell or have pain. °· Your abdomen appears bloated. °· You develop joint pain. °MAKE SURE YOU: °· Understand these instructions. °Document Released: 01/14/2006 Document Revised: 02/20/2014 Document Reviewed: 05/18/2013 °[employer]® Patient Information ©2015 [employer]. This information is not intended to replace advice given to you by your health care provider. Make sure you discuss any questions you have with your health care provider. ° °

## 2015-03-28 NOTE — Discharge Summary (Signed)
Physician Discharge Summary  Joan Martin UJW:[id number] DOB: 05/16/1986 DOA: 03/28/2015  PCP: Dail Lerew A., MD  Admit date: 03/28/2015 Discharge date: 03/28/2015  Discharge Diagnoses:  Active Problems:   Hb-SS disease with crisis   Discharge Condition: Goal achieved. Pt discharged home in good condition.  Disposition:  Follow-up Information    Follow up with Hena Ewalt A., MD.   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 She currently rates pain at 7/10. She states that she was much more jaundiced than today in the past several days. She has been increasing her fluids and managing with oer oral analgesics. However last night it escalated to 10/10 and patient endured pain [facility] She is skeptical about use of IV opiods and wants to try hydration first.    Hospital Course:  Pt was treated with initially treated with IVF and Toradol . The pain remained above 5/10 and patient received 1 mg of IV Dilaudid followed by Percocet 10-325 mg x 1. She was re-assessed one hour later and the pain had reached a goal level of 3/10. Pt was able to ambulate and eat without any difficulty. She was discharged to home in good condition. 3/10  Discharge Exam:  Filed Vitals:   03/28/15 1254  BP: 109/65  Pulse: 75  Temp: 97.8 F (36.6 C)  Resp: 18   Filed Vitals:   03/28/15 0859 03/28/15 1053 03/28/15 1254  BP: 107/68 112/59 109/65  Pulse: 86 72 75  Temp: 98.2 F (36.8 C) 97.9 F (36.6 C) 97.8 F (36.6 C)  TempSrc: Oral  Oral Oral  Resp: 18 18 18   Height: 5\' 7"  (1.702 m)    Weight: 135 lb (61.236 kg)    SpO2: 100% 100% 100%     General: Alert, awake, oriented x3, in no acute distress.  HEENT: [place]/AT PEERL, EOMI, mild icterus Neck: Trachea midline,  no masses, no thyromegal,y no JVD, no carotid bruit OROPHARYNX:  Moist, No exudate/ erythema/lesions.  Heart: Regular rate and rhythm, without murmurs, rubs, gallops.  Lungs: Clear to auscultation, no wheezing or rhonchi noted. No increased vocal fremitus resonant to percussion  Abdomen: Soft, nontender, nondistended, positive bowel sounds, no masses no hepatosplenomegaly noted.  Neuro: No focal neurological deficits noted cranial nerves II through XII grossly intact. Strength normal in bilateral upper and lower extremities. Musculoskeletal: No warm swelling or erythema around joints, no spinal tenderness noted. Psychiatric: Patient alert and oriented x3, good insight and cognition, good recent to remote recall.    Discharge Instructions  Discharge Instructions    Activity as tolerated - No restrictions    Complete by:  As directed      Diet general    Complete by:  As directed             Medication List    STOP taking these medications        amoxicillin 500 MG capsule  Commonly known as:  AMOXIL  TAKE these medications        albuterol (2.5 MG/3ML) 0.083% nebulizer solution  Commonly known as:  PROVENTIL  Take 3 mLs (2.5 mg total) by nebulization every 6 (six) hours as needed for shortness of breath.     diphenhydrAMINE 25 MG tablet  Commonly known as:  BENADRYL  Take 25 mg by mouth every 6 (six) hours as needed for itching.     ergocalciferol 50000 UNITS capsule  Commonly known as:  VITAMIN D2  Take 1 capsule (50,000 Units total) by mouth once a week.     Fluticasone-Salmeterol 250-50 MCG/DOSE Aepb  Commonly known as:  ADVAIR  Inhale 1 puff into the lungs daily.     folic acid 1 MG tablet  Commonly known as:  FOLVITE  Take  1 tablet (1 mg total) by mouth daily.     hydroxyurea 500 MG capsule  Commonly known as:  HYDREA  TAKE ONE CAPSULE BY MOUTH TWICE DAILY     ibuprofen 600 MG tablet  Commonly known as:  ADVIL,MOTRIN  Take 1 tablet (600 mg total) by mouth every 8 (eight) hours as needed for pain.     mometasone 50 MCG/ACT nasal spray  Commonly known as:  NASONEX  Place 2 sprays into the nose daily.     ondansetron 4 MG tablet  Commonly known as:  ZOFRAN  Take 1 tablet (4 mg total) by mouth every 6 (six) hours.     oxyCODONE-acetaminophen 10-325 MG per tablet  Commonly known as:  PERCOCET  Take 1 tablet every 4 hours for severe pain     promethazine 25 MG tablet  Commonly known as:  PHENERGAN  Take 25 mg by mouth every 6 (six) hours as needed for nausea.     SUMAtriptan 25 MG tablet  Commonly known as:  IMITREX  Take 1 tablet (25 mg total) by mouth once. May repeat in 2 hours if headache persists or recurs. Not to exceed 2 doses in 24 hours     topiramate 50 MG tablet  Commonly known as:  TOPAMAX  Take 0.5tab BID x7d, then 1tab BID.          The results of significant diagnostics from this hospitalization (including imaging, microbiology, ancillary and laboratory) are listed below for reference.    Significant Diagnostic Studies: No results found.  Microbiology: No results found for this or any previous visit (from the past 240 hour(s)).   Labs: Basic Metabolic Panel:  Recent Labs Lab 03/28/15 0950  NA 137  K 3.4*  CL 111  CO2 18*  GLUCOSE 87  BUN 12  CREATININE 0.62  CALCIUM 8.9   Liver Function Tests: No results for input(s): AST, ALT, ALKPHOS, BILITOT, PROT, ALBUMIN in the last 168 hours. No results for input(s): LIPASE, AMYLASE in the last 168 hours. No results for input(s): AMMONIA in the last 168 hours. CBC:  Recent Labs Lab 03/28/15 0950  WBC 8.6  NEUTROABS 5.5  HGB 6.7*  HCT 18.5*  MCV 103.9*  PLT 169   Cardiac Enzymes: No results for input(s):  CKTOTAL, CKMB, CKMBINDEX, TROPONINI in the last 168 hours. BNP: Invalid input(s): POCBNP CBG: No results for input(s): GLUCAP in the last 168 hours. Ferritin: No results for input(s): FERRITIN in the last 168 hours.  Time coordinating discharge: 35 minutes  Signed:  Nelle Sayed A.  03/28/2015, 4:29 PM

## 2015-03-28 NOTE — Progress Notes (Addendum)
CRITICAL VALUE ALERT  Critical value received:  6.7 Date of notification:  03/28/2015  Time of notification:  1012  Critical value read back:  yes  Nurse who received alert:  Merlene Morseeborah Cyriah Childrey RN  MD [facility] acknowledged verbally

## 2015-03-28 NOTE — H&P (Signed)
[facility] History and Physical  Joan Martin EAV:[id number] DOB: [birth year] DOA: 03/28/2015   PCP: Arienna Benegas A., MD   Chief Complaint: Pain in back and legs x several days  NWG:NFAOZH naive patient with Hb SS presents to day hospital with c/o pain in back and legs which she describes as sharp and throbbing. She currently rates pain at 7/10. She states that she was much more jaundiced than today in the past several days. She has been increasing her fluids and managing with oer oral analgesics. However last night it escalated to 10/10 and patient endured pain until she could get to the [facility] today. She is skeptical about use of IV opiods and wants to try hydration first.    Review of Systems:  Constitutional: No weight loss, night sweats, Fevers, chills, fatigue.  HEENT: No headaches, dizziness, seizures, vision changes, difficulty swallowing,Tooth/dental problems,Sore throat, No sneezing, itching, ear ache, nasal congestion, post nasal drip,  Cardio-vascular: No chest pain, Orthopnea, PND, swelling in lower extremities, anasarca, dizziness, palpitations  GI: No heartburn, indigestion, abdominal pain, nausea, vomiting, diarrhea, change in bowel habits, loss of appetite  Resp: No shortness of breath with exertion or at rest. No excess mucus, no productive cough, No non-productive cough, No coughing up of blood.No change in color of mucus.No wheezing.No chest wall deformity  Skin: no rash or lesions.  GU: no dysuria, change in color of urine, no urgency or frequency. No flank pain.  Psych: No change in mood or affect. No depression or anxiety. No memory loss.    Past Medical History  Diagnosis Date  . Sickle cell anemia   . Stroke     7 years ago  . Sickle cell anemia   . Asthma    Past Surgical History  Procedure Laterality Date  . Cholecystectomy     Social History:  reports that she has never smoked. She has never used smokeless tobacco. She reports  that she does not drink alcohol or use illicit drugs.  Allergies  Allergen Reactions  . Cephalosporins     Possible family reaction  . Citrus Other (See Comments)    Severity varies/ Reaction varies hives around mouth to swelling around mouth and lips.   . Morphine And Related Hives    Patient states she can take IV dilaudid with benadryl    Family History  Problem Relation Age of Onset  . Diabetes Mother   . Hypertension Mother   . Diabetes Father   . Hypertension Father   . Cancer Maternal Aunt   . Sickle cell anemia Sister     Prior to Admission medications   Medication Sig Start Date End Date Taking? Authorizing Provider  albuterol (PROVENTIL) (2.5 MG/3ML) 0.083% nebulizer solution Take 3 mLs (2.5 mg total) by nebulization every 6 (six) hours as needed for shortness of breath. 12/01/13  Yes Massie Maroon, FNP  diphenhydrAMINE (BENADRYL) 25 MG tablet Take 25 mg by mouth every 6 (six) hours as needed for itching.   Yes Historical Provider, MD  ergocalciferol (VITAMIN D2) 50000 UNITS capsule Take 1 capsule (50,000 Units total) by mouth once a week. 11/23/14  Yes Massie Maroon, FNP  Fluticasone-Salmeterol (ADVAIR) 250-50 MCG/DOSE AEPB Inhale 1 puff into the lungs daily.   Yes Historical Provider, MD  folic acid (FOLVITE) 1 MG tablet Take 1 tablet (1 mg total) by mouth daily. 01/03/14  Yes Massie Maroon, FNP  hydroxyurea (HYDREA) 500 MG capsule TAKE ONE CAPSULE BY MOUTH TWICE DAILY  Yes Altha HarmMichelle A Koya Hunger, MD  ibuprofen (ADVIL,MOTRIN) 600 MG tablet Take 1 tablet (600 mg total) by mouth every 8 (eight) hours as needed for pain. 07/27/13  Yes Grayce SessionsMichelle P Edwards, NP  mometasone (NASONEX) 50 MCG/ACT nasal spray Place 2 sprays into the nose daily. 11/23/14  Yes Massie MaroonLachina M Hollis, FNP  ondansetron (ZOFRAN) 4 MG tablet Take 1 tablet (4 mg total) by mouth every 6 (six) hours. 10/19/14  Yes Massie MaroonLachina M Hollis, FNP  oxyCODONE-acetaminophen (PERCOCET) 10-325 MG per tablet Take 1 tablet every 4  hours for severe pain 02/13/15  Yes Massie MaroonLachina M Hollis, FNP  topiramate (TOPAMAX) 50 MG tablet Take 0.5tab BID x7d, then 1tab BID. 01/17/15  Yes Drema DallasAdam R Jaffe, DO  amoxicillin (AMOXIL) 500 MG capsule Take 1 capsule (500 mg total) by mouth 2 (two) times daily. Patient not taking: Reported on 01/17/2015 12/28/14   Rodolph BongEvan S Corey, MD  promethazine (PHENERGAN) 25 MG tablet Take 25 mg by mouth every 6 (six) hours as needed for nausea.    Historical Provider, MD  SUMAtriptan (IMITREX) 25 MG tablet Take 1 tablet (25 mg total) by mouth once. May repeat in 2 hours if headache persists or recurs. Not to exceed 2 doses in 24 hours 10/19/14   Massie MaroonLachina M Hollis, FNP   Physical Exam: Filed Vitals:   03/28/15 0859  BP: 107/68  Pulse: 86  Temp: 98.2 F (36.8 C)  TempSrc: Oral  Resp: 18  Height: 5\' 7"  (1.702 m)  Weight: 135 lb (61.236 kg)  SpO2: 100%   General: Alert, awake, oriented x3, in no acute distress.  HEENT: [place]/AT PEERL, EOMI Neck: Trachea midline,  no masses, no thyromegal,y no JVD, no carotid bruit OROPHARYNX:  Moist, No exudate/ erythema/lesions.  Heart: Regular rate and rhythm, without murmurs, rubs, gallops, PMI non-displaced, no heaves or thrills on palpation.  Lungs: Clear to auscultation, no wheezing or rhonchi noted. No increased vocal fremitus resonant to percussion  Abdomen: Soft, nontender, nondistended, positive bowel sounds, no masses no hepatosplenomegaly noted..  Neuro: No focal neurological deficits noted cranial nerves II through XII grossly intact.   Labs on Admission:       Assessment/Plan: Active Problems: Hb SS with Crisis: Pt will be treated with initially treated with IVF and Toradol . If pain is above 5/10 then we will try 1 mg of IV Dilaudid and re-assess . Patient will be re-evaluated for pain in the context of function and relationship to baseline as care progresses. Her goal is pain at a level of 3/10   Time spend: 35 minutes Code Status: Full Code Family  Communication: N/A Disposition Plan: Home at discharge  Mosi Hannold A., MD  Pager [phone]  If 7PM-7AM, please contact night-coverage [email or web address] Password [email or web address] 03/28/2015, 9:06 AM

## 2015-03-28 NOTE — Progress Notes (Signed)
Hb marginal at 6.7 g/dL. Pt to come in on MOnday or Tuesday to have CBC with diff and reticulocytes. Also patientr mildly hypokalemic and with mild metabolic acidemia. Will check BMET at the same time.

## 2015-03-28 NOTE — Telephone Encounter (Signed)
After speaking with MD, Called Patient and insructed her to come to the day hospital. Patient report understanding and states she will be here in approximately 15 minutes.

## 2015-04-03 ENCOUNTER — Non-Acute Institutional Stay (HOSPITAL_COMMUNITY)
Admission: RE | Admit: 2015-04-03 | Discharge: 2015-04-03 | Disposition: A | Discharge status: Home / Self Care | Payer: Medicaid Other | Source: Ambulatory Visit | Attending: Internal Medicine | Admitting: Internal Medicine

## 2015-04-03 ENCOUNTER — Encounter: Payer: Self-pay | Admitting: Internal Medicine

## 2015-04-03 ENCOUNTER — Ambulatory Visit (INDEPENDENT_AMBULATORY_CARE_PROVIDER_SITE_OTHER): Payer: Medicaid Other | Admitting: Internal Medicine

## 2015-04-03 ENCOUNTER — Ambulatory Visit (HOSPITAL_COMMUNITY)
Admission: RE | Admit: 2015-04-03 | Discharge: 2015-04-03 | Disposition: A | Discharge status: Home / Self Care | Payer: Medicaid Other | Source: Ambulatory Visit | Attending: Internal Medicine | Admitting: Internal Medicine

## 2015-04-03 VITALS — Temp 101.0°F

## 2015-04-03 VITALS — BP 128/72 | HR 100 | Temp 101.0°F | Resp 20

## 2015-04-03 DIAGNOSIS — E872 Acidosis, unspecified: Secondary | ICD-10-CM

## 2015-04-03 DIAGNOSIS — R509 Fever, unspecified: Secondary | ICD-10-CM | POA: Diagnosis not present

## 2015-04-03 DIAGNOSIS — E876 Hypokalemia: Secondary | ICD-10-CM | POA: Diagnosis not present

## 2015-04-03 DIAGNOSIS — D5700 Hb-SS disease with crisis, unspecified: Secondary | ICD-10-CM | POA: Insufficient documentation

## 2015-04-03 DIAGNOSIS — I517 Cardiomegaly: Secondary | ICD-10-CM | POA: Insufficient documentation

## 2015-04-03 DIAGNOSIS — Z5181 Encounter for therapeutic drug level monitoring: Secondary | ICD-10-CM

## 2015-04-03 DIAGNOSIS — D599 Acquired hemolytic anemia, unspecified: Secondary | ICD-10-CM

## 2015-04-03 LAB — BASIC METABOLIC PANEL
BUN: 11 mg/dL (ref 6–23)
CALCIUM: 8.4 mg/dL (ref 8.4–10.5)
CHLORIDE: 104 meq/L (ref 96–112)
CO2: 17 mEq/L — ABNORMAL LOW (ref 19–32)
Creat: 0.85 mg/dL (ref 0.50–1.10)
Glucose, Bld: 97 mg/dL (ref 70–99)
Potassium: 3.7 mEq/L (ref 3.5–5.3)
SODIUM: 130 meq/L — AB (ref 135–145)

## 2015-04-03 LAB — POCT URINALYSIS DIP (DEVICE)
GLUCOSE, UA: 100 mg/dL — AB
Ketones, ur: NEGATIVE mg/dL
Leukocytes, UA: NEGATIVE
NITRITE: NEGATIVE
PH: 6 (ref 5.0–8.0)
Protein, ur: 100 mg/dL — AB
Specific Gravity, Urine: 1.01 (ref 1.005–1.030)
Urobilinogen, UA: >=8 mg/dL (ref 0.0–1.0)

## 2015-04-03 LAB — PREPARE RBC (CROSSMATCH)

## 2015-04-03 MED ORDER — SODIUM CHLORIDE 0.9 % IV BOLUS (SEPSIS)
1000.0000 mL | Freq: Once | INTRAVENOUS | Status: AC
  Administered 2015-04-03: 1000 mL via INTRAVENOUS

## 2015-04-03 MED ORDER — SODIUM CHLORIDE 0.9 % IV SOLN: Freq: Once | INTRAVENOUS | Status: DC

## 2015-04-03 NOTE — Procedures (Signed)
[facility] Day [facility]  Procedure Note  Joan Martin UXN:[id number] DOB: 1986-10-12 DOA: 04/03/2015   PCP: MATTHEWS,MICHELLE A., MD   Associated Diagnosis: D57.00, R50.9  Procedure Note: IV started, NS infused per order, type and screen done, patient instructed to keep blood band in place until Friday night at midnight, patient verbalizes understanding.   Condition During Procedure:  No change throughout procedure   Condition at Discharge: patient stable.  Verbalizes understanding to go to radiology for out patient chest xray and to call her PCP if any changes or not feeling better.   Lanae Boast, RN  [facility]

## 2015-04-03 NOTE — Progress Notes (Signed)
Subjective:     Patient ID: Joan Martin, female   DOB: 17-Apr-1986, 29 y.o.   MRN: [id number]  HPI: This is an opiate naive patient with Hb SS who came in today to have labs done to evaluate her Hydrea indices as she was borderline last week.  During the visit she  c/o feeling dry and warm. When evaluated she was found to have a fever of 101. She denies cough, SOB. DOE, dysuria. CP or headache. She does report pain at an intensity of 8/10  Localized to the low back. She describes the pain as throbbing and sharp in nature. She had similar complaints of pain last week and received IV hydration and one dose of IV dilaudid. Pt is very resistant to using opiates for management of her pain and does not want opiates otr hospitalization at this time.    Review of Systems  Constitutional: Positive for fatigue. Negative for chills, activity change and appetite change.  HENT: Negative.  Negative for dental problem, ear pain, hearing loss, sore throat and trouble swallowing.   Eyes: Negative.  Negative for visual disturbance.  Respiratory: Negative.  Negative for cough, chest tightness, shortness of breath and wheezing.   Cardiovascular: Negative.  Negative for chest pain, palpitations and leg swelling.  Gastrointestinal: Negative.  Negative for nausea, vomiting, abdominal pain, diarrhea, constipation, blood in stool, anal bleeding and rectal pain.  Endocrine: Negative.   Genitourinary: Negative.  Negative for dysuria, urgency, frequency, vaginal discharge, difficulty urinating, vaginal pain and dyspareunia.  Musculoskeletal: Positive for back pain.  Skin: Negative.   Allergic/Immunologic: Positive for environmental allergies.  Neurological: Negative.   Hematological: Negative.   Psychiatric/Behavioral: Negative.   All other systems reviewed and are negative.      Objective:   Physical Exam  Constitutional: She is oriented to person, place, and time. She appears well-developed and well-nourished.   HENT:  Head: Normocephalic and atraumatic.  Nose: Nose normal.  Mouth/Throat: No oropharyngeal exudate.  No sinus tenderness noted   Eyes: Conjunctivae and EOM are normal. Pupils are equal, round, and reactive to light. Scleral icterus is present.  Neck: Normal range of motion. Neck supple. No JVD present. No thyromegaly present.  Cardiovascular: Normal rate and regular rhythm.  Exam reveals no gallop and no friction rub.   No murmur heard. Pulmonary/Chest: Effort normal and breath sounds normal. She has no wheezes. She has no rales. She exhibits no tenderness.  Abdominal: Soft. Bowel sounds are normal. She exhibits no distension and no mass. There is no tenderness. There is no rebound and no guarding.  Neurological: She is alert and oriented to person, place, and time. No cranial nerve deficit.  Skin: Skin is warm and dry.  Vitals reviewed.      Assessment:     1. Other specified fever - Pt presents with fever without obvious source. This is likely related to crisis. Her labs have been drawn and will be sent off stat. I will also check a urine and send for reflex microscopy.Will also obtain blood cultures. I will hold off on antibiotics. - Urinalysis, Routine w reflex microscopic (not at [facility]); Standing - Urinalysis, Routine w reflex microscopic (not at [facility]) - DG Chest 2 View; Standing - Culture, blood (single) - DG Chest 2 View  2. Hb-SS disease with crisis - Pt is refusing IV analgesics. Will give IV hydration and hold Hydrea until CBC with diff and reticulocytes are back. - DG Chest 2 View; Standing - Culture, blood (single) - DG  Chest 2 View - Will Type and cross for RBC's in anticipation of need for transfusion.      Plan:     See above.

## 2015-04-04 ENCOUNTER — Other Ambulatory Visit: Payer: Self-pay | Admitting: Internal Medicine

## 2015-04-04 ENCOUNTER — Other Ambulatory Visit: Payer: Self-pay

## 2015-04-04 ENCOUNTER — Telehealth: Payer: Self-pay

## 2015-04-04 ENCOUNTER — Non-Acute Institutional Stay (HOSPITAL_COMMUNITY)
Admission: RE | Admit: 2015-04-04 | Discharge: 2015-04-04 | Disposition: A | Discharge status: Home / Self Care | Payer: Medicaid Other | Source: Ambulatory Visit | Attending: Internal Medicine | Admitting: Internal Medicine

## 2015-04-04 VITALS — BP 104/55 | HR 90 | Temp 99.1°F | Resp 20

## 2015-04-04 DIAGNOSIS — D599 Acquired hemolytic anemia, unspecified: Secondary | ICD-10-CM | POA: Diagnosis not present

## 2015-04-04 DIAGNOSIS — R509 Fever, unspecified: Secondary | ICD-10-CM

## 2015-04-04 DIAGNOSIS — D5700 Hb-SS disease with crisis, unspecified: Secondary | ICD-10-CM | POA: Insufficient documentation

## 2015-04-04 LAB — CBC WITH DIFFERENTIAL/PLATELET
BASOS PCT: 2 % — AB (ref 0–1)
Basophils Absolute: 0.2 10*3/uL — ABNORMAL HIGH (ref 0.0–0.1)
EOS ABS: 0 10*3/uL (ref 0.0–0.7)
EOS PCT: 0 % (ref 0–5)
HCT: 18.8 % — ABNORMAL LOW (ref 36.0–46.0)
HEMOGLOBIN: 6.8 g/dL — AB (ref 12.0–15.0)
LYMPHS ABS: 2.9 10*3/uL (ref 0.7–4.0)
Lymphocytes Relative: 33 % (ref 12–46)
MCH: 37.2 pg — ABNORMAL HIGH (ref 26.0–34.0)
MCHC: 36.2 g/dL — ABNORMAL HIGH (ref 30.0–36.0)
MCV: 102.7 fL — AB (ref 78.0–100.0)
MPV: 11.9 fL (ref 8.6–12.4)
Monocytes Absolute: 0.7 10*3/uL (ref 0.1–1.0)
Monocytes Relative: 8 % (ref 3–12)
Neutro Abs: 5 10*3/uL (ref 1.7–7.7)
Neutrophils Relative %: 57 % (ref 43–77)
Platelets: 148 10*3/uL — ABNORMAL LOW (ref 150–400)
RBC: 1.83 MIL/uL — AB (ref 3.87–5.11)
RDW: 21.8 % — ABNORMAL HIGH (ref 11.5–15.5)
WBC: 8.7 10*3/uL (ref 4.0–10.5)

## 2015-04-04 LAB — RETICULOCYTES
ABS Retic: 302 10*3/uL — ABNORMAL HIGH (ref 19.0–186.0)
RBC.: 1.83 MIL/uL — ABNORMAL LOW (ref 3.87–5.11)
Retic Ct Pct: 16.5 % — ABNORMAL HIGH (ref 0.4–2.3)

## 2015-04-04 MED ORDER — SODIUM CHLORIDE 0.9 % IV SOLN
Freq: Once | INTRAVENOUS | Status: AC
  Administered 2015-04-04: 10:00:00 via INTRAVENOUS

## 2015-04-04 MED ORDER — IBUPROFEN 200 MG PO TABS
600.0000 mg | ORAL_TABLET | Freq: Once | ORAL | Status: AC
  Administered 2015-04-04: 600 mg via ORAL
  Filled 2015-04-04: qty 3

## 2015-04-04 NOTE — Telephone Encounter (Signed)
[facility] called at 8:10am with critical lab result of hgb 6.8. Dr. Ashley Royalty was paged and then called back and I advised Dr. Ashley Royalty of this result at 8:19am via phone. Thanks!

## 2015-04-04 NOTE — Procedures (Signed)
[facility] Day [facility]  Procedure Note  Joan Martin NOM:[id number] DOB: Apr 21, 1986 DOA: 04/04/2015   PCP: MATTHEWS,MICHELLE A., MD   Associated Diagnosis: D57.00, D59.9, R50.9  Procedure Note: IV started, 1 unit PRBC's transfused per order   Condition During Procedure: patient stable throughout procedure   Condition at Discharge: patient stable, ambulatory at discharge.   Lanae Boast, RN  [facility]

## 2015-04-04 NOTE — Progress Notes (Signed)
Patient presented to [facility] for blood transfusion.  Temp noted on arrival to be 100.5.  MD notified. Per doctor, still do transfusion. See new orders.  RN will continue to monitor.

## 2015-04-04 NOTE — Progress Notes (Signed)
Patient ID: Joan Martin, female   DOB: 10-22-1985, 29 y.o.   MRN: [id number] Reviewed labs and patient with anemia which is low before hydration. She borderline and with clinical indication for transfusion. Will transfuse 1 unit RBC's. Also reviewed CXR and no evidence of infiltrates or opacity to suggest acute pneumonic process or acute chest. Will check with patient to see if any fevers overnight. Called patient and no answer.

## 2015-04-04 NOTE — Addendum Note (Signed)
Addended by: Marthann Schiller A on: 04/04/2015 08:41 AM   Modules accepted: Orders

## 2015-04-06 ENCOUNTER — Telehealth (HOSPITAL_COMMUNITY): Payer: Self-pay | Admitting: Hematology

## 2015-04-06 NOTE — Progress Notes (Signed)
Patient ID: Joan Martin, female   DOB: January 05, 1986, 29 y.o.   MRN: [id number] Expand All Collapse All  Patient called C/O "feeling sluggish and fatigued". Denies any pain at this time. Per patient "this is her usual feeling prior to going into crisis." Patient is wanting to come into [facility] for IV hydration. Advised patient I will discuss with Marcelino Duster, NP and give her a call back.  /  The date of this note is 01/24/2013 @ 12:35p.m.  FYI: This note was originally charted under the incorrect patient. Note placed in this chart as this is the correct patient.

## 2015-04-06 NOTE — Telephone Encounter (Signed)
Patient called C/O "feeling sluggish and fatigued". Denies any pain at this time. Per patient "this is her usual feeling prior to going into crisis." Patient is wanting to come into [facility] for IV hydration. Advised patient I will discuss with Marcelino Duster, NP and give her a call back. / FYI:  This note was originally charted under the incorrect patient.  Note placed in this chart as this is the correct patient.

## 2015-04-07 LAB — TYPE AND SCREEN
ABO/RH(D): O POS
ANTIBODY SCREEN: NEGATIVE
Unit division: 0
Unit division: 0

## 2015-04-09 ENCOUNTER — Other Ambulatory Visit (INDEPENDENT_AMBULATORY_CARE_PROVIDER_SITE_OTHER): Payer: Medicaid Other

## 2015-04-09 ENCOUNTER — Inpatient Hospital Stay (HOSPITAL_COMMUNITY)
Admission: AD | Admit: 2015-04-09 | Discharge: 2015-05-21 | DRG: 802 | Disposition: E | Payer: Medicaid Other | Source: Ambulatory Visit | Attending: Pulmonary Disease | Admitting: Pulmonary Disease

## 2015-04-09 ENCOUNTER — Other Ambulatory Visit: Payer: Self-pay | Admitting: Family Medicine

## 2015-04-09 ENCOUNTER — Encounter (HOSPITAL_COMMUNITY): Payer: Self-pay

## 2015-04-09 DIAGNOSIS — R6521 Severe sepsis with septic shock: Secondary | ICD-10-CM | POA: Diagnosis not present

## 2015-04-09 DIAGNOSIS — E162 Hypoglycemia, unspecified: Secondary | ICD-10-CM | POA: Diagnosis not present

## 2015-04-09 DIAGNOSIS — D72829 Elevated white blood cell count, unspecified: Secondary | ICD-10-CM | POA: Diagnosis not present

## 2015-04-09 DIAGNOSIS — K550 Acute vascular disorders of intestine: Secondary | ICD-10-CM | POA: Diagnosis not present

## 2015-04-09 DIAGNOSIS — R160 Hepatomegaly, not elsewhere classified: Secondary | ICD-10-CM | POA: Diagnosis present

## 2015-04-09 DIAGNOSIS — M6282 Rhabdomyolysis: Secondary | ICD-10-CM | POA: Diagnosis not present

## 2015-04-09 DIAGNOSIS — Z79899 Other long term (current) drug therapy: Secondary | ICD-10-CM | POA: Diagnosis not present

## 2015-04-09 DIAGNOSIS — R748 Abnormal levels of other serum enzymes: Secondary | ICD-10-CM | POA: Diagnosis not present

## 2015-04-09 DIAGNOSIS — G9340 Encephalopathy, unspecified: Secondary | ICD-10-CM

## 2015-04-09 DIAGNOSIS — D599 Acquired hemolytic anemia, unspecified: Secondary | ICD-10-CM | POA: Diagnosis present

## 2015-04-09 DIAGNOSIS — R932 Abnormal findings on diagnostic imaging of liver and biliary tract: Secondary | ICD-10-CM | POA: Diagnosis not present

## 2015-04-09 DIAGNOSIS — D5700 Hb-SS disease with crisis, unspecified: Principal | ICD-10-CM | POA: Diagnosis present

## 2015-04-09 DIAGNOSIS — R578 Other shock: Secondary | ICD-10-CM | POA: Diagnosis not present

## 2015-04-09 DIAGNOSIS — J4540 Moderate persistent asthma, uncomplicated: Secondary | ICD-10-CM | POA: Diagnosis not present

## 2015-04-09 DIAGNOSIS — J9600 Acute respiratory failure, unspecified whether with hypoxia or hypercapnia: Secondary | ICD-10-CM | POA: Diagnosis not present

## 2015-04-09 DIAGNOSIS — D65 Disseminated intravascular coagulation [defibrination syndrome]: Secondary | ICD-10-CM | POA: Diagnosis not present

## 2015-04-09 DIAGNOSIS — K661 Hemoperitoneum: Secondary | ICD-10-CM | POA: Diagnosis not present

## 2015-04-09 DIAGNOSIS — K745 Biliary cirrhosis, unspecified: Secondary | ICD-10-CM | POA: Diagnosis not present

## 2015-04-09 DIAGNOSIS — A419 Sepsis, unspecified organism: Secondary | ICD-10-CM | POA: Diagnosis not present

## 2015-04-09 DIAGNOSIS — K859 Acute pancreatitis, unspecified: Secondary | ICD-10-CM | POA: Diagnosis not present

## 2015-04-09 DIAGNOSIS — D649 Anemia, unspecified: Secondary | ICD-10-CM

## 2015-04-09 DIAGNOSIS — R58 Hemorrhage, not elsewhere classified: Secondary | ICD-10-CM | POA: Diagnosis not present

## 2015-04-09 DIAGNOSIS — E860 Dehydration: Secondary | ICD-10-CM | POA: Diagnosis present

## 2015-04-09 DIAGNOSIS — E861 Hypovolemia: Secondary | ICD-10-CM | POA: Diagnosis not present

## 2015-04-09 DIAGNOSIS — R51 Headache: Secondary | ICD-10-CM

## 2015-04-09 DIAGNOSIS — N179 Acute kidney failure, unspecified: Secondary | ICD-10-CM | POA: Diagnosis not present

## 2015-04-09 DIAGNOSIS — M549 Dorsalgia, unspecified: Secondary | ICD-10-CM | POA: Diagnosis present

## 2015-04-09 DIAGNOSIS — E874 Mixed disorder of acid-base balance: Secondary | ICD-10-CM | POA: Diagnosis not present

## 2015-04-09 DIAGNOSIS — R17 Unspecified jaundice: Secondary | ICD-10-CM | POA: Diagnosis present

## 2015-04-09 DIAGNOSIS — K7460 Unspecified cirrhosis of liver: Secondary | ICD-10-CM | POA: Diagnosis not present

## 2015-04-09 DIAGNOSIS — R579 Shock, unspecified: Secondary | ICD-10-CM | POA: Diagnosis not present

## 2015-04-09 DIAGNOSIS — K659 Peritonitis, unspecified: Secondary | ICD-10-CM | POA: Diagnosis not present

## 2015-04-09 DIAGNOSIS — D6959 Other secondary thrombocytopenia: Secondary | ICD-10-CM | POA: Diagnosis not present

## 2015-04-09 DIAGNOSIS — K7201 Acute and subacute hepatic failure with coma: Secondary | ICD-10-CM | POA: Diagnosis not present

## 2015-04-09 DIAGNOSIS — D571 Sickle-cell disease without crisis: Secondary | ICD-10-CM

## 2015-04-09 DIAGNOSIS — J45909 Unspecified asthma, uncomplicated: Secondary | ICD-10-CM | POA: Diagnosis present

## 2015-04-09 DIAGNOSIS — E876 Hypokalemia: Secondary | ICD-10-CM | POA: Diagnosis not present

## 2015-04-09 DIAGNOSIS — R188 Other ascites: Secondary | ICD-10-CM | POA: Insufficient documentation

## 2015-04-09 DIAGNOSIS — R680 Hypothermia, not associated with low environmental temperature: Secondary | ICD-10-CM | POA: Diagnosis not present

## 2015-04-09 DIAGNOSIS — J9690 Respiratory failure, unspecified, unspecified whether with hypoxia or hypercapnia: Secondary | ICD-10-CM

## 2015-04-09 DIAGNOSIS — G9341 Metabolic encephalopathy: Secondary | ICD-10-CM | POA: Diagnosis not present

## 2015-04-09 DIAGNOSIS — Z8673 Personal history of transient ischemic attack (TIA), and cerebral infarction without residual deficits: Secondary | ICD-10-CM

## 2015-04-09 DIAGNOSIS — R109 Unspecified abdominal pain: Secondary | ICD-10-CM

## 2015-04-09 DIAGNOSIS — Z66 Do not resuscitate: Secondary | ICD-10-CM | POA: Diagnosis not present

## 2015-04-09 DIAGNOSIS — Z7951 Long term (current) use of inhaled steroids: Secondary | ICD-10-CM

## 2015-04-09 DIAGNOSIS — R740 Nonspecific elevation of levels of transaminase and lactic acid dehydrogenase [LDH]: Secondary | ICD-10-CM | POA: Diagnosis present

## 2015-04-09 DIAGNOSIS — K7689 Other specified diseases of liver: Secondary | ICD-10-CM | POA: Diagnosis not present

## 2015-04-09 DIAGNOSIS — Z515 Encounter for palliative care: Secondary | ICD-10-CM

## 2015-04-09 DIAGNOSIS — R519 Headache, unspecified: Secondary | ICD-10-CM | POA: Diagnosis present

## 2015-04-09 DIAGNOSIS — N170 Acute kidney failure with tubular necrosis: Secondary | ICD-10-CM | POA: Diagnosis not present

## 2015-04-09 DIAGNOSIS — R52 Pain, unspecified: Secondary | ICD-10-CM | POA: Diagnosis not present

## 2015-04-09 DIAGNOSIS — D589 Hereditary hemolytic anemia, unspecified: Secondary | ICD-10-CM | POA: Insufficient documentation

## 2015-04-09 DIAGNOSIS — Z978 Presence of other specified devices: Secondary | ICD-10-CM

## 2015-04-09 DIAGNOSIS — J9601 Acute respiratory failure with hypoxia: Secondary | ICD-10-CM | POA: Diagnosis not present

## 2015-04-09 DIAGNOSIS — D588 Other specified hereditary hemolytic anemias: Secondary | ICD-10-CM | POA: Diagnosis not present

## 2015-04-09 DIAGNOSIS — N19 Unspecified kidney failure: Secondary | ICD-10-CM | POA: Diagnosis not present

## 2015-04-09 DIAGNOSIS — E875 Hyperkalemia: Secondary | ICD-10-CM | POA: Diagnosis not present

## 2015-04-09 DIAGNOSIS — K7290 Hepatic failure, unspecified without coma: Secondary | ICD-10-CM | POA: Diagnosis not present

## 2015-04-09 LAB — CBC WITH DIFFERENTIAL/PLATELET
BAND NEUTROPHILS: 0 % (ref 0–10)
BASOS PCT: 0 % (ref 0–1)
BLASTS: 0 %
Basophils Absolute: 0 10*3/uL (ref 0.0–0.1)
Eosinophils Absolute: 0 10*3/uL (ref 0.0–0.7)
Eosinophils Relative: 0 % (ref 0–5)
HEMATOCRIT: 16.1 % — AB (ref 36.0–46.0)
HEMOGLOBIN: 5.8 g/dL — AB (ref 12.0–15.0)
LYMPHS PCT: 33 % (ref 12–46)
Lymphs Abs: 4.5 10*3/uL — ABNORMAL HIGH (ref 0.7–4.0)
MCH: 36.7 pg — ABNORMAL HIGH (ref 26.0–34.0)
MCHC: 36 g/dL (ref 30.0–36.0)
MCV: 101.9 fL — ABNORMAL HIGH (ref 78.0–100.0)
Metamyelocytes Relative: 2 %
Monocytes Absolute: 1 10*3/uL (ref 0.1–1.0)
Monocytes Relative: 7 % (ref 3–12)
Myelocytes: 0 %
NEUTROS PCT: 58 % (ref 43–77)
Neutro Abs: 8.2 10*3/uL — ABNORMAL HIGH (ref 1.7–7.7)
OTHER: 0 %
PROMYELOCYTES ABS: 0 %
Platelets: 176 10*3/uL (ref 150–400)
RBC: 1.58 MIL/uL — ABNORMAL LOW (ref 3.87–5.11)
RDW: 24.3 % — ABNORMAL HIGH (ref 11.5–15.5)
WBC: 13.7 10*3/uL — AB (ref 4.0–10.5)
nRBC: 29 /100 WBC — ABNORMAL HIGH

## 2015-04-09 LAB — COMPREHENSIVE METABOLIC PANEL
ALK PHOS: 353 U/L — AB (ref 38–126)
ALT: 81 U/L — ABNORMAL HIGH (ref 14–54)
AST: 223 U/L — AB (ref 15–41)
Albumin: 2 g/dL — ABNORMAL LOW (ref 3.5–5.0)
Anion gap: 7 (ref 5–15)
BUN: 20 mg/dL (ref 6–20)
CO2: 14 mmol/L — AB (ref 22–32)
CREATININE: 0.76 mg/dL (ref 0.44–1.00)
Calcium: 7.7 mg/dL — ABNORMAL LOW (ref 8.9–10.3)
Chloride: 112 mmol/L — ABNORMAL HIGH (ref 101–111)
GFR calc non Af Amer: >60 mL/min (ref >60)
GLUCOSE: 95 mg/dL (ref 65–99)
Potassium: 3.4 mmol/L — ABNORMAL LOW (ref 3.5–5.1)
SODIUM: 133 mmol/L — AB (ref 135–145)
Total Bilirubin: 27.5 mg/dL (ref 0.3–1.2)
Total Protein: 6.2 g/dL — ABNORMAL LOW (ref 6.5–8.1)

## 2015-04-09 LAB — BASIC METABOLIC PANEL
BUN: 18 mg/dL (ref 6–23)
CO2: 14 meq/L — AB (ref 19–32)
Calcium: 8.4 mg/dL (ref 8.4–10.5)
Chloride: 105 mEq/L (ref 96–112)
Creat: 1.73 mg/dL — ABNORMAL HIGH (ref 0.50–1.10)
GLUCOSE: 102 mg/dL — AB (ref 70–99)
Potassium: 3.4 mEq/L — ABNORMAL LOW (ref 3.5–5.3)
SODIUM: 135 meq/L (ref 135–145)

## 2015-04-09 LAB — RETICULOCYTES
RBC.: 1.58 MIL/uL — AB (ref 3.87–5.11)
Retic Count, Absolute: 316 10*3/uL — ABNORMAL HIGH (ref 19.0–186.0)
Retic Ct Pct: 20 % — ABNORMAL HIGH (ref 0.4–3.1)

## 2015-04-09 LAB — LACTATE DEHYDROGENASE: LDH: 1045 U/L — ABNORMAL HIGH (ref 98–192)

## 2015-04-09 LAB — PREPARE RBC (CROSSMATCH)

## 2015-04-09 LAB — CULTURE, BLOOD (SINGLE): Organism ID, Bacteria: NO GROWTH

## 2015-04-09 MED ORDER — PROMETHAZINE HCL 25 MG PO TABS: 25.0000 mg | ORAL_TABLET | Freq: Four times a day (QID) | ORAL | Status: DC | PRN

## 2015-04-09 MED ORDER — MOMETASONE FURO-FORMOTEROL FUM 100-5 MCG/ACT IN AERO
2.0000 | INHALATION_SPRAY | Freq: Two times a day (BID) | RESPIRATORY_TRACT | Status: DC
  Administered 2015-04-09 – 2015-04-17 (×16): 2 via RESPIRATORY_TRACT
  Filled 2015-04-09: qty 8.8

## 2015-04-09 MED ORDER — HYDROMORPHONE HCL 2 MG/ML IJ SOLN
0.5000 mg | Freq: Once | INTRAMUSCULAR | Status: AC
  Administered 2015-04-09: 0.5 mg via INTRAVENOUS
  Filled 2015-04-09: qty 1

## 2015-04-09 MED ORDER — HYDROMORPHONE HCL 2 MG/ML IJ SOLN
0.8000 mg | Freq: Once | INTRAMUSCULAR | Status: AC
  Administered 2015-04-09: 0.8 mg via INTRAVENOUS
  Filled 2015-04-09: qty 1

## 2015-04-09 MED ORDER — FLUTICASONE PROPIONATE 50 MCG/ACT NA SUSP
1.0000 | Freq: Every day | NASAL | Status: DC
  Administered 2015-04-09 – 2015-04-17 (×9): 1 via NASAL
  Filled 2015-04-09: qty 16

## 2015-04-09 MED ORDER — ENOXAPARIN SODIUM 40 MG/0.4ML ~~LOC~~ SOLN
40.0000 mg | SUBCUTANEOUS | Status: DC
  Administered 2015-04-09 – 2015-04-15 (×7): 40 mg via SUBCUTANEOUS
  Filled 2015-04-09 (×9): qty 0.4

## 2015-04-09 MED ORDER — DIPHENHYDRAMINE HCL 25 MG PO CAPS
25.0000 mg | ORAL_CAPSULE | Freq: Four times a day (QID) | ORAL | Status: DC | PRN
  Administered 2015-04-09 – 2015-04-14 (×5): 25 mg via ORAL
  Filled 2015-04-09 (×5): qty 1

## 2015-04-09 MED ORDER — KETOROLAC TROMETHAMINE 30 MG/ML IJ SOLN
30.0000 mg | Freq: Four times a day (QID) | INTRAMUSCULAR | Status: DC
  Administered 2015-04-09 – 2015-04-14 (×19): 30 mg via INTRAVENOUS
  Filled 2015-04-09 (×24): qty 1

## 2015-04-09 MED ORDER — HYDROMORPHONE HCL 1 MG/ML IJ SOLN
0.5000 mg | INTRAMUSCULAR | Status: DC | PRN
Start: 2015-04-09 — End: 2015-04-10

## 2015-04-09 MED ORDER — SODIUM CHLORIDE 0.9 % IV BOLUS (SEPSIS)
1000.0000 mL | Freq: Once | INTRAVENOUS | Status: AC
  Administered 2015-04-09: 1000 mL via INTRAVENOUS

## 2015-04-09 MED ORDER — ALBUTEROL SULFATE (2.5 MG/3ML) 0.083% IN NEBU: 2.5000 mg | INHALATION_SOLUTION | Freq: Four times a day (QID) | RESPIRATORY_TRACT | Status: DC | PRN

## 2015-04-09 MED ORDER — SENNOSIDES-DOCUSATE SODIUM 8.6-50 MG PO TABS
1.0000 | ORAL_TABLET | Freq: Every day | ORAL | Status: DC
  Administered 2015-04-09 – 2015-04-16 (×7): 1 via ORAL
  Filled 2015-04-09 (×8): qty 1

## 2015-04-09 MED ORDER — TOPIRAMATE 25 MG PO TABS
50.0000 mg | ORAL_TABLET | Freq: Two times a day (BID) | ORAL | Status: DC
  Administered 2015-04-09 – 2015-04-16 (×15): 50 mg via ORAL
  Filled 2015-04-09 (×17): qty 2

## 2015-04-09 MED ORDER — OXYCODONE-ACETAMINOPHEN 5-325 MG PO TABS
1.0000 | ORAL_TABLET | ORAL | Status: DC | PRN
Start: 2015-04-09 — End: 2015-04-10
  Administered 2015-04-09: 1 via ORAL
  Filled 2015-04-09: qty 1

## 2015-04-09 MED ORDER — SODIUM CHLORIDE 0.9 % IV SOLN: Freq: Once | INTRAVENOUS | Status: DC

## 2015-04-09 MED ORDER — DEXTROSE-NACL 5-0.45 % IV SOLN
INTRAVENOUS | Status: DC
  Administered 2015-04-09: 23:00:00 via INTRAVENOUS
  Administered 2015-04-09: 100 mL/h via INTRAVENOUS
  Administered 2015-04-10: 07:00:00 via INTRAVENOUS

## 2015-04-09 MED ORDER — ONDANSETRON HCL 4 MG PO TABS
4.0000 mg | ORAL_TABLET | Freq: Four times a day (QID) | ORAL | Status: DC
  Administered 2015-04-09 – 2015-04-10 (×3): 4 mg via ORAL
  Filled 2015-04-09 (×7): qty 1

## 2015-04-09 MED ORDER — FOLIC ACID 1 MG PO TABS
1.0000 mg | ORAL_TABLET | Freq: Every day | ORAL | Status: DC
  Administered 2015-04-10 – 2015-04-17 (×8): 1 mg via ORAL
  Filled 2015-04-09 (×8): qty 1

## 2015-04-09 MED ORDER — OXYCODONE-ACETAMINOPHEN 10-325 MG PO TABS: 1.0000 | ORAL_TABLET | ORAL | Status: DC | PRN

## 2015-04-09 MED ORDER — POLYETHYLENE GLYCOL 3350 17 G PO PACK: 17.0000 g | PACK | Freq: Two times a day (BID) | ORAL | Status: DC | PRN

## 2015-04-09 MED ORDER — HYDROMORPHONE HCL 2 MG/ML IJ SOLN: 1.0000 mg | Freq: Once | INTRAMUSCULAR | Status: DC

## 2015-04-09 MED ORDER — OXYCODONE HCL 5 MG PO TABS: 5.0000 mg | ORAL_TABLET | ORAL | Status: DC | PRN

## 2015-04-09 NOTE — Progress Notes (Signed)
CRITICAL VALUE ALERT  Critical value received: hgb 5.8  Date of notification:  03/29/2015  Time of notification:  1500  Critical value read back; Yes Nurse who received alert:  Merlene Morse  MD notified (1st page): Agboola  Time of first page: 15:02   Responding MD: Nathanial Millman  Time MD responded:  15:05

## 2015-04-09 NOTE — Discharge Summary (Signed)
Physician Discharge Summary  Carolan Avedisian ZOX:[id number] DOB: [birth year] DOA: 13-Apr-2015  PCP: MATTHEWS,MICHELLE A., MD  Admit date: 04/13/15 Discharge date: 04/13/2015  Discharge Diagnoses:  Active Problems:   Hb-SS disease with crisis   Discharge Condition: improved  Disposition: home   Diet:General  Wt Readings from Last 3 Encounters:  2015-04-13 135 lb (61.236 kg)  03/28/15 135 lb (61.236 kg)  01/17/15 131 lb 12.8 oz (59.784 kg)    History of present illness:  Joan Martin is a 29yo female with HbSS disease, who presents for pain in her lower back and a general feeling of not feeling well. Pain has been lingering on for about 1 week. She was taking Percocet but has found no real relief in pain. Pain may have been aggravated by her busy schedule. She rates her pain as a 9/10. She denies pain elsewhere and likens pain to her sickle cell pain. She denies any cough, CP, SOB, vomiting, diarrhea, or fever. She presents to the day hospital for further pain management.  Hospital Course:  Pt was treated with weight based rapid IVF and Toradol . Pain was managed with Dilaudid injections (0.5mg  and 0.8mg ). Pain was brought down to a zero. However, on pt's labs her Hgb was found to be low at 5.8 from her normal baseline of 9-10. She also had a high LDH and dark scleral icterus suggesting gross hemolysis.1unit PRBC was ordered but was not ready to be given in day hospital. Pt will be admitted for blood transfusion, IV hydration, and further observation. Marland Kitchen  Discharge Exam:  Filed Vitals:   04/13/2015 1723  BP: 106/60  Pulse: 84  Temp: 98 F (36.7 C)  Resp: 18   Filed Vitals:   04/13/15 1216 2015-04-13 1700 Apr 13, 2015 1723  BP: 110/58 104/60 106/60  Pulse: 97 88 84  Temp: 98.4 F (36.9 C) 98.4 F (36.9 C) 98 F (36.7 C)  TempSrc: Oral Oral Oral  Resp: Height:  (1.702 m)    Weight: 135 lb (61.236 kg)    SpO2: 97% 100% 98%   General: Alert, awake, oriented x3, in no  acute distress.  HEENT: [place]/AT PEERLA, EOMI, 2+sleral icterus  Neck: Trachea midline, no masses, no thyromegaly no JVD, 1cm smooth mobile lymph node in anterior right SCM/ OROPHARYNX: Moist, No exudate/ erythema/lesions.  Heart: Regular rate and rhythm, without murmurs, rubs, gallops  Lungs: Clear to auscultation, no wheezing or rhonchi noted.  Abdomen: Soft, nontender, nondistended, positive bowel sounds, no masses no hepatosplenomegaly noted..  Neuro: No focal neurological deficits noted cranial nerves II through XII grossly intact. 5/5 distal strength, nl sensory Musculoskeletal: No warm swelling or erythema around joints, no spinal tenderness noted. Psychiatric: Patient alert and oriented x3, good insight and cognition, good recent to remote recall.    Discharge Instructions     Medication List    ASK your doctor about these medications        albuterol (2.5 MG/3ML) 0.083% nebulizer solution  Commonly known as:  PROVENTIL  Take 3 mLs (2.5 mg total) by nebulization every 6 (six) hours as needed for shortness of breath.     diphenhydrAMINE 25 MG tablet  Commonly known as:  BENADRYL  Take 25 mg by mouth every 6 (six) hours as needed for itching.     ergocalciferol 50000 UNITS capsule  Commonly known as:  VITAMIN D2  Take 1 capsule (50,000 Units total) by mouth once a week.     Fluticasone-Salmeterol 250-50 MCG/DOSE Aepb  Commonly known as:  ADVAIR  Inhale 1 puff into the lungs daily.     folic acid 1 MG tablet  Commonly known as:  FOLVITE  Take 1 tablet (1 mg total) by mouth daily.     hydroxyurea 500 MG capsule  Commonly known as:  HYDREA  TAKE ONE CAPSULE BY MOUTH TWICE DAILY     ibuprofen 600 MG tablet  Commonly known as:  ADVIL,MOTRIN  Take 1 tablet (600 mg total) by mouth every 8 (eight) hours as needed for pain.     mometasone 50 MCG/ACT nasal spray  Commonly known as:  NASONEX  Place 2 sprays into the nose daily.     ondansetron 4 MG tablet  Commonly known  as:  ZOFRAN  Take 1 tablet (4 mg total) by mouth every 6 (six) hours.     oxyCODONE-acetaminophen 10-325 MG per tablet  Commonly known as:  PERCOCET  Take 1 tablet every 4 hours for severe pain     promethazine 25 MG tablet  Commonly known as:  PHENERGAN  Take 25 mg by mouth every 6 (six) hours as needed for nausea.     SUMAtriptan 25 MG tablet  Commonly known as:  IMITREX  Take 1 tablet (25 mg total) by mouth once. May repeat in 2 hours if headache persists or recurs. Not to exceed 2 doses in 24 hours     topiramate 50 MG tablet  Commonly known as:  TOPAMAX  Take 0.5tab BID x7d, then 1tab BID.          The results of significant diagnostics from this hospitalization (including imaging, microbiology, ancillary and laboratory) are listed below for reference.    Significant Diagnostic Studies: No results found.  Microbiology: Recent Results (from the past 240 hour(s))  Culture, blood (single)     Status: None   Collection Time: 04/03/15  3:26 PM  Result Value Ref Range Status   Organism ID, Bacteria NO GROWTH 5 DAYS  Final     Labs: Basic Metabolic Panel:  Recent Labs Lab 04/03/15 1411 04/10/2015 1446  NA 130* 133*  K 3.7 3.4*  CL 104 112*  CO2 17* 14*  GLUCOSE 97 95  BUN 11 20  CREATININE 0.85 0.76  CALCIUM 8.4 7.7*   Liver Function Tests:  Recent Labs Lab 03/31/2015 1446  AST 223*  ALT 81*  ALKPHOS 353*  BILITOT 27.5*  PROT 6.2*  ALBUMIN 2.0*   No results for input(s): LIPASE, AMYLASE in the last 168 hours. No results for input(s): AMMONIA in the last 168 hours. CBC:  Recent Labs Lab 04/03/15 1411 04/17/2015 1446  WBC 8.7 13.7*  NEUTROABS 5.0 8.2*  HGB 6.8* 5.8*  HCT 18.8* 16.1*  MCV 102.7* 101.9*  PLT 148* 176    Time coordinating discharge: >52minutes  Signed:  Serina Cowper  03/21/2015, 5:31 PM

## 2015-04-09 NOTE — H&P (Signed)
[facility] History and Physical  Joan Martin ZOX:[id number] DOB: 1986-03-30 DOA: 17-Apr-2015   PCP: MATTHEWS,MICHELLE A., MD   Chief Complaint: pain, malaise  HPI: Joan Martin is a 29yo female with HbSS disease, who presents for pain in her lower back and a general feeling of not feeling well. Pain has been lingering on for about 1 week. She was taking Percocet but has found no real relief in pain. Pain may have been aggravated by her busy schedule. She rates her pain as a 9/10. She denies pain elsewhere and likens pain to her sickle cell pain. Her PO intake has been poor over the past week. She denies any cough, CP, SOB, vomiting, diarrhea, or fever. She initially presented to the day hospital for further pain management. There, pt was treated with weight based rapid IVF and Toradol . Pain was managed with Dilaudid injections (0.5mg  and 0.8mg ). Pain was brought down to a zero. However, on pt's labs her Hgb was found to be low at 5.8 from her normal baseline of 9-10. She also had a high LDH, high total bili, and dark scleral icterus suggesting gross hemolysis. One unit PRBC was ordered but was not ready to be given in the day hospital. Pt was admitted for blood transfusion, IV hydration, and further observation.   Review of Systems:  Constitutional: No weight loss, night sweats, Fevers, chills, fatigue. +malaise, +poor appetite HEENT: No headaches, dizziness, seizures, vision changes, difficulty swallowing,Tooth/dental problems,Sore throat, No sneezing, itching, ear ache, nasal congestion, post nasal drip,  Cardio-vascular: No chest pain, Orthopnea, PND, swelling in lower extremities, anasarca, dizziness, palpitations  GI: No heartburn, indigestion, abdominal pain, nausea, vomiting, diarrhea, change in bowel habits, loss of appetite  Resp: No shortness of breath with exertion or at rest. No excess mucus, no productive cough, No non-productive cough, No coughing up of blood.No  change in color of mucus.No wheezing.No chest wall deformity  Skin: no rash or lesions.  GU: no dysuria, change in color of urine, no urgency or frequency. No flank pain.  Musculoskeletal: No joint pain or swelling. No decreased range of motion. + back pain.  Psych: No change in mood or affect. No depression or anxiety. No memory loss.    Past Medical History  Diagnosis Date  . Sickle cell anemia   . Stroke     7 years ago  . Sickle cell anemia   . Asthma    Past Surgical History  Procedure Laterality Date  . Cholecystectomy     Social History:  reports that she has never smoked. She has never used smokeless tobacco. She reports that she does not drink alcohol or use illicit drugs.  Allergies  Allergen Reactions  . Cephalosporins     Possible family reaction  . Citrus Other (See Comments)    Severity varies/ Reaction varies hives around mouth to swelling around mouth and lips.   . Morphine And Related Hives    Patient states she can take IV dilaudid with benadryl    Family History  Problem Relation Age of Onset  . Diabetes Mother   . Hypertension Mother   . Diabetes Father   . Hypertension Father   . Cancer Maternal Aunt   . Sickle cell anemia Sister     Prior to Admission medications   Medication Sig Start Date End Date Taking? Authorizing Provider  albuterol (PROVENTIL) (2.5 MG/3ML) 0.083% nebulizer solution Take 3 mLs (2.5 mg total) by nebulization every 6 (six) hours as needed for  shortness of breath. 12/01/13  Yes Massie Maroon, FNP  diphenhydrAMINE (BENADRYL) 25 MG tablet Take 25 mg by mouth every 6 (six) hours as needed for itching.   Yes Historical Provider, MD  ergocalciferol (VITAMIN D2) 50000 UNITS capsule Take 1 capsule (50,000 Units total) by mouth once a week. 11/23/14  Yes Massie Maroon, FNP  Fluticasone-Salmeterol (ADVAIR) 250-50 MCG/DOSE AEPB Inhale 1 puff into the lungs daily.   Yes Historical Provider, MD  folic acid (FOLVITE) 1 MG tablet Take  1 tablet (1 mg total) by mouth daily. 01/03/14  Yes Massie Maroon, FNP  hydroxyurea (HYDREA) 500 MG capsule TAKE ONE CAPSULE BY MOUTH TWICE DAILY   Yes Altha Harm, MD  ibuprofen (ADVIL,MOTRIN) 600 MG tablet Take 1 tablet (600 mg total) by mouth every 8 (eight) hours as needed for pain. 07/27/13  Yes Grayce Sessions, NP  mometasone (NASONEX) 50 MCG/ACT nasal spray Place 2 sprays into the nose daily. 11/23/14  Yes Massie Maroon, FNP  ondansetron (ZOFRAN) 4 MG tablet Take 1 tablet (4 mg total) by mouth every 6 (six) hours. 10/19/14  Yes Massie Maroon, FNP  oxyCODONE-acetaminophen (PERCOCET) 10-325 MG per tablet Take 1 tablet every 4 hours for severe pain 02/13/15  Yes Massie Maroon, FNP  topiramate (TOPAMAX) 50 MG tablet Take 0.5tab BID x7d, then 1tab BID. 01/17/15  Yes Drema Dallas, DO  promethazine (PHENERGAN) 25 MG tablet Take 25 mg by mouth every 6 (six) hours as needed for nausea.    Historical Provider, MD  SUMAtriptan (IMITREX) 25 MG tablet Take 1 tablet (25 mg total) by mouth once. May repeat in 2 hours if headache persists or recurs. Not to exceed 2 doses in 24 hours 10/19/14   Massie Maroon, FNP   Physical Exam: Filed Vitals:   04-May-2015 1216 05-04-15 1700 2015/05/04 1723  BP: 110/58 104/60 106/60  Pulse: 97 88 84  Temp: 98.4 F (36.9 C) 98.4 F (36.9 C) 98 F (36.7 C)  TempSrc: Oral Oral Oral  Resp: 18 18 18   Height: 5\' 7"  (1.702 m)    Weight: 135 lb (61.236 kg)    SpO2: 97% 100% 98%   General: Alert, awake, oriented x3, in no acute distress.  HEENT: [place]/AT [place]ER[place], EOMI, 2+sleral icterus  Neck: Trachea midline, no masses, no thyromegaly no JVD, 1cm smooth mobile lymph node in anterior right SCM/ OROPHARYNX: Moist, No exudate/ erythema/lesions.  Heart: Regular rate and rhythm, without murmurs, rubs, gallops  Lungs: Clear to auscultation, no wheezing or rhonchi noted.  Abdomen: Soft, nontender, nondistended, positive bowel sounds, no masses no  hepatosplenomegaly noted..  Neuro: No focal neurological deficits noted cranial nerves II through XII grossly intact. 5/5 distal strength, nl sensory Musculoskeletal: No warm swelling or erythema around joints, no spinal tenderness noted. Psychiatric: Patient alert and oriented x3, good insight and cognition, good recent to remote recall.  Labs on Admission:   Basic Metabolic Panel:  Recent Labs Lab 04/03/15 1411 2015/05/04 1446  NA 130* 133*  K 3.7 3.4*  CL 104 112*  CO2 17* 14*  GLUCOSE 97 95  BUN 11 20  CREATININE 0.85 0.76  CALCIUM 8.4 7.7*   Liver Function Tests:  Recent Labs Lab 04-May-2015 1446  AST 223*  ALT 81*  ALKPHOS 353*  BILITOT 27.5*  PROT 6.2*  ALBUMIN 2.0*   CBC:  Recent Labs Lab 04/03/15 1411 May 04, 2015 1446  WBC 8.7 13.7*  NEUTROABS 5.0 8.2*  HGB 6.8* 5.8*  HCT  18.8* 16.1*  MCV 102.7* 101.9*  PLT 148* 176   BNP: Invalid input(s): POCBNP   Radiological Exams on Admission: No results found.   Assessment/Plan: Active Problems:   Dehydration   Jaundice, hemolytic   Frequent headaches   Hb-SS disease with crisis   Asthma, chronic  1)Sickle Cell Crisis: Hgb SS/Opioid naive. Presented with signs consistent with VOC. Pain improved greatly in day hospital to 0/10. Will order her home Percocet 325/10mg  q4h PRN, and Dilaudid 0.5mg  q2h PRN for pain management. Continue Toradol.  2)Anemia: Hgb low at 5.8 today. Likely due to hemolysis as her LDH high >1,000. Will transfuse 1 unit PRBC and recheck in the morning. Transfuse again if Hgb<6 or symptomatic. AST and T. Bili can be elevated in acute hemolysis. Will monitor transaminases for now as she does not have any abdominal pain. Consider imaging if LFTs worsen.  3)Dehydration: Pt reported poor PO. She was aggressively hydrated in the day hospital with 2L NS. Will continue maintenance IV fluids as below.  4)Asthma, stable: Pt is on Advair 250/50 at home, but it is not available on our formulary. Will  substitute with Dulera BID. Continue Albuterol neb PRN.   5)Sickle Cell Care: Continue folic acid, hold Hydrea due to low Hgb  6)Migraines: Continue home Topamax for prophylaxis. Hold sumatriptan unless having acute symptoms  7) FEN/GI : Potassium is slightly low at 3.4. Will hold off repletion as it may rise due to her acute hemolysis. Monitor for now. Regular Diet  IV fluids-D5/0.45% Bowel regimen in place   Time spend: Code Status: Full code DVT prophy: enoxaparin Family Communication: none Disposition Plan: pending improvement, likely in 1-2 days  Serina Cowper, MD  Pager [phone]  If 7PM-7AM, please contact night-coverage [email or web address] Password [facility] 04-30-2015, 6:02 PM

## 2015-04-09 NOTE — H&P (Addendum)
[facility] History and Physical  Joan Martin ZOX:[id number] DOB: [birth year] DOA: April 20, 2015   PCP: MATTHEWS,MICHELLE A., MD   Chief Complaint: Back pain  HPI: Joan Martin is a 29yo female with HbSS disease, who presents for pain in her lower back and a general feeling of not feeling well. Pain has been lingering on for about 1 week. She was taking Percocet  but has found no real relief in pain. Pain may have been aggravated by her busy schedule. She rates her pain as a 9/10. She denies pain elsewhere and likens pain to her sickle cell pain. She denies any cough, CP, SOB, vomiting, diarrhea, or fever. She presents to the day hospital for further pain management.   Review of Systems:  Constitutional: No weight loss, night sweats, Fevers, chills, fatigue. +malaise HEENT: No headaches, dizziness, seizures, vision changes, difficulty swallowing,Tooth/dental problems,Sore throat, No sneezing, itching, ear ache, nasal congestion, post nasal drip,  Cardio-vascular: No chest pain, Orthopnea, PND, swelling in lower extremities, anasarca, dizziness, palpitations  GI: No heartburn, indigestion, abdominal pain, nausea, vomiting, diarrhea, change in bowel habits, loss of appetite  Resp: No shortness of breath with exertion or at rest. No excess mucus, no productive cough, No non-productive cough, No coughing up of blood.No change in color of mucus.No wheezing.No chest wall deformity  Skin: no rash or lesions.  GU: no dysuria, change in color of urine, no urgency or frequency. No flank pain.  Musculoskeletal: No joint pain or swelling. No decreased range of motion. + back pain.  Psych: No change in mood or affect. No depression or anxiety. No memory loss.    Past Medical History  Diagnosis Date  . Sickle cell anemia   . Stroke     7 years ago  . Sickle cell anemia   . Asthma    Past Surgical History  Procedure Laterality Date  . Cholecystectomy     Social History:  reports  that she has never smoked. She has never used smokeless tobacco. She reports that she does not drink alcohol or use illicit drugs.  Allergies  Allergen Reactions  . Cephalosporins     Possible family reaction  . Citrus Other (See Comments)    Severity varies/ Reaction varies hives around mouth to swelling around mouth and lips.   . Morphine And Related Hives    Patient states she can take IV dilaudid with benadryl    Family History  Problem Relation Age of Onset  . Diabetes Mother   . Hypertension Mother   . Diabetes Father   . Hypertension Father   . Cancer Maternal Aunt   . Sickle cell anemia Sister    Current Facility-Administered Medications on File Prior to Encounter  Medication Dose Route Frequency Provider Last Rate Last Dose  . ondansetron (ZOFRAN) injection 4 mg  4 mg Intravenous Once Grayce Sessions, NP       Current Outpatient Prescriptions on File Prior to Encounter  Medication Sig Dispense Refill  . albuterol (PROVENTIL) (2.5 MG/3ML) 0.083% nebulizer solution Take 3 mLs (2.5 mg total) by nebulization every 6 (six) hours as needed for shortness of breath. 75 mL 12  . diphenhydrAMINE (BENADRYL) 25 MG tablet Take 25 mg by mouth every 6 (six) hours as needed for itching.    . ergocalciferol (VITAMIN D2) 50000 UNITS capsule Take 1 capsule (50,000 Units total) by mouth once a week. 4 capsule 3  . Fluticasone-Salmeterol (ADVAIR) 250-50 MCG/DOSE AEPB Inhale 1 puff into the lungs daily.    Marland Kitchen  folic acid (FOLVITE) 1 MG tablet Take 1 tablet (1 mg total) by mouth daily. 30 tablet 11  . hydroxyurea (HYDREA) 500 MG capsule TAKE ONE CAPSULE BY MOUTH TWICE DAILY 180 capsule 0  . ibuprofen (ADVIL,MOTRIN) 600 MG tablet Take 1 tablet (600 mg total) by mouth every 8 (eight) hours as needed for pain. 30 tablet 0  . mometasone (NASONEX) 50 MCG/ACT nasal spray Place 2 sprays into the nose daily. 17 g 0  . ondansetron (ZOFRAN) 4 MG tablet Take 1 tablet (4 mg total) by mouth every 6 (six)  hours. 20 tablet 0  . oxyCODONE-acetaminophen (PERCOCET) 10-325 MG per tablet Take 1 tablet every 4 hours for severe pain 60 tablet 0  . topiramate (TOPAMAX) 50 MG tablet Take 0.5tab BID x7d, then 1tab BID. 60 tablet 0  . promethazine (PHENERGAN) 25 MG tablet Take 25 mg by mouth every 6 (six) hours as needed for nausea.    . SUMAtriptan (IMITREX) 25 MG tablet Take 1 tablet (25 mg total) by mouth once. May repeat in 2 hours if headache persists or recurs. Not to exceed 2 doses in 24 hours 10 tablet 0    Physical Exam: Filed Vitals:   04/08/2015 1216  BP: 110/58  Pulse: 97  Temp: 98.4 F (36.9 C)  TempSrc: Oral  Resp: 18  Height:  (1.702 m)  Weight: 135 lb (61.236 kg)  SpO2: 97%   General: Alert, awake, oriented x3, in no acute distress.  HEENT: [place]/AT [place]ER[place], EOMI, 2+scleral icterus Neck: Trachea midline,  no masses, 1cm smooth mobile lymph node in anterior right scm  OROPHARYNX:  Moist, No exudate/ erythema/lesions.  Heart: Regular rate and rhythm, without murmurs, rubs, gallops Lungs: Clear to auscultation, no wheezing or rhonchi noted. No increased vocal fremitus resonant to percussion  Abdomen: Soft, nontender, nondistended, positive bowel sounds, no masses no hepatosplenomegaly noted.  Neuro: No focal neurological deficits noted cranial nerves II through XII grossly intact. 5/5 distal strength, nl sensory Extremities: No cyanosis or clubbing  Labs on Admission:  Basic Metabolic Panel:  Recent Labs Lab 04/03/15 1411 04/05/2015 1446  NA 130* 133*  K 3.7 3.4*  CL 104 112*  CO2 17* 14*  GLUCOSE 97 95  BUN 11 20  CREATININE 0.85 0.76  CALCIUM 8.4 7.7*   Liver Function Tests:  CBC:  Recent Labs Lab 04/03/15 1411  WBC 8.7  NEUTROABS 5.0  HGB 6.8*  HCT 18.8*  MCV 102.7*  PLT 148*    Assessment/Plan: Active Problems:   Hb-SS disease with crisis Pt will be treated with initially treated with weight based rapid re-dosing IVF and Toradol. She will be bolused  with NS as she appears dehydrated on exam. If pain remains above 6 then Pt will be transitioned to a weight based (Dilaudid/Morphine) PCA . Patient will be re-evaluated for pain in the context of function and relationship to baseline as care progresses. Labs Pending  Time spend:>55 minutes Code Status: Full code Family Communication: none Disposition Plan: home if pain improves  Serina Cowper, MD  Pager [phone]  If 7PM-7AM, please contact night-coverage [email or web address] Password [email or web address] 04/12/2015, 4:02 PM

## 2015-04-09 NOTE — Progress Notes (Signed)
Patient ID: Joan Martin, female   DOB: [birth year], 29 y.o.   MRN: [id number] Report called to [facility] on 3W.  Patient states currently pain is 0/10 on scale.  Blood is infusing per order.  Questions answered, patient transported via wheelchair.

## 2015-04-09 NOTE — Progress Notes (Signed)
CRITICAL VALUE ALERT  Critical value received:  Total Bilirubin 27.5  Date of notification:  05/03/15  Time of notification:  1527  Critical value read back: yes  Nurse who received alert: Merlene Morse RN  MD notified (1st page): Agboola  Time of first page:  1530   Responding MD:  Nathanial Millman  Time MD responded:  1532 Acknowledged No new orders

## 2015-04-10 ENCOUNTER — Telehealth: Payer: Self-pay

## 2015-04-10 ENCOUNTER — Other Ambulatory Visit: Payer: Self-pay | Admitting: Family Medicine

## 2015-04-10 DIAGNOSIS — R748 Abnormal levels of other serum enzymes: Secondary | ICD-10-CM

## 2015-04-10 DIAGNOSIS — D599 Acquired hemolytic anemia, unspecified: Secondary | ICD-10-CM

## 2015-04-10 LAB — COMPREHENSIVE METABOLIC PANEL
ALBUMIN: 1.9 g/dL — AB (ref 3.5–5.0)
ALT: 71 U/L — ABNORMAL HIGH (ref 14–54)
ANION GAP: 10 (ref 5–15)
AST: 206 U/L — ABNORMAL HIGH (ref 15–41)
Alkaline Phosphatase: 364 U/L — ABNORMAL HIGH (ref 38–126)
BUN: 14 mg/dL (ref 6–20)
CO2: 14 mmol/L — ABNORMAL LOW (ref 22–32)
CREATININE: 0.49 mg/dL (ref 0.44–1.00)
Calcium: 7.7 mg/dL — ABNORMAL LOW (ref 8.9–10.3)
Chloride: 108 mmol/L (ref 101–111)
GFR calc Af Amer: >60 mL/min (ref >60)
GFR calc non Af Amer: >60 mL/min (ref >60)
Glucose, Bld: 86 mg/dL (ref 65–99)
Potassium: 3.4 mmol/L — ABNORMAL LOW (ref 3.5–5.1)
Sodium: 132 mmol/L — ABNORMAL LOW (ref 135–145)
TOTAL PROTEIN: 5.9 g/dL — AB (ref 6.5–8.1)
Total Bilirubin: 28.4 mg/dL (ref 0.3–1.2)

## 2015-04-10 LAB — CBC WITH DIFFERENTIAL/PLATELET
Basophils Absolute: 0.3 10*3/uL — ABNORMAL HIGH (ref 0.0–0.1)
Basophils Relative: 1 % (ref 0–1)
Basophils Relative: 2 % — ABNORMAL HIGH (ref 0–1)
Eosinophils Absolute: 0.1 10*3/uL (ref 0.0–0.7)
Eosinophils Relative: 1 % (ref 0–5)
Eosinophils Relative: 1 % (ref 0–5)
HCT: 19.9 % — ABNORMAL LOW (ref 36.0–46.0)
HEMATOCRIT: 19.9 % — AB (ref 36.0–46.0)
Hemoglobin: 6.9 g/dL — CL (ref 12.0–15.0)
Hemoglobin: 6.8 g/dL — CL (ref 12.0–15.0)
LYMPHS PCT: 22 % (ref 12–46)
Lymphocytes Relative: 38 % (ref 12–46)
Lymphs Abs: 2.8 10*3/uL (ref 0.7–4.0)
MCH: 36.3 pg — AB (ref 26.0–34.0)
MCH: 33.5 pg (ref 26.0–34.0)
MCHC: 34.7 g/dL (ref 30.0–36.0)
MCHC: 34.2 g/dL (ref 30.0–36.0)
MCV: 104.7 fL — ABNORMAL HIGH (ref 78.0–100.0)
MCV: 98 fL (ref 78.0–100.0)
MONOS PCT: 13 % — AB (ref 3–12)
MPV: 11.1 fL (ref 8.6–12.4)
Monocytes Absolute: 1.6 10*3/uL — ABNORMAL HIGH (ref 0.1–1.0)
Monocytes Relative: 6 % (ref 3–12)
Neutro Abs: 7.8 10*3/uL — ABNORMAL HIGH (ref 1.7–7.7)
Neutrophils Relative %: 54 % (ref 43–77)
Neutrophils Relative %: 62 % (ref 43–77)
PLATELETS: 212 10*3/uL (ref 150–400)
Platelets: 190 10*3/uL (ref 150–400)
RBC: 1.9 MIL/uL — AB (ref 3.87–5.11)
RBC: 2.03 MIL/uL — AB (ref 3.87–5.11)
RDW: 24.8 % — AB (ref 11.5–15.5)
RDW: 24.1 % — ABNORMAL HIGH (ref 11.5–15.5)
WBC: 10.4 10*3/uL (ref 4.0–10.5)
WBC: 12.6 10*3/uL — ABNORMAL HIGH (ref 4.0–10.5)
nRBC: 33 /100 WBC — ABNORMAL HIGH

## 2015-04-10 LAB — LACTATE DEHYDROGENASE: LDH: 990 U/L — AB (ref 98–192)

## 2015-04-10 LAB — RETICULOCYTES
RBC.: 2.03 MIL/uL — ABNORMAL LOW (ref 3.87–5.11)
RETIC CT PCT: 17.8 % — AB (ref 0.4–3.1)
Retic Count, Absolute: 361.3 10*3/uL — ABNORMAL HIGH (ref 19.0–186.0)

## 2015-04-10 MED ORDER — ONDANSETRON HCL 4 MG/2ML IJ SOLN: 4.0000 mg | Freq: Four times a day (QID) | INTRAMUSCULAR | Status: DC | PRN

## 2015-04-10 MED ORDER — NALOXONE HCL 0.4 MG/ML IJ SOLN: 0.4000 mg | INTRAMUSCULAR | Status: DC | PRN

## 2015-04-10 MED ORDER — SODIUM CHLORIDE 0.9 % IJ SOLN: 9.0000 mL | INTRAMUSCULAR | Status: DC | PRN

## 2015-04-10 MED ORDER — HYDROMORPHONE 0.3 MG/ML IV SOLN
INTRAVENOUS | Status: DC
  Administered 2015-04-10: 0.3 mg via INTRAVENOUS
  Administered 2015-04-10: 0 mg via INTRAVENOUS
  Administered 2015-04-10 (×2): 0.3 mg via INTRAVENOUS
  Administered 2015-04-11: 0 mg via INTRAVENOUS
  Administered 2015-04-11: 0.3 mg via INTRAVENOUS
  Administered 2015-04-11: 0.01 mg via INTRAVENOUS
  Administered 2015-04-11 – 2015-04-13 (×5): 0.3 mg via INTRAVENOUS
  Administered 2015-04-13: 0.6 mg via INTRAVENOUS
  Administered 2015-04-13 – 2015-04-14 (×2): 0.3 mg via INTRAVENOUS
  Filled 2015-04-10: qty 25

## 2015-04-10 NOTE — Telephone Encounter (Signed)
[facility] lab called with Critical hgb of 6.9 from lab draw in clinic on 04/27/2015. [facility] April 27, 2015 and hgb was 5.8, pt received transfusion of 1 unit after labs were drawn. Patient is currently admitted at [facility].

## 2015-04-10 NOTE — Progress Notes (Signed)
SICKLE CELL SERVICE PROGRESS NOTE  Joan Martin ZOX:[id number] DOB: 1986/02/14 DOA: 03/31/2015 PCP: Clydine Parkison A., MD  Assessment/Plan: Active Problems:   Dehydration   Jaundice, hemolytic   Frequent headaches   Hb-SS disease with crisis   Asthma, chronic  1. Hb SS with crisis: Pt has been in mild acute crisis for about 1 weeek. She was transfused 1 Unit of blood and hydrated as an out patient and still continued to have symptoms of pain. [facility] and admitted to the hospital for uncontrolled pain. She reports that the pain is localized to her suprapubic region and back. She also reports that the Percocet is not effective. We have discussed her pain and the plan will be to start PCA at full dose, discontinue Percocet and re-evaluate in a few hours. Continue Toradol and IVF.  2. Elevated Alkaline Phosphatase and AST: Pt has persistently elevated liver enzymes. She has had her gallbladder removed previously (age 29) and currently has no complaints in the RUQ. Will obtain hepatitis panel and consider Ultrasound RUQ. 3. Leukocytosis: Pt has a mild leukocytosis without evidence of infection. However she has a fever last week which may have reflected a viral syndrome. Her CXR, U/A and blood cultures from last week were all negative. 4. Acute Hemolytic Anemia: Hb dropped to a nadir of 5.8 with significantly elevated LDH. The LDH is trending down and Hb is still more than 2 grams below baseline of 9-10. However will observe for now as patient is hemodynamically stable and she has significant antibodies thus on balance more prudent to observe. 5. Chronic pain: Pt uses very little analgesic medication.  6. Sickle Cell Disease: Pt has been at a dose of 16 mg/kg for more than 2 years. She has been resistant to increasing dose. However she is now amenable to increasing the dose to an alternating dose of 1500 mg and 1000 mg on a every other day basis once Hb  recovers. 7. 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 She is followed by Dr. [facility] and her visits are now spaced out to yearly surveillance instead of every 6 months.  Code Status: Full Code Family Communication: N/A Disposition Plan: Not yet ready for discharge  Berdine Rasmusson A.  Pager [phone]. If 7PM-7AM, please contact night-coverage.  04/10/2015, 10:19 AM  LOS: 1 day    Consultants:  None  Procedures:  None  Antibiotics:  None  HPI/Subjective: Pain improved since yesterday. Localized to back.   Objective: Filed Vitals:   04/03/2015 2053 04/10/15 0201 04/10/15 0523 04/10/15 0757  BP: 118/70 116/68 114/78   Pulse: 83 96 95   Temp: 98.5 F (36.9 C) 99.9 F (37.7 C) 98.2 F (36.8 C)   TempSrc: Oral Oral Oral   Resp: 18 18 18    Height:      Weight:   138 lb 11.2 oz (62.914 kg)   SpO2: 100% 100% 97% 94%   Weight change:   Intake/Output Summary (Last 24 hours) at 04/10/15 1019 Last data filed at 04/10/15 0700  Gross per 24 hour  Intake   2168 ml  Output      0 ml  Net   2168 ml    General: Alert, awake, oriented x3, in no acute distress.  HEENT: /AT PEERL, EOMI, moderate icterus Neck: Trachea midline,  no masses, no thyromegal,y no JVD, no carotid bruit OROPHARYNX:  Moist, No exudate/ erythema/lesions.  Heart: Regular rate and rhythm,  without murmurs, rubs, gallops, PMI non-displaced, no heaves or thrills on palpation.  Lungs: Clear to auscultation, no wheezing or rhonchi noted. No increased vocal fremitus resonant to percussion  Abdomen: Soft, nontender, nondistended, positive bowel sounds, no masses no hepatosplenomegaly noted. Neuro: No focal neurological deficits noted cranial nerves II through XII grossly intact. Strength normal in bilateral upper and lower extremities. Musculoskeletal: No warm swelling or erythema around joints, no spinal tenderness noted. Psychiatric: Patient alert and oriented x3, good insight  and cognition, good recent to remote recall. Lymph node survey: No cervical axillary or inguinal lymphadenopathy noted.   Data Reviewed: Basic Metabolic Panel:  Recent Labs Lab 04/03/15 1411 04/05/2015 1446 04/11/2015 1515 04/10/15 0805  NA 130* 133* 135 132*  K 3.7 3.4* 3.4* 3.4*  CL 104 112* 105 108  CO2 17* 14* 14* 14*  GLUCOSE 97 95 102* 86  BUN CREATININE 0.85 0.76 1.73* 0.49  CALCIUM 8.4 7.7* 8.4 7.7*   Liver Function Tests:  Recent Labs Lab 04/13/2015 1446 04/10/15 0805  AST 223* 206*  ALT 81* 71*  ALKPHOS 353* 364*  BILITOT 27.5* 28.4*  PROT 6.2* 5.9*  ALBUMIN 2.0* 1.9*   No results for input(s): LIPASE, AMYLASE in the last 168 hours. No results for input(s): AMMONIA in the last 168 hours. CBC:  Recent Labs Lab 04/03/15 1411 03/31/2015 1446 04/17/2015 1515 04/10/15 0805  WBC 8.7 13.7* 10.4 12.6*  NEUTROABS 5.0 8.2*  --  7.8*  HGB 6.8* 5.8* 6.9* 6.8*  HCT 18.8* 16.1* 19.9* 19.9*  MCV 102.7* 101.9* 104.7* 98.0  PLT 148* 176 212 190   Cardiac Enzymes: No results for input(s): CKTOTAL, CKMB, CKMBINDEX, TROPONINI in the last 168 hours. BNP (last 3 results) No results for input(s): BNP in the last 8760 hours.  ProBNP (last 3 results) No results for input(s): PROBNP in the last 8760 hours.  CBG: No results for input(s): GLUCAP in the last 168 hours.  Recent Results (from the past 240 hour(s))  Culture, blood (single)     Status: None   Collection Time: 04/03/15  3:26 PM  Result Value Ref Range Status   Organism ID, Bacteria NO GROWTH 5 DAYS  Final     Studies: Dg Chest 2 View  04/04/2015   CLINICAL DATA:  Shortness of breath.  Sickle cell anemia .  EXAM: CHEST  2 VIEW  COMPARISON:  07/23/2013.  FINDINGS: Mediastinum and hilar structures are normal. Mild interstitial prominence is stable. No focal pulmonary infiltrate noted. Mild cardiomegaly and pulmonary vascular prominence. No pleural effusion or pneumothorax. Cholecystectomy.  IMPRESSION:  1. Mild cardiomegaly and pulmonary vascular prominence. 2. Mild interstitial prominence is again noted. This may be chronic. No evidence of focal pulmonary infiltrate or overt pulmonary edema. No pleural effusion or pneumothorax.   Electronically Signed   By: Maisie Fus  Register   On: 04/04/2015 08:34    Scheduled Meds: . sodium chloride   Intravenous Once  . enoxaparin (LOVENOX) injection  40 mg Subcutaneous Q24H  . fluticasone  1 spray Each Nare Daily  . folic acid  1 mg Oral Daily  . HYDROmorphone PCA 0.3 mg/mL   Intravenous 6 times per day  . ketorolac  30 mg Intravenous 4 times per day  . mometasone-formoterol  2 puff Inhalation BID  . senna-docusate  1 tablet Oral QHS  . topiramate  50 mg Oral Q12H   Continuous Infusions: . dextrose 5 % and 0.45% NaCl 100 mL/hr at 04/10/15 0700  Time spent 40 minutes.

## 2015-04-10 NOTE — Progress Notes (Addendum)
PCA was ordered for pt this AM. As of now, pt has not used the PCA for medication and rates her pain a 2/10. Pt encouraged to use PCA if needed and was educated on its proper use.

## 2015-04-11 ENCOUNTER — Inpatient Hospital Stay (HOSPITAL_COMMUNITY): Payer: Medicaid Other

## 2015-04-11 DIAGNOSIS — E876 Hypokalemia: Secondary | ICD-10-CM

## 2015-04-11 LAB — PREPARE RBC (CROSSMATCH)

## 2015-04-11 LAB — CBC WITH DIFFERENTIAL/PLATELET
Band Neutrophils: 0 % (ref 0–10)
Basophils Absolute: 0 10*3/uL (ref 0.0–0.1)
Basophils Relative: 0 % (ref 0–1)
Blasts: 0 %
EOS PCT: 0 % (ref 0–5)
Eosinophils Absolute: 0 10*3/uL (ref 0.0–0.7)
HEMATOCRIT: 18.9 % — AB (ref 36.0–46.0)
Hemoglobin: 6.8 g/dL — CL (ref 12.0–15.0)
LYMPHS PCT: 36 % (ref 12–46)
Lymphs Abs: 5.4 10*3/uL — ABNORMAL HIGH (ref 0.7–4.0)
MCH: 35.1 pg — ABNORMAL HIGH (ref 26.0–34.0)
MCHC: 36 g/dL (ref 30.0–36.0)
MCV: 97.4 fL (ref 78.0–100.0)
MYELOCYTES: 0 %
Metamyelocytes Relative: 0 %
Monocytes Absolute: 0.7 10*3/uL (ref 0.1–1.0)
Monocytes Relative: 5 % (ref 3–12)
NEUTROS PCT: 59 % (ref 43–77)
NRBC: 30 /100{WBCs} — AB
Neutro Abs: 8.8 10*3/uL — ABNORMAL HIGH (ref 1.7–7.7)
OTHER: 0 %
PLATELETS: 176 10*3/uL (ref 150–400)
Promyelocytes Absolute: 0 %
RBC: 1.94 MIL/uL — ABNORMAL LOW (ref 3.87–5.11)
RDW: 23.6 % — ABNORMAL HIGH (ref 11.5–15.5)
WBC: 14.9 10*3/uL — AB (ref 4.0–10.5)

## 2015-04-11 LAB — BASIC METABOLIC PANEL
Anion gap: 5 (ref 5–15)
BUN: 13 mg/dL (ref 6–20)
CHLORIDE: 115 mmol/L — AB (ref 101–111)
CO2: 14 mmol/L — ABNORMAL LOW (ref 22–32)
Calcium: 7.9 mg/dL — ABNORMAL LOW (ref 8.9–10.3)
Creatinine, Ser: 0.49 mg/dL (ref 0.44–1.00)
GFR calc Af Amer: >60 mL/min (ref >60)
GLUCOSE: 89 mg/dL (ref 65–99)
POTASSIUM: 3.4 mmol/L — AB (ref 3.5–5.1)
Sodium: 134 mmol/L — ABNORMAL LOW (ref 135–145)

## 2015-04-11 LAB — LACTATE DEHYDROGENASE: LDH: 1039 U/L — ABNORMAL HIGH (ref 98–192)

## 2015-04-11 MED ORDER — SODIUM CHLORIDE 0.9 % IV SOLN: Freq: Once | INTRAVENOUS | Status: DC

## 2015-04-11 MED ORDER — SODIUM CHLORIDE 0.9 % IV BOLUS (SEPSIS)
1000.0000 mL | Freq: Once | INTRAVENOUS | Status: AC
  Administered 2015-04-11: 1000 mL via INTRAVENOUS

## 2015-04-11 MED ORDER — SODIUM CHLORIDE 0.9 % IV SOLN
INTRAVENOUS | Status: DC
  Administered 2015-04-11 – 2015-04-14 (×7): via INTRAVENOUS
  Filled 2015-04-11 (×10): qty 1000

## 2015-04-11 NOTE — Progress Notes (Signed)
Oral Chemotherapy Policy - Hydrea  Hydroxyurea (Hydrea) hold criteria:  ANC < 2  Pltc < 80K in sickle-cell patients; < 100K in other patients  Hgb <= 6 in sickle-cell patients; < 8 in other patients  Reticulocytes < 80K when Hgb < 9  Labs: hgb 6.8  A/P: Hydrea held on 6/20 due to Hgb of 5.8. Hgb now > 6 so Hydrea can be resumed at Md discretion. Will continue to monitor along with you  Hessie Knows, PharmD, BCPS Pager [phone] 04/11/2015 10:02 AM

## 2015-04-11 NOTE — Progress Notes (Signed)
04/11/15  6256  Called ultrasound spoke with [facility], patient is on the schedule for this morning. Pt has been NPO since midnight.

## 2015-04-11 NOTE — Progress Notes (Signed)
04/11/15  1020  Misty from radiology called stating that their tech has to go to another campus for a stat order and gave the patient the option to eat now and test will be done today around 1800 or wait and stay NPO but they not sure when they would be able to do her ultrasound. Pt decided to eat now and have test done this evening.

## 2015-04-12 ENCOUNTER — Inpatient Hospital Stay (HOSPITAL_COMMUNITY): Payer: Medicaid Other

## 2015-04-12 DIAGNOSIS — K745 Biliary cirrhosis, unspecified: Secondary | ICD-10-CM

## 2015-04-12 DIAGNOSIS — K7689 Other specified diseases of liver: Secondary | ICD-10-CM

## 2015-04-12 DIAGNOSIS — R160 Hepatomegaly, not elsewhere classified: Secondary | ICD-10-CM

## 2015-04-12 DIAGNOSIS — K769 Liver disease, unspecified: Secondary | ICD-10-CM

## 2015-04-12 DIAGNOSIS — R52 Pain, unspecified: Secondary | ICD-10-CM

## 2015-04-12 DIAGNOSIS — J4540 Moderate persistent asthma, uncomplicated: Secondary | ICD-10-CM

## 2015-04-12 DIAGNOSIS — K7460 Unspecified cirrhosis of liver: Secondary | ICD-10-CM

## 2015-04-12 LAB — CBC WITH DIFFERENTIAL/PLATELET
BASOS ABS: 0 10*3/uL (ref 0.0–0.1)
BLASTS: 0 %
Band Neutrophils: 0 % (ref 0–10)
Basophils Relative: 0 % (ref 0–1)
EOS PCT: 1 % (ref 0–5)
Eosinophils Absolute: 0.2 10*3/uL (ref 0.0–0.7)
HCT: 18 % — ABNORMAL LOW (ref 36.0–46.0)
Hemoglobin: 6.4 g/dL — CL (ref 12.0–15.0)
Lymphocytes Relative: 29 % (ref 12–46)
Lymphs Abs: 4.7 10*3/uL — ABNORMAL HIGH (ref 0.7–4.0)
MCH: 35.2 pg — AB (ref 26.0–34.0)
MCHC: 35.6 g/dL (ref 30.0–36.0)
MCV: 98.9 fL (ref 78.0–100.0)
MYELOCYTES: 0 %
Metamyelocytes Relative: 0 %
Monocytes Absolute: 1.8 10*3/uL — ABNORMAL HIGH (ref 0.1–1.0)
Monocytes Relative: 11 % (ref 3–12)
NEUTROS ABS: 9.6 10*3/uL — AB (ref 1.7–7.7)
NEUTROS PCT: 59 % (ref 43–77)
NRBC: 24 /100{WBCs} — AB
OTHER: 0 %
PLATELETS: 172 10*3/uL (ref 150–400)
PROMYELOCYTES ABS: 0 %
RBC: 1.82 MIL/uL — ABNORMAL LOW (ref 3.87–5.11)
RDW: 25 % — AB (ref 11.5–15.5)
WBC: 16.3 10*3/uL — ABNORMAL HIGH (ref 4.0–10.5)

## 2015-04-12 LAB — COMPREHENSIVE METABOLIC PANEL
ALBUMIN: 1.7 g/dL — AB (ref 3.5–5.0)
ALK PHOS: 498 U/L — AB (ref 38–126)
ALT: 76 U/L — AB (ref 14–54)
ALT: 88 U/L — AB (ref 14–54)
AST: 233 U/L — AB (ref 15–41)
AST: 274 U/L — AB (ref 15–41)
Albumin: 1.7 g/dL — ABNORMAL LOW (ref 3.5–5.0)
Alkaline Phosphatase: 474 U/L — ABNORMAL HIGH (ref 38–126)
Anion gap: 7 (ref 5–15)
Anion gap: 8 (ref 5–15)
BILIRUBIN TOTAL: 30.4 mg/dL — AB (ref 0.3–1.2)
BILIRUBIN TOTAL: 33.5 mg/dL — AB (ref 0.3–1.2)
BUN: 12 mg/dL (ref 6–20)
BUN: 13 mg/dL (ref 6–20)
CO2: 15 mmol/L — AB (ref 22–32)
CO2: 15 mmol/L — ABNORMAL LOW (ref 22–32)
Calcium: 8 mg/dL — ABNORMAL LOW (ref 8.9–10.3)
Calcium: 8.2 mg/dL — ABNORMAL LOW (ref 8.9–10.3)
Chloride: 116 mmol/L — ABNORMAL HIGH (ref 101–111)
Chloride: 114 mmol/L — ABNORMAL HIGH (ref 101–111)
Creatinine, Ser: 0.4 mg/dL — ABNORMAL LOW (ref 0.44–1.00)
Creatinine, Ser: <0.3 mg/dL — ABNORMAL LOW (ref 0.44–1.00)
GFR calc Af Amer: >60 mL/min (ref >60)
Glucose, Bld: 78 mg/dL (ref 65–99)
Glucose, Bld: 90 mg/dL (ref 65–99)
Potassium: 4 mmol/L (ref 3.5–5.1)
Potassium: 4.1 mmol/L (ref 3.5–5.1)
SODIUM: 138 mmol/L (ref 135–145)
Sodium: 137 mmol/L (ref 135–145)
Total Protein: 5.7 g/dL — ABNORMAL LOW (ref 6.5–8.1)
Total Protein: 6.1 g/dL — ABNORMAL LOW (ref 6.5–8.1)

## 2015-04-12 LAB — HEPATITIS C ANTIBODY (REFLEX)

## 2015-04-12 LAB — PREPARE RBC (CROSSMATCH)

## 2015-04-12 LAB — APTT: aPTT: 47 seconds — ABNORMAL HIGH (ref 24–37)

## 2015-04-12 LAB — RETICULOCYTES
RBC.: 1.81 MIL/uL — ABNORMAL LOW (ref 3.87–5.11)
RETIC COUNT ABSOLUTE: 340.3 10*3/uL — AB (ref 19.0–186.0)
RETIC CT PCT: 18.8 % — AB (ref 0.4–3.1)

## 2015-04-12 LAB — HEPATITIS B SURFACE ANTIGEN: Hepatitis B Surface Ag: NEGATIVE

## 2015-04-12 LAB — PROTIME-INR
INR: 1.28 (ref 0.00–1.49)
PROTHROMBIN TIME: 16.2 s — AB (ref 11.6–15.2)

## 2015-04-12 LAB — DIRECT ANTIGLOBULIN TEST (NOT AT ARMC)
DAT, IgG: NEGATIVE
DAT, complement: NEGATIVE

## 2015-04-12 LAB — LACTATE DEHYDROGENASE: LDH: 1122 U/L — ABNORMAL HIGH (ref 98–192)

## 2015-04-12 LAB — HCV COMMENT:

## 2015-04-12 MED ORDER — GADOBENATE DIMEGLUMINE 529 MG/ML IV SOLN
15.0000 mL | Freq: Once | INTRAVENOUS | Status: AC | PRN
Start: 2015-04-12 — End: 2015-04-12
  Administered 2015-04-12: 11 mL via INTRAVENOUS

## 2015-04-12 MED ORDER — SODIUM CHLORIDE 0.9 % IV SOLN
Freq: Once | INTRAVENOUS | Status: AC
  Administered 2015-04-12: 18:00:00 via INTRAVENOUS

## 2015-04-12 NOTE — Consult Note (Signed)
[facility]  [phone] [phone]  Inpatient New consult Note   Patient Care Team: Altha Harm, MD as PCP - General (Internal Medicine) 04/12/2015  Referring physician: Dr. Marlana Salvage  CHIEF COMPLAINTS/PURPOSE OF CONSULTATION:  Sickle cell disease and hemolytic anemia  HISTORY OF PRESENTING ILLNESS:  Joan Martin 29 y.o. female with sickle cell SS disease, history of stroke 10 years ago, presented with sickle cell pain crisis and worsening hemolytic anemia. She was admitted on June 20, I was consulted for her worsening hemolytic anemia.  Her baseline hemoglobin is around 8-9, LDH in the range of 600-800. She presented with fever and pain crisis to sickle cell clinic last week, received IV fluids and pain medication,  hemoglobin was 6.7, absolute reticulocyte count was 302, he was admitted for blood transfusion. She received blood transfusion on June 20 and June 22. However her hemoglobin did not improve, t-bil was found to be elevated at 27.5 (30.4 today), LDH in the range of 1000-1122.    MEDICAL HISTORY:  Past Medical History  Diagnosis Date  . Sickle cell anemia   . Stroke     7 years ago  . Sickle cell anemia   . Asthma     SURGICAL HISTORY: Past Surgical History  Procedure Laterality Date  . Cholecystectomy      SOCIAL HISTORY: History   Social History  . Marital Status: Single    Spouse Name: N/A  . Number of Children: N/A  . Years of Education: N/A   Occupational History  . Not on file.   Social History Main Topics  . Smoking status: Never Smoker   . Smokeless tobacco: Never Used  . Alcohol Use: No  . Drug Use: No  . Sexual Activity:    Partners: Male    Birth Control/ Protection: Condom   Other Topics Concern  . Not on file   Social History Narrative    FAMILY HISTORY: Family History  Problem Relation Age of Onset  . Diabetes Mother   . Hypertension Mother   . Diabetes Father   .  Hypertension Father   . Cancer Maternal Aunt   . Sickle cell anemia Sister     ALLERGIES:  is allergic to cephalosporins; citrus; and morphine and related.  MEDICATIONS:  Current Facility-Administered Medications  Medication Dose Route Frequency Provider Last Rate Last Dose  . 0.9 %  sodium chloride infusion   Intravenous Once Olabunmi A Agboola, MD      . 0.9 %  sodium chloride infusion   Intravenous Once Altha Harm, MD      . 0.9 %  sodium chloride infusion   Intravenous Once Altha Harm, MD      . albuterol (PROVENTIL) (2.5 MG/3ML) 0.083% nebulizer solution 2.5 mg  2.5 mg Nebulization Q6H PRN Olabunmi A Agboola, MD      . diphenhydrAMINE (BENADRYL) capsule 25-50 mg  25-50 mg Oral Q6H PRN Wynetta Fines, MD   25 mg at 04/10/15 2021  . enoxaparin (LOVENOX) injection 40 mg  40 mg Subcutaneous Q24H Olabunmi A Agboola, MD   40 mg at 04/11/15 2211  . fluticasone (FLONASE) 50 MCG/ACT nasal spray 1 spray  1 spray Each Nare Daily Olabunmi A Agboola, MD   1 spray at 04/12/15 1024  . folic acid (FOLVITE) tablet 1 mg  1 mg Oral Daily Olabunmi A Agboola, MD   1 mg at 04/12/15 1023  . HYDROmorphone (DILAUDID) PCA injection 0.3 mg/mL  Intravenous 6 times per day Altha Harm, MD   0.3 mg at 04/10/15 1049  . ketorolac (TORADOL) 30 MG/ML injection 30 mg  30 mg Intravenous 4 times per day Wynetta Fines, MD   30 mg at 04/12/15 1310  . mometasone-formoterol (DULERA) 100-5 MCG/ACT inhaler 2 puff  2 puff Inhalation BID Wynetta Fines, MD   2 puff at 04/12/15 0950  . naloxone [facility]) injection 0.4 mg  0.4 mg Intravenous PRN Altha Harm, MD       And  . sodium chloride 0.9 % injection 9 mL  9 mL Intravenous PRN Altha Harm, MD      . ondansetron [facility]) injection 4 mg  4 mg Intravenous Q6H PRN Altha Harm, MD      . polyethylene glycol (MIRALAX / GLYCOLAX) packet 17 g  17 g Oral BID PRN Olabunmi A Agboola, MD      . promethazine (PHENERGAN) tablet  25 mg  25 mg Oral Q6H PRN Olabunmi A Agboola, MD      . senna-docusate (Senokot-S) tablet 1 tablet  1 tablet Oral QHS Wynetta Fines, MD   1 tablet at 04/11/15 2211  . sodium chloride 0.9 % 1,000 mL with potassium chloride 20 mEq infusion   Intravenous Continuous Altha Harm, MD 100 mL/hr at 04/12/15 1150    . topiramate (TOPAMAX) tablet 50 mg  50 mg Oral Q12H Olabunmi A Agboola, MD   50 mg at 04/12/15 1023   Facility-Administered Medications Ordered in Other Encounters  Medication Dose Route Frequency Provider Last Rate Last Dose  . ondansetron (ZOFRAN) injection 4 mg  4 mg Intravenous Once Grayce Sessions, NP        REVIEW OF SYSTEMS:   Constitutional: Denies fevers, chills or abnormal night sweats, (+) fatigue and body pain Eyes: Denies blurriness of vision, double vision or watery eyes Ears, nose, mouth, throat, and face: Denies mucositis or sore throat Respiratory: Denies cough, dyspnea or wheezes Cardiovascular: Denies palpitation, chest discomfort or lower extremity swelling Gastrointestinal:  Denies nausea, heartburn or change in bowel habits Skin: Denies abnormal skin rashes Lymphatics: Denies new lymphadenopathy or easy bruising Neurological:Denies numbness, tingling or new weaknesses Behavioral/Psych: Mood is stable, no new changes  All other systems were reviewed with the patient and are negative.  PHYSICAL EXAMINATION:  Filed Vitals:   04/12/15 1151  BP:   Pulse:   Temp:   Resp: 16   Filed Weights   04/10/15 0523 04/11/15 0522 04/12/15 0453  Weight: 138 lb 11.2 oz (62.914 kg) 126 lb 3.2 oz (57.244 kg) 125 lb 14.4 oz (57.108 kg)    GENERAL:alert, no distress and comfortable SKIN: skin color, texture, turgor are normal, no rashes or significant lesions EYES: normal, conjunctiva are pink and non-injected, (+) jaundice OROPHARYNX:no exudate, no erythema and lips, buccal mucosa, and tongue normal  NECK: supple, thyroid normal size, non-tender, without  nodularity LYMPH:  no palpable lymphadenopathy in the cervical, axillary or inguinal LUNGS: clear to auscultation and percussion with normal breathing effort HEART: regular rate & rhythm and no murmurs and no lower extremity edema ABDOMEN:abdomen  Slightly distended, mild tenderness at the right upper quadrant and left upper quadrant of abdomen. Slightly enlarged liver.  normal bowel sounds Musculoskeletal:no cyanosis of digits and no clubbing  PSYCH: alert & oriented x 3 with fluent speech NEURO: no focal motor/sensory deficits  LABORATORY DATA:  I have reviewed the data as listed CBC Latest Ref Rng 04/12/2015 04/11/2015 04/10/2015  WBC 4.0 - 10.5 K/uL 16.3(H) 14.9(H) 12.6(H)  Hemoglobin 12.0 - 15.0 g/dL 6.4(LL) 6.8(LL) 6.8(LL)  Hematocrit 36.0 - 46.0 % 18.0(L) 18.9(L) 19.9(L)  Platelets 150 - 400 K/uL 172 176 190    CMP Latest Ref Rng 04/12/2015 04/11/2015 04/10/2015  Glucose 65 - 99 mg/dL 78 89 86  BUN 6 - 20 mg/dL Creatinine 0.44 - 1.00 mg/dL 1.61(W) 9.60 4.54  Sodium 135 - 145 mmol/L 138 134(L) 132(L)  Potassium 3.5 - 5.1 mmol/L 4.0 3.4(L) 3.4(L)  Chloride 101 - 111 mmol/L 116(H) 115(H) 108  CO2 22 - 32 mmol/L 15(L) 14(L) 14(L)  Calcium 8.9 - 10.3 mg/dL 8.0(L) 7.9(L) 7.7(L)  Total Protein 6.5 - 8.1 g/dL 0.9(W) - 5.9(L)  Total Bilirubin 0.3 - 1.2 mg/dL 30.4(HH) - 28.4(HH)  Alkaline Phos 38 - 126 U/L 474(H) - 364(H)  AST 15 - 41 U/L 233(H) - 206(H)  ALT 14 - 54 U/L 76(H) - 71(H)     RADIOGRAPHIC STUDIES: I have personally reviewed the radiological images as listed and agreed with the findings in the report. Dg Chest 2 View  04/04/2015   CLINICAL DATA:  Shortness of breath.  Sickle cell anemia .  EXAM: CHEST  2 VIEW  COMPARISON:  07/23/2013.  FINDINGS: Mediastinum and hilar structures are normal. Mild interstitial prominence is stable. No focal pulmonary infiltrate noted. Mild cardiomegaly and pulmonary vascular prominence. No pleural effusion or pneumothorax.  Cholecystectomy.  IMPRESSION: 1. Mild cardiomegaly and pulmonary vascular prominence. 2. Mild interstitial prominence is again noted. This may be chronic. No evidence of focal pulmonary infiltrate or overt pulmonary edema. No pleural effusion or pneumothorax.   Electronically Signed   By: Maisie Fus  Register   On: 04/04/2015 08:34   Mr Abdomen W Wo Contrast  04/12/2015   CLINICAL DATA:  29 year old female with history of sickle cell disease. Liver mass noted on recent ultrasound examination.  EXAM: MRI ABDOMEN WITHOUT AND WITH CONTRAST  TECHNIQUE: Multiplanar multisequence MR imaging of the abdomen was performed both before and after the administration of intravenous contrast.  CONTRAST:  11mL MULTIHANCE GADOBENATE DIMEGLUMINE 529 MG/ML IV SOLN  COMPARISON:  No priors.  FINDINGS: Lower chest:  Unremarkable.  Hepatobiliary: The liver is enlarged measuring 21.9 cm in craniocaudal span. The liver has a markedly nodular contour, compatible with underlying cirrhosis. There is a macronodular area of the right lobe of the liver which is somewhat mass-like in appearance, however, this area is isointense to the remaining portions of the hepatic parenchyma on all pulse sequences, and enhances identically to the remaining hepatic parenchyma on post gadolinium images. Additionally, this appears to have normal vascular supply, without obvious distortion of the vessels around a mass. On arterial phase post gadolinium images, no hypervascular lesion is identified at this time. No intra or extrahepatic biliary ductal dilatation. Status post cholecystectomy.  Pancreas: No pancreatic mass. No pancreatic ductal dilatation. No pancreatic or peripancreatic fluid or inflammatory changes.  Spleen: The spleen is not visualized. 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  Adrenals/Urinary Tract:  Bilateral adrenal glands are normal in appearance. Sub cm lesions in the kidneys bilaterally are low T1 signal intensity, high T2 signal intensity, and do not enhance, compatible with tiny cysts. No hydroureteronephrosis in the visualized abdomen.  Stomach/Bowel: Visualized portions are unremarkable.  Vascular/Lymphatic: Mild dilatation of the portal vein which measures up to 15 mm  in diameter proximally. No aneurysm identified in the visualized abdominal vasculature. No lymphadenopathy noted in the abdomen.  Other: Small volume of ascites.  Musculoskeletal: No aggressive osseous lesions noted in the visualized portions of the skeleton.  IMPRESSION: 1. The liver is enlarged, and there are morphologic changes compatible with underlying cirrhosis. The perceived mass in the right lobe of the liver corresponds to an usually macrolobulated portion of the hepatic parenchyma. This is an unusual but benign finding. No hypervascular lesion to suggest hepatocellular carcinoma at this time. 2. 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 3. 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   On: 04/12/2015 16:23   US Abdomen Limited  04/11/2015   CLINICAL DATA:  Elevated liver function tests. Sickle cell disease. Prior cholecystectomy.  EXAM: US ABDOMEN LIMITED - RIGHT UPPER QUADRANT  COMPARISON:  None.  FINDINGS: Gallbladder:  Removed.  Common bile duct:  Diameter: 3.4 mm, normal.  Liver:  There is a masslike lesion measuring 9.6 x 9.0 x 6.4 cm in the right lobe. The echogenicity is similar to liver but the mass appears to have a relatively definable margin. There is slight nodularity of the surface of the liver.  There is small amount of ascites in the abdomen.  IMPRESSION: Findings are suggestive of cirrhosis. Possible 9.6 cm mass in the right lobe of the liver. Ascites.   Electronically Signed   By:  Francene Boyers M.D.   On: 04/11/2015 19:00    ASSESSMENT & PLAN:   29 year old African-American female, who is known history of sickle cell SS disease, presented with worsening anemia and pain crisis.   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 Coombs test was negative, no evidence of auto immune hemolysis.  -Her hemoglobin did not improve after 2 units of blood transfusion, transfusion related hemolysis is also possible.  -I suspect her significantly elevated bilirubin is partially rich in her liver disease.  Consider check direct bilirubin  -I recommend exchange blood transfusion to decrease sickle cell related hemolysis process.  -Please check B12 level, and folic acid level, to ruled out effusion anemia. She is taking folic acid, but not on B12 supplement.   2. Liver cirrhosis and liver lesion -Her liver MRI reviewed evidence of liver cirrhosis, the liver lesion appears to be benign on the MRI, no imaging evidence of HCC. -The underlying etiology of 4 liver cirrhosis is currently being evaluated by gastroenterologist Dr. Leone Payor -Repeat iron level and a ferritin, she may have iron overload from her previous multiple blood transfusion. Iron chelating therapy may be needed if the other workup for liver cirrhosis is negative.  I'll follow up when she is in house. Thank you for the opportunity to participate in her care.  All questions were answered. The patient knows to call the clinic with any problems, questions or concerns. I spent 30 minutes counseling the patient face to face. The total time spent in the appointment was 40 minutes and more than 50% was on counseling.     Malachy Mood, MD @T @ 3:02 PM

## 2015-04-12 NOTE — Progress Notes (Signed)
SICKLE CELL SERVICE PROGRESS NOTE  Joan Martin KWI:[id number] DOB: [birth year] DOA: 03/27/2015 PCP: Jeramiah Mccaughey A., MD  Assessment/Plan: Active Problems:   Dehydration   Jaundice, hemolytic   Frequent headaches   Hb-SS disease with crisis   Asthma, chronic   1. Elevated Alkaline Phosphatase and AST: Liver enzymes continue to increase. She still has no abdominal complaints and denies any increase in abdominal girth over time. I have reviewed the U/S and noted findings of Liver mass. I will obtain an MRI with contrast to further define the mass. Will also obtain an AFP level. I have also consulted Gastroenterology. Pt still without abdominal symptoms. 2. 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 I will check DAT and  transfuse 1 unit RBC' s however she may require complete Red Cell Pheresis due to hyperhemolysis.  I will consult Hematology. 3. Hb SS with crisis: Pt has zero pain today and reports that her pain is well controlled with Toradol with only minimal use on the PCA.  However she will be NPO for MRI later today so will leave the PCA for now and transition to oral analgesics once U/S is completed.  Continue Toradol and IVF.  4. Leukocytosis: Pt has a mild leukocytosis without evidence of infection. However she has a fever last week which may have reflected a viral syndrome. Her CXR, U/A and blood cultures from last week were all negative. 5. Chronic pain: Pt uses very little analgesic medication.  6. Sickle Cell Disease: Pt has been at a dose of 16 mg/kg for more than 2 years. She has been resistant to increasing dose. However she is now amenable to increasing the dose to an alternating dose of 1500 mg and 1000 mg on a every other day basis once Hb recovers. 7. 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 She is followed by Dr. [facility] and her visits are now spaced out to yearly surveillance instead of every 6 months.  Code Status: Full Code Family  Communication: Had in-depth discussion with patient and father Disposition Plan: Not yet ready for discharge  Adamari Frede A.  Pager [phone]. If 7PM-7AM, please contact night-coverage.  04/12/2015, 10:16 AM  LOS: 3 days    Consultants:  Gastroenterology  Hematology  Procedures:  None  Antibiotics:  None  HPI/Subjective: Pain improved since yesterday. Now currently at 0/10  Objective: Filed Vitals:   04/11/15 0201 04/11/15 0501 04/11/15 0801  BP: 120/62 115/52   Pulse: 102 91   Temp: 98.7 F (37.1 C) 98.7 F (37.1 C)   TempSrc: Oral Oral   Resp: 16 20 18   Height:     Weight:  125 lb 14.4 oz (57.108 kg)   SpO2: 96% 97% 98%   Weight change: -4.8 oz (-0.136 kg)  Intake/Output Summary (Last 24 hours) at 04/12/15 1016 Last data filed at 04/12/15 1015  Gross per 24 hour  Intake 3329.83 ml  Output   1175 ml  Net 2154.83 ml    General: Alert, awake, oriented x3, in no acute distress.  HEENT: [place]/AT PEERL, EOMI, with increased hemolysis.. Neck: Trachea midline,  no masses, no thyromegal,y no JVD, no carotid bruit OROPHARYNX:  Moist, No exudate/ erythema/lesions.  Heart: Regular rate and rhythm, without murmurs, rubs, gallops, PMI non-displaced, no heaves or thrills on palpation.  Lungs: Clear to auscultation, no wheezing or rhonchi noted. No increased vocal fremitus resonant to percussion  Abdomen: Soft, nontender, nondistended, positive bowel sounds, no masses mild hepatomegaly noted. Neuro: No  focal neurological deficits noted cranial nerves II through XII grossly intact. Strength normal in bilateral upper and lower extremities. Musculoskeletal: No warm swelling or erythema around joints, no spinal tenderness noted. Psychiatric: Patient alert and oriented x3, good insight and cognition, good recent to remote recall.    Data Reviewed: Basic Metabolic Panel:  Recent Labs Lab 04/08/2015 1446 03/24/2015 1515 04/10/15 0805 04/11/15 0435  NA 133* 135 132*  134*  K 3.4* 3.4* 3.4* 3.4*  CL 112* 105 108 115*  CO2 14* 14* 14* 14*  GLUCOSE 95 102* 86 89  BUN CREATININE 0.76 1.73* 0.49 0.49  CALCIUM 7.7* 8.4 7.7* 7.9*   Liver Function Tests:  Recent Labs Lab 04/14/2015 1446 04/10/15 0805  AST 223* 206*  ALT 81* 71*  ALKPHOS 353* 364*  BILITOT 27.5* 28.4*  PROT 6.2* 5.9*  ALBUMIN 2.0* 1.9*   No results for input(s): LIPASE, AMYLASE in the last 168 hours. No results for input(s): AMMONIA in the last 168 hours. CBC:  Recent Labs Lab 03/29/2015 1446 04/05/2015 1515 04/10/15 0805 04/11/15 0435  WBC 13.7* 10.4 12.6* 14.9*  NEUTROABS 8.2*  --  7.8* 8.8*  HGB 5.8* 6.9* 6.8* 6.8*  HCT 16.1* 19.9* 19.9* 18.9*  MCV 101.9* 104.7* 98.0 97.4  PLT 176 212 190 176   Cardiac Enzymes: No results for input(s): CKTOTAL, CKMB, CKMBINDEX, TROPONINI in the last 168 hours. BNP (last 3 results) No results for input(s): BNP in the last 8760 hours.  ProBNP (last 3 results) No results for input(s): PROBNP in the last 8760 hours.  CBG: No results for input(s): GLUCAP in the last 168 hours.  Recent Results (from the past 240 hour(s))  Culture, blood (single)     Status: None   Collection Time: 04/03/15  3:26 PM  Result Value Ref Range Status   Organism ID, Bacteria NO GROWTH 5 DAYS  Final     Studies: Dg Chest 2 View  04/04/2015   CLINICAL DATA:  Shortness of breath.  Sickle cell anemia .  EXAM: CHEST  2 VIEW  COMPARISON:  07/23/2013.  FINDINGS: Mediastinum and hilar structures are normal. Mild interstitial prominence is stable. No focal pulmonary infiltrate noted. Mild cardiomegaly and pulmonary vascular prominence. No pleural effusion or pneumothorax. Cholecystectomy.  IMPRESSION: 1. Mild cardiomegaly and pulmonary vascular prominence. 2. Mild interstitial prominence is again noted. This may be chronic. No evidence of focal pulmonary infiltrate or overt pulmonary edema. No pleural effusion or pneumothorax.   Electronically Signed   By:  Maisie Fus  Register   On: 04/04/2015 08:34     Scheduled Meds: . sodium chloride   Intravenous Once  . sodium chloride   Intravenous Once  . enoxaparin (LOVENOX) injection  40 mg Subcutaneous Q24H  . fluticasone  1 spray Each Nare Daily  . folic acid  1 mg Oral Daily  . HYDROmorphone PCA 0.3 mg/mL   Intravenous 6 times per day  . ketorolac  30 mg Intravenous 4 times per day  . mometasone-formoterol  2 puff Inhalation BID  . senna-docusate  1 tablet Oral QHS  . topiramate  50 mg Oral Q12H   Time spent 50 minutes.

## 2015-04-12 NOTE — Progress Notes (Signed)
Pt's total bilirubin is 30.4. Midlevel notified. Will cont to monitor patient.

## 2015-04-12 NOTE — Consult Note (Signed)
Consultation  Referring Provider:   Liston Alba, MD   Primary Care Physician:  MATTHEWS,MICHELLE A., MD Primary Gastroenterologist: unassigned        Reason for Consultation:  Liver mass            HPI:   Joan Martin is a 29 y.o. female with sickle cell admitted with SS crisis. She was evaluated by PCP last week for fevers. Blood culture, urine and CXR all negative. She has abnormal LFTs dating back to at least April 2014. Alk phos has been in high 300s but now at 474, AST typically not above 130, now at 233. Tbili 30 but felt to be from hemolysis.  US reveals small amount of ascites, a large lesion in right liver lobe as well as probable cirrhosis. HBsurface ag and HCV ab negative.  Patient describes recent periumbilical pain. BMs normal. Weight is stable.   Past Medical History  Diagnosis Date  . Sickle cell anemia   . Stroke     7 years ago  . Sickle cell anemia   . Asthma     Past Surgical History  Procedure Laterality Date  . Cholecystectomy      Family History  Problem Relation Age of Onset  . Diabetes Mother   . Hypertension Mother   . Diabetes Father   . Hypertension Father   . Cancer Maternal Aunt   . Sickle cell anemia Sister      History  Substance Use Topics  . Smoking status: Never Smoker   . Smokeless tobacco: Never Used  . Alcohol Use: No    Prior to Admission medications   Medication Sig Start Date End Date Taking? Authorizing Provider  albuterol (PROVENTIL) (2.5 MG/3ML) 0.083% nebulizer solution Take 3 mLs (2.5 mg total) by nebulization every 6 (six) hours as needed for shortness of breath. 12/01/13  Yes Dorena Dew, FNP  diphenhydrAMINE (BENADRYL) 25 MG tablet Take 25 mg by mouth every 6 (six) hours as needed for itching.   Yes Historical Provider, MD  Fluticasone-Salmeterol (ADVAIR) 250-50 MCG/DOSE AEPB Inhale 1 puff into the lungs daily.   Yes Historical Provider, MD  folic acid (FOLVITE) 1 MG tablet Take 1 tablet (1 mg total) by  mouth daily. 01/03/14  Yes Dorena Dew, FNP  hydroxyurea (HYDREA) 500 MG capsule TAKE ONE CAPSULE BY MOUTH TWICE DAILY   Yes Leana Gamer, MD  ibuprofen (ADVIL,MOTRIN) 600 MG tablet Take 1 tablet (600 mg total) by mouth every 8 (eight) hours as needed for pain. 07/27/13  Yes Kerin Perna, NP  mometasone (NASONEX) 50 MCG/ACT nasal spray Place 2 sprays into the nose daily. Patient taking differently: Place 2 sprays into the nose at bedtime.  11/23/14  Yes Dorena Dew, FNP  ondansetron (ZOFRAN) 4 MG tablet Take 1 tablet (4 mg total) by mouth every 6 (six) hours. Patient taking differently: Take 4 mg by mouth every 6 (six) hours as needed for nausea or vomiting.  10/19/14  Yes Dorena Dew, FNP  oxyCODONE-acetaminophen (PERCOCET) 10-325 MG per tablet Take 1 tablet every 4 hours for severe pain Patient taking differently: Take 1 tablet by mouth every 4 (four) hours as needed for pain.  02/13/15  Yes Dorena Dew, FNP  promethazine (PHENERGAN) 25 MG tablet Take 25 mg by mouth every 6 (six) hours as needed for nausea.   Yes Historical Provider, MD  topiramate (TOPAMAX) 50 MG tablet Take 0.5tab BID x7d, then 1tab BID. Patient  taking differently: Take 50 mg by mouth 2 (two) times daily.  01/17/15  Yes Pieter Partridge, DO  SUMAtriptan (IMITREX) 25 MG tablet Take 1 tablet (25 mg total) by mouth once. May repeat in 2 hours if headache persists or recurs. Not to exceed 2 doses in 24 hours 10/19/14   Dorena Dew, FNP  Vitamin D, Ergocalciferol, (DRISDOL) 50000 UNITS CAPS capsule TAKE 1 CAPSULE BY MOUTH ONCE WEEKLY 04/10/15   Dorena Dew, FNP    Current Facility-Administered Medications  Medication Dose Route Frequency Provider Last Rate Last Dose  . 0.9 %  sodium chloride infusion   Intravenous Once Olabunmi A Agboola, MD      . 0.9 %  sodium chloride infusion   Intravenous Once Leana Gamer, MD      . albuterol (PROVENTIL) (2.5 MG/3ML) 0.083% nebulizer solution 2.5 mg   2.5 mg Nebulization Q6H PRN Olabunmi A Agboola, MD      . diphenhydrAMINE (BENADRYL) capsule 25-50 mg  25-50 mg Oral Q6H PRN Doy Hutching, MD   25 mg at 04/10/15 2021  . enoxaparin (LOVENOX) injection 40 mg  40 mg Subcutaneous Q24H Olabunmi A Agboola, MD   40 mg at 04/11/15 2211  . fluticasone (FLONASE) 50 MCG/ACT nasal spray 1 spray  1 spray Each Nare Daily Olabunmi A Agboola, MD   1 spray at 04/12/15 1024  . folic acid (FOLVITE) tablet 1 mg  1 mg Oral Daily Olabunmi A Agboola, MD   1 mg at 04/12/15 1023  . HYDROmorphone (DILAUDID) PCA injection 0.3 mg/mL   Intravenous 6 times per day Leana Gamer, MD   0.3 mg at 04/10/15 1049  . ketorolac (TORADOL) 30 MG/ML injection 30 mg  30 mg Intravenous 4 times per day Doy Hutching, MD   30 mg at 04/12/15 0618  . mometasone-formoterol (DULERA) 100-5 MCG/ACT inhaler 2 puff  2 puff Inhalation BID Doy Hutching, MD   2 puff at 04/12/15 0950  . naloxone [facility]) injection 0.4 mg  0.4 mg Intravenous PRN Leana Gamer, MD       And  . sodium chloride 0.9 % injection 9 mL  9 mL Intravenous PRN Leana Gamer, MD      . ondansetron [facility]) injection 4 mg  4 mg Intravenous Q6H PRN Leana Gamer, MD      . polyethylene glycol (MIRALAX / GLYCOLAX) packet 17 g  17 g Oral BID PRN Olabunmi A Agboola, MD      . promethazine (PHENERGAN) tablet 25 mg  25 mg Oral Q6H PRN Olabunmi A Agboola, MD      . senna-docusate (Senokot-S) tablet 1 tablet  1 tablet Oral QHS Doy Hutching, MD   1 tablet at 04/11/15 2211  . sodium chloride 0.9 % 1,000 mL with potassium chloride 20 mEq infusion   Intravenous Continuous Leana Gamer, MD 100 mL/hr at 04/11/15 1130    . topiramate (TOPAMAX) tablet 50 mg  50 mg Oral Q12H Olabunmi A Agboola, MD   50 mg at 04/12/15 1023   Facility-Administered Medications Ordered in Other Encounters  Medication Dose Route Frequency Provider Last Rate Last Dose  . ondansetron (ZOFRAN) injection 4 mg  4 mg  Intravenous Once Kerin Perna, NP        Allergies as of 03/25/2015 - Review Complete 04/18/2015  Allergen Reaction Noted  . Cephalosporins  02/27/2013  . Citrus Other (See Comments) 02/02/2013  . Morphine and related Hives 02/02/2013  Review of Systems:    As per HPI, otherwise negative    Physical Exam:  Vital signs in last 24 hours: Temp:  [97.7 F (36.5 C)-98.9 F (37.2 C)] 97.7 F (36.5 C) (06/23 0942) Pulse Rate:  [89-98] 89 (06/23 0942) Resp:  [16-26] 18 (06/23 0942) BP: (104-118)/(60-75) 109/75 mmHg (06/23 0942) SpO2:  [91 %-100 %] 94 % (06/23 0950) Weight:  [125 lb 14.4 oz (57.108 kg)] 125 lb 14.4 oz (57.108 kg) (06/23 0453) Last BM Date: 04/11/15 General:   Pleasant black female in NAD Head:  Normocephalic and atraumatic. Ears:  Normal auditory acuity. Neck:  Supple Lungs:  Respirations even and unlabored. Lungs clear to auscultation bilaterally.   No wheezes, crackles, or rhonchi.  Heart:  Regular rate and rhythm Abdomen:  Soft, nondistended, nontender. Normal bowel sounds. In the mid upper abdomen there is a firm mass  Rectal:  Not performed.  Msk:  Symmetrical without gross deformities.  Extremities:  Without edema. Neurologic:  Alert and  oriented x4;  grossly normal neurologically. Skin:  Intact without significant lesions or rashes. Psych:  Alert and cooperative. Normal affect.  LAB RESULTS:  Recent Labs  04/10/15 0805 04/11/15 0435 04/12/15 0449  WBC 12.6* 14.9* 16.3*  HGB 6.8* 6.8* 6.4*  HCT 19.9* 18.9* 18.0*  PLT 190 176 172   BMET  Recent Labs  04/10/15 0805 04/11/15 0435 04/12/15 0449  NA 132* 134* 138  K 3.4* 3.4* 4.0  CL 108 115* 116*  CO2 14* 14* 15*  GLUCOSE 86 89 78  BUN _0 CREATININE 0.49 0.49 0.40*  CALCIUM 7.7* 7.9* 8.0*   LFT  Recent Labs  04/12/15 0449  PROT 5.7*  ALBUMIN 1.7*  AST 233*  ALT 76*  ALKPHOS 474*  BILITOT 30.4*    STUDIES: US Abdomen Limited  04/11/2015   CLINICAL DATA:   Elevated liver function tests. Sickle cell disease. Prior cholecystectomy.  EXAM: US ABDOMEN LIMITED - RIGHT UPPER QUADRANT  COMPARISON:  None.  FINDINGS: Gallbladder:  Removed.  Common bile duct:  Diameter: 3.4 mm, normal.  Liver:  There is a masslike lesion measuring 9.6 x 9.0 x 6.4 cm in the right lobe. The echogenicity is similar to liver but the mass appears to have a relatively definable margin. There is slight nodularity of the surface of the liver.  There is small amount of ascites in the abdomen.  IMPRESSION: Findings are suggestive of cirrhosis. Possible 9.6 cm mass in the right lobe of the liver. Ascites.   Electronically Signed   By: Lorriane Shire M.D.   On: 04/11/2015 19:00   PREVIOUS ENDOSCOPIES:            none   Impression / Plan:   16. 29 year old female with sickle cell admitted with SS crisis.   2. Progressive elevation of alk phos and AST with findings of probable cirrhosis and a large liver lesion on CTscan. Normal CBD. Total bilirubin markedly elevated but likely from hemolysis.  Patient has had several blood transfusions through the years so iron overload may have resulted in cirrhosis. Hopefully this is not [place] in setting of cirrhosis. MRI to be done today to better characterize this lesion. Will follow along. I spoke with MRI, they will do MRI with and without contrast   Thanks   LOS: 3 days   Tye Savoy  04/12/2015, 11:16 AM    [facility] Attending  I have also seen and assessed the patient and agree with  the advanced practitioner's assessment and plan. Fortunately does not have liver cancer but liver looks cirrhotic so will do additional serologic w/u.  Gatha Mayer, MD, [facility] [phone] (pager) 04/12/2015 6:04 PM

## 2015-04-12 NOTE — Progress Notes (Signed)
SICKLE CELL SERVICE PROGRESS NOTE  Joan Martin BSW:[id number] DOB: 18-Oct-1986 DOA: 04/13/2015 PCP: MATTHEWS,MICHELLE A., MD  Assessment/Plan: Active Problems:   Dehydration   Jaundice, hemolytic   Frequent headaches   Hb-SS disease with crisis   Asthma, chronic   1. Elevated Alkaline Phosphatase and AST: Pt has persistently elevated liver enzymes. She has had her gallbladder removed previously (age 29) and currently has no complaints in the RUQ. U/S and Hepatitis panel still pending. Pt still without abdominal symptoms. 2. Hb SS with crisis: Pt has zero pain today and reports that her pain is well controlled with Toradol with only minimal use on the PCA.  However she will be NPO for U/S later today so will leave the PCA for now and transition to oral analgesics once U/S is completed.  Continue Toradol and IVF.  3. Leukocytosis: Pt has a mild leukocytosis without evidence of infection. However she has a fever last week which may have reflected a viral syndrome. Her CXR, U/A and blood cultures from last week were all negative. 4. Acute Hemolytic Anemia: Hb dropped to a nadir of 5.8 with significantly elevated LDH. The LDH is trending down and Hb is still more than 2 grams below baseline of 9-10. However will observe for now as patient is hemodynamicalay stable. If further hemolysis will check for direct antibodies and consider hematology consult. 5. Chronic pain: Pt uses very little analgesic medication.  6. Sickle Cell Disease: Pt has been at a dose of 16 mg/kg for more than 2 years. She has been resistant to increasing dose. However she is now amenable to increasing the dose to an alternating dose of 1500 mg and 1000 mg on a every other day basis once Hb recovers. 7. 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 She is followed by Dr. [facility] and her visits are now spaced out to yearly surveillance instead of every 6 months.  Code Status: Full Code Family  Communication: Father at bedside and updated Disposition Plan: Not yet ready for discharge  MATTHEWS,MICHELLE A.  Pager [phone]. If 7PM-7AM, please contact night-coverage.  04/12/2015, 9:22 AM  LOS: 3 days    Consultants:  None  Procedures:  None  Antibiotics:  None  HPI/Subjective: Pain improved since yesterday. Now currently at 0/10  Objective: Filed Vitals:   04/11/15 0201 04/11/15 0501 04/11/15 0801  BP: 120/62 115/52   Pulse: 102 91   Temp: 98.7 F (37.1 C) 98.7 F (37.1 C)   TempSrc: Oral Oral   Resp: 16 20 18   Height:     Weight:  125 lb 14.4 oz (57.108 kg)   SpO2: 96% 97% 98%   Weight change: -4.8 oz (-0.136 kg)  Intake/Output Summary (Last 24 hours) at 04/12/15 5701 Last data filed at 04/12/15 0817  Gross per 24 hour  Intake 2969.83 ml  Output   1175 ml  Net 1794.83 ml    General: Alert, awake, oriented x3, in no acute distress.  HEENT: [place]/AT PEERL, EOMI, more icteric than yesterday. Neck: Trachea midline,  no masses, no thyromegal,y no JVD, no carotid bruit OROPHARYNX:  Moist, No exudate/ erythema/lesions.  Heart: Regular rate and rhythm, without murmurs, rubs, gallops, PMI non-displaced, no heaves or thrills on palpation.  Lungs: Clear to auscultation, no wheezing or rhonchi noted. No increased vocal fremitus resonant to percussion  Abdomen: Soft, nontender, nondistended, positive bowel sounds, no masses no hepatosplenomegaly noted. Neuro: No focal neurological deficits noted cranial nerves II through  XII grossly intact. Strength normal in bilateral upper and lower extremities. Musculoskeletal: No warm swelling or erythema around joints, no spinal tenderness noted. Psychiatric: Patient alert and oriented x3, good insight and cognition, good recent to remote recall.    Data Reviewed: Basic Metabolic Panel:  Recent Labs Lab 04/11/2015 1446 04/01/2015 1515 04/10/15 0805 04/11/15 0435  NA 133* 135 132* 134*  K 3.4* 3.4* 3.4* 3.4*  CL 112* 105  108 115*  CO2 14* 14* 14* 14*  GLUCOSE 95 102* 86 89  BUN CREATININE 0.76 1.73* 0.49 0.49  CALCIUM 7.7* 8.4 7.7* 7.9*   Liver Function Tests:  Recent Labs Lab 04/02/2015 1446 04/10/15 0805  AST 223* 206*  ALT 81* 71*  ALKPHOS 353* 364*  BILITOT 27.5* 28.4*  PROT 6.2* 5.9*  ALBUMIN 2.0* 1.9*   No results for input(s): LIPASE, AMYLASE in the last 168 hours. No results for input(s): AMMONIA in the last 168 hours. CBC:  Recent Labs Lab 03/27/2015 1446 04/17/2015 1515 04/10/15 0805 04/11/15 0435  WBC 13.7* 10.4 12.6* 14.9*  NEUTROABS 8.2*  --  7.8* 8.8*  HGB 5.8* 6.9* 6.8* 6.8*  HCT 16.1* 19.9* 19.9* 18.9*  MCV 101.9* 104.7* 98.0 97.4  PLT 176 212 190 176   Cardiac Enzymes: No results for input(s): CKTOTAL, CKMB, CKMBINDEX, TROPONINI in the last 168 hours. BNP (last 3 results) No results for input(s): BNP in the last 8760 hours.  ProBNP (last 3 results) No results for input(s): PROBNP in the last 8760 hours.  CBG: No results for input(s): GLUCAP in the last 168 hours.  Recent Results (from the past 240 hour(s))  Culture, blood (single)     Status: None   Collection Time: 04/03/15  3:26 PM  Result Value Ref Range Status   Organism ID, Bacteria NO GROWTH 5 DAYS  Final     Studies: Dg Chest 2 View  04/04/2015   CLINICAL DATA:  Shortness of breath.  Sickle cell anemia .  EXAM: CHEST  2 VIEW  COMPARISON:  07/23/2013.  FINDINGS: Mediastinum and hilar structures are normal. Mild interstitial prominence is stable. No focal pulmonary infiltrate noted. Mild cardiomegaly and pulmonary vascular prominence. No pleural effusion or pneumothorax. Cholecystectomy.  IMPRESSION: 1. Mild cardiomegaly and pulmonary vascular prominence. 2. Mild interstitial prominence is again noted. This may be chronic. No evidence of focal pulmonary infiltrate or overt pulmonary edema. No pleural effusion or pneumothorax.   Electronically Signed   By: Maisie Fus  Register   On: 04/04/2015 08:34      Scheduled Meds: . sodium chloride   Intravenous Once  . sodium chloride   Intravenous Once  . enoxaparin (LOVENOX) injection  40 mg Subcutaneous Q24H  . fluticasone  1 spray Each Nare Daily  . folic acid  1 mg Oral Daily  . HYDROmorphone PCA 0.3 mg/mL   Intravenous 6 times per day  . ketorolac  30 mg Intravenous 4 times per day  . mometasone-formoterol  2 puff Inhalation BID  . senna-docusate  1 tablet Oral QHS  . topiramate  50 mg Oral Q12H   Time spent 45 minutes.

## 2015-04-13 ENCOUNTER — Telehealth: Payer: Self-pay | Admitting: Neurology

## 2015-04-13 DIAGNOSIS — D589 Hereditary hemolytic anemia, unspecified: Secondary | ICD-10-CM | POA: Insufficient documentation

## 2015-04-13 DIAGNOSIS — R932 Abnormal findings on diagnostic imaging of liver and biliary tract: Secondary | ICD-10-CM

## 2015-04-13 LAB — COMPREHENSIVE METABOLIC PANEL
ALT: 91 U/L — ABNORMAL HIGH (ref 14–54)
AST: 301 U/L — ABNORMAL HIGH (ref 15–41)
Albumin: 1.7 g/dL — ABNORMAL LOW (ref 3.5–5.0)
Alkaline Phosphatase: 487 U/L — ABNORMAL HIGH (ref 38–126)
Anion gap: 8 (ref 5–15)
BUN: 13 mg/dL (ref 6–20)
CO2: 13 mmol/L — ABNORMAL LOW (ref 22–32)
Calcium: 8.2 mg/dL — ABNORMAL LOW (ref 8.9–10.3)
Chloride: 116 mmol/L — ABNORMAL HIGH (ref 101–111)
Creatinine, Ser: <0.3 mg/dL — ABNORMAL LOW (ref 0.44–1.00)
Glucose, Bld: 68 mg/dL (ref 65–99)
Potassium: 4.2 mmol/L (ref 3.5–5.1)
Sodium: 137 mmol/L (ref 135–145)
Total Bilirubin: 31.6 mg/dL (ref 0.3–1.2)
Total Protein: 5.8 g/dL — ABNORMAL LOW (ref 6.5–8.1)

## 2015-04-13 LAB — TYPE AND SCREEN
ABO/RH(D): O POS
ANTIBODY SCREEN: NEGATIVE
UNIT DIVISION: 0
Unit division: 0
Unit division: 0

## 2015-04-13 LAB — DIFFERENTIAL
Basophils Absolute: 0.7 10*3/uL — ABNORMAL HIGH (ref 0.0–0.1)
Basophils Relative: 4 % — ABNORMAL HIGH (ref 0–1)
EOS ABS: 0 10*3/uL (ref 0.0–0.7)
Eosinophils Relative: 0 % (ref 0–5)
LYMPHS ABS: 5.2 10*3/uL — AB (ref 0.7–4.0)
LYMPHS PCT: 28 % (ref 12–46)
Monocytes Absolute: 2 10*3/uL — ABNORMAL HIGH (ref 0.1–1.0)
Monocytes Relative: 11 % (ref 3–12)
NEUTROS ABS: 10.7 10*3/uL — AB (ref 1.7–7.7)
NRBC: 25 /100{WBCs} — AB
Neutrophils Relative %: 57 % (ref 43–77)

## 2015-04-13 LAB — CBC
HCT: 22.2 % — ABNORMAL LOW (ref 36.0–46.0)
HEMOGLOBIN: 7.6 g/dL — AB (ref 12.0–15.0)
MCH: 32.9 pg (ref 26.0–34.0)
MCHC: 34.2 g/dL (ref 30.0–36.0)
MCV: 96.1 fL (ref 78.0–100.0)
Platelets: 170 10*3/uL (ref 150–400)
RBC: 2.31 MIL/uL — ABNORMAL LOW (ref 3.87–5.11)
RDW: 24 % — ABNORMAL HIGH (ref 11.5–15.5)
WBC: 18.6 10*3/uL — ABNORMAL HIGH (ref 4.0–10.5)

## 2015-04-13 LAB — AFP TUMOR MARKER: AFP TUMOR MARKER: 6.3 ng/mL (ref 0.0–8.3)

## 2015-04-13 NOTE — Progress Notes (Signed)
Joan Martin   DOB:[birth year]   VQ#:[id number]   KDT#:[id number]  Subjective: Her Hb improved to 7.6 today after 1u blood transfusion yesterday. She feels slightly better.    Objective:  Filed Vitals:   04/13/15 1709  BP:   Pulse:   Temp:   Resp: 16    Body mass index is 20.51 kg/(m^2).  Intake/Output Summary (Last 24 hours) at 04/13/15 1948 Last data filed at 04/13/15 1909  Gross per 24 hour  Intake 3472.17 ml  Output   2750 ml  Net 722.17 ml     Sclerae (+) jaundice   Oropharynx clear  No peripheral adenopathy  Lungs clear -- no rales or rhonchi  Heart regular rate and rhythm  Abdomen slightly tender at RUQ  MSK no focal spinal tenderness, no peripheral edema  Neuro nonfocal   CBG (last 3)  No results for input(s): GLUCAP in the last 72 hours.   Labs:  Lab Results  Component Value Date   WBC 18.6* 04/13/2015   HGB 7.6* 04/13/2015   HCT 22.2* 04/13/2015   MCV 96.1 04/13/2015   PLT 170 04/13/2015   NEUTROABS 10.7* 04/13/2015    @LASTCHEMISTRY @  Urine Studies No results for input(s): UHGB, CRYS in the last 72 hours.  Invalid input(s): UACOL, UAPR, USPG, UPH, UTP, UGL, UKET, UBIL, UNIT, UROB, [place], UEPI, UWBC, Duwayne Heck [place], [place]  Basic Metabolic Panel:  Recent Labs Lab 04/10/15 0805 04/11/15 0435 04/12/15 0449 04/12/15 1900 04/13/15 0440  NA 132* 134* 138 137 137  K 3.4* 3.4* 4.0 4.1 4.2  CL 108 115* 116* 114* 116*  CO2 14* 14* 15* 15* 13*  GLUCOSE 86 89 78 90 68  BUN 14 13 12 13 13   CREATININE 0.49 0.49 0.40* <0.30* <0.30*  CALCIUM 7.7* 7.9* 8.0* 8.2* 8.2*   GFR CrCl cannot be calculated (Patient has no serum creatinine result on file.). Liver Function Tests:  Recent Labs Lab 03/24/2015 1446 04/10/15 0805 04/12/15 0449 04/12/15 1900 04/13/15 0440  AST 223* 206* 233* 274* 301*  ALT 81* 71* 76* 88* 91*  ALKPHOS 353* 364* 474* 498* 487*  BILITOT 27.5* 28.4* 30.4* 33.5* 31.6*  PROT 6.2* 5.9* 5.7* 6.1* 5.8*  ALBUMIN 2.0* 1.9*  1.7* 1.7* 1.7*   No results for input(s): LIPASE, AMYLASE in the last 168 hours. No results for input(s): AMMONIA in the last 168 hours. Coagulation profile  Recent Labs Lab 04/12/15 1300  INR 1.28    CBC:  Recent Labs Lab 04/08/2015 1446 03/28/2015 1515 04/10/15 0805 04/11/15 0435 04/12/15 0449 04/13/15 0440  WBC 13.7* 10.4 12.6* 14.9* 16.3* 18.6*  NEUTROABS 8.2*  --  7.8* 8.8* 9.6* 10.7*  HGB 5.8* 6.9* 6.8* 6.8* 6.4* 7.6*  HCT 16.1* 19.9* 19.9* 18.9* 18.0* 22.2*  MCV 101.9* 104.7* 98.0 97.4 98.9 96.1  PLT 176 212 190 176 172 170   Cardiac Enzymes: No results for input(s): CKTOTAL, CKMB, CKMBINDEX, TROPONINI in the last 168 hours. BNP: Invalid input(s): POCBNP CBG: No results for input(s): GLUCAP in the last 168 hours. D-Dimer No results for input(s): DDIMER in the last 72 hours. Hgb A1c No results for input(s): HGBA1C in the last 72 hours. Lipid Profile No results for input(s): CHOL, HDL, LDLCALC, TRIG, CHOLHDL, LDLDIRECT in the last 72 hours. Thyroid function studies No results for input(s): TSH, T4TOTAL, T3FREE, THYROIDAB in the last 72 hours.  Invalid input(s): FREET3 Anemia work up  Recent Labs  04/12/15 0449  RETICCTPCT 18.8*   Microbiology No results found for  this or any previous visit (from the past 240 hour(s)).    Studies:  Mr Abdomen Moise Boring Contrast  04/12/2015   CLINICAL DATA:  29 year old female with history of sickle cell disease. Liver mass noted on recent ultrasound examination.  EXAM: MRI ABDOMEN WITHOUT AND WITH CONTRAST  TECHNIQUE: Multiplanar multisequence MR imaging of the abdomen was performed both before and after the administration of intravenous contrast.  CONTRAST:  40m MULTIHANCE GADOBENATE DIMEGLUMINE 529 MG/ML IV SOLN  COMPARISON:  No priors.  FINDINGS: Lower chest:  Unremarkable.  Hepatobiliary: The liver is enlarged measuring 21.9 cm in craniocaudal span. The liver has a markedly nodular contour, compatible with underlying  cirrhosis. There is a macronodular area of the right lobe of the liver which is somewhat mass-like in appearance, however, this area is isointense to the remaining portions of the hepatic parenchyma on all pulse sequences, and enhances identically to the remaining hepatic parenchyma on post gadolinium images. Additionally, this appears to have normal vascular supply, without obvious distortion of the vessels around a mass. On arterial phase post gadolinium images, no hypervascular lesion is identified at this time. No intra or extrahepatic biliary ductal dilatation. Status post cholecystectomy.  Pancreas: No pancreatic mass. No pancreatic ductal dilatation. No pancreatic or peripancreatic fluid or inflammatory changes.  Spleen: The spleen is not visualized. 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  Adrenals/Urinary Tract: Bilateral adrenal glands are normal in appearance. Sub cm lesions in the kidneys bilaterally are low T1 signal intensity, high T2 signal intensity, and do not enhance, compatible with tiny cysts. No hydroureteronephrosis in the visualized abdomen.  Stomach/Bowel: Visualized portions are unremarkable.  Vascular/Lymphatic: Mild dilatation of the portal vein which measures up to 15 mm in diameter proximally. No aneurysm identified in the visualized abdominal vasculature. No lymphadenopathy noted in the abdomen.  Other: Small volume of ascites.  Musculoskeletal: No aggressive osseous lesions noted in the visualized portions of the skeleton.  IMPRESSION: 1. The liver is enlarged, and there are morphologic changes compatible with underlying cirrhosis. The perceived mass in the right lobe of the liver corresponds to an usually macrolobulated portion of the hepatic parenchyma. This is an unusual but benign finding. No hypervascular lesion to suggest  hepatocellular carcinoma at this time. 2. 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 3. 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   On: 04/12/2015 16:23    ASSESSMENT & PLAN:  29year old African-American female, who is known history of sickle cell SS disease, presented with worsening anemia and pain crisis.   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 Coombs test was negative, no evidence of auto immune hemolysis.  -She responded well to the second unit blood transfusion yesterday -I suspect her significantly elevated bilirubin is partially rich in her liver disease. Consider check direct bilirubin  -if her hemolysis lab (LDH, indirectal bili) stable or improve, no acute need for exchange blood transfusion. However, given her history of stroke, she would benefit from prophylactic exchange blood transfusion when her anemia improves, which will be managed by her PCP Dr. MIvan Croft she agrees with it.  -Please check BY18level, and folic acid level, to ruled out effusion anemia. She is taking folic acid, but not  on B12 supplement.   2. Liver cirrhosis  -Her liver MRI reviewed evidence of liver cirrhosis, the liver lesion appears to be benign on the MRI, no imaging evidence of HCC. -The underlying etiology of her liver cirrhosis is currently being evaluated by gastroenterologist Dr. Carlean Purl -Repeat iron level and a ferritin, she may have iron overload from her previous multiple blood transfusion. Iron chelating therapy may be needed if the other workup for liver cirrhosis is negative.  Recommendations: -hold on exchange blood transfusion for now  -follow up CBC, LDH, ret, and total/direct bili daily  -please check L27, folic acid, iron and TIBC  I'll follow up as needed.   Truitt Merle, MD 04/13/2015  7:48  PM

## 2015-04-13 NOTE — Progress Notes (Signed)
SICKLE CELL SERVICE PROGRESS NOTE  Joan Martin WUJ:[id number] DOB: 07/12/1986 DOA: 04/11/2015 PCP: MATTHEWS,MICHELLE A., MD  Assessment/Plan: Active Problems:   Dehydration   Jaundice, hemolytic   Frequent headaches   Hb-SS disease with crisis   Asthma, chronic   1. Elevated Alkaline Phosphatase and AST: Patient appears to have liver cirrhosis probably related to her chronic sickle cell crisis was infarctions although Iron overload may be possible. Pt still without abdominal symptoms. We'll continue per GI recommendations mainly supportive care 2. Hemolysis: Pt has received PRBC transfusion. She is deemed not a candidate for red cell apheresis. We will monitor her hemoglobin on telemetry stable. 3. Hb SS with crisis: Pt still has no significant pain. We will transition her to oral medications and leave the PCA for now as needed. Continue Toradol and IVF.  4. Leukocytosis: Pt has a mild leukocytosis without evidence of infection. However she has a fever last week which may have reflected a viral syndrome. Her CXR, U/A and blood cultures from last week were all negative. 5. Chronic pain: Pt uses very little analgesic medication.  6. Sickle Cell Disease: Pt patient has been off hydroxyurea secondary to low hemoglobin. We will resume that as soon as possible. 7. 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 She is followed by Dr. Elmer Picker [facility] and her visits are now spaced out to yearly surveillance instead of every 6 months. She has headaches today which may not be related to her history of CVA could be migraine headaches but we will watch her closely.  Code Status: Full Code Family Communication: Had in-depth discussion with patient and father Disposition Plan: Not yet ready for discharge  [facility]  Pager [phone]. If 7PM-7AM, please contact night-coverage.  04/13/2015, 6:15 PM  LOS: 4 days     Consultants:  Gastroenterology  Hematology  Procedures:  None  Antibiotics:  None  HPI/Subjective: Patient has no significant complaint except for headaches. She is severely jaundiced but no pain. She is stable at this point.  Objective: Filed Vitals:   04/11/15 0201 04/11/15 0501 04/11/15 0801  BP: 120/62 115/52   Pulse: 102 91   Temp: 98.7 F (37.1 C) 98.7 F (37.1 C)   TempSrc: Oral Oral   Resp: Height:     Weight:  125 lb 14.4 oz (57.108 kg)   SpO2: 96% 97% 98%   Weight change: 2.313 kg (5 lb 1.6 oz)  Intake/Output Summary (Last 24 hours) at 04/13/15 1815 Last data filed at 04/13/15 1520  Gross per 24 hour  Intake 3472.17 ml  Output   2250 ml  Net 1222.17 ml    General: Alert, awake, oriented x3, in no acute distress.  HEENT: /AT PEERL, EOMI, with increased hemolysis.. Neck: Trachea midline,  no masses, no thyromegal,y no JVD, no carotid bruit OROPHARYNX:  Moist, No exudate/ erythema/lesions.  Heart: Regular rate and rhythm, without murmurs, rubs, gallops, PMI non-displaced, no heaves or thrills on palpation.  Lungs: Clear to auscultation, no wheezing or rhonchi noted. No increased vocal fremitus resonant to percussion  Abdomen: Soft, nontender, nondistended, positive bowel sounds, no masses mild hepatomegaly noted. Neuro: No focal neurological deficits noted cranial nerves II through XII grossly intact. Strength normal in bilateral upper and lower extremities. Musculoskeletal: No warm swelling or erythema around joints, no spinal tenderness noted. Psychiatric: Patient alert and oriented x3, good insight and cognition, good recent to remote recall.    Data Reviewed: Basic Metabolic Panel:  Recent Labs Lab Apr 18, 2015 1446 Apr 18, 2015 1515 04/10/15 0805 04/11/15 0435  NA 133* 135 132* 134*  K 3.4* 3.4* 3.4* 3.4*  CL 112* 105 108 115*  CO2 14* 14* 14* 14*  GLUCOSE 95 102* 86 89  BUN 20 18 14 13   CREATININE 0.76 1.73* 0.49 0.49   CALCIUM 7.7* 8.4 7.7* 7.9*   Liver Function Tests:  Recent Labs Lab 2015/04/18 1446 04/10/15 0805  AST 223* 206*  ALT 81* 71*  ALKPHOS 353* 364*  BILITOT 27.5* 28.4*  PROT 6.2* 5.9*  ALBUMIN 2.0* 1.9*   No results for input(s): LIPASE, AMYLASE in the last 168 hours. No results for input(s): AMMONIA in the last 168 hours. CBC:  Recent Labs Lab 04-18-15 1446 Apr 18, 2015 1515 04/10/15 0805 04/11/15 0435  WBC 13.7* 10.4 12.6* 14.9*  NEUTROABS 8.2*  --  7.8* 8.8*  HGB 5.8* 6.9* 6.8* 6.8*  HCT 16.1* 19.9* 19.9* 18.9*  MCV 101.9* 104.7* 98.0 97.4  PLT 176 212 190 176   Cardiac Enzymes: No results for input(s): CKTOTAL, CKMB, CKMBINDEX, TROPONINI in the last 168 hours. BNP (last 3 results) No results for input(s): BNP in the last 8760 hours.  ProBNP (last 3 results) No results for input(s): PROBNP in the last 8760 hours.  CBG: No results for input(s): GLUCAP in the last 168 hours.  No results found for this or any previous visit (from the past 240 hour(s)).   Studies: Dg Chest 2 View  04/04/2015   CLINICAL DATA:  Shortness of breath.  Sickle cell anemia .  EXAM: CHEST  2 VIEW  COMPARISON:  07/23/2013.  FINDINGS: Mediastinum and hilar structures are normal. Mild interstitial prominence is stable. No focal pulmonary infiltrate noted. Mild cardiomegaly and pulmonary vascular prominence. No pleural effusion or pneumothorax. Cholecystectomy.  IMPRESSION: 1. Mild cardiomegaly and pulmonary vascular prominence. 2. Mild interstitial prominence is again noted. This may be chronic. No evidence of focal pulmonary infiltrate or overt pulmonary edema. No pleural effusion or pneumothorax.   Electronically Signed   By: Maisie Fus  Register   On: 04/04/2015 08:34     Scheduled Meds: . sodium chloride   Intravenous Once  . sodium chloride   Intravenous Once  . enoxaparin (LOVENOX) injection  40 mg Subcutaneous Q24H  . fluticasone  1 spray Each Nare Daily  . folic acid  1 mg Oral Daily  .  HYDROmorphone PCA 0.3 mg/mL   Intravenous 6 times per day  . ketorolac  30 mg Intravenous 4 times per day  . mometasone-formoterol  2 puff Inhalation BID  . senna-docusate  1 tablet Oral QHS  . topiramate  50 mg Oral Q12H   Time spent 50 minutes.

## 2015-04-13 NOTE — Telephone Encounter (Signed)
Please advise patient also states that she is currently in the hospital

## 2015-04-13 NOTE — Telephone Encounter (Signed)
Please inform patient she should speak to the attending physician for in-hospital medications for headaches.  Once she is discharged, Dr. Everlena Cooper can decide what options are available for her.

## 2015-04-13 NOTE — Telephone Encounter (Signed)
LMOM unable to reach patient

## 2015-04-13 NOTE — Progress Notes (Signed)
CRITICAL VALUE ALERT  Critical value received:  Total Bilirubin 33.5  Date of notification:  04/12/15  Time of notification: 2015  Critical value read back:Yes  Nurse who received alert: Kenton Kingfisher, RN; [phone]  MD notified (1st page):  Craige Cotta  Time of first page: 2024  MD notified (2nd page): n/a  Time of second page: n/a  Responding MD:  Craige Cotta was sent detailed message w/ critical lab value (MD's already aware of high total bili levels)  Time MD responded:  N/A

## 2015-04-13 NOTE — Progress Notes (Signed)
Progress Note   Subjective  no complaints today   Objective   Vital signs in last 24 hours: Temp:  [97.4 F (36.3 C)-98.6 F (37 C)] 98.4 F (36.9 C) (06/24 0926) Pulse Rate:  [86-92] 86 (06/24 0926) Resp:  [16-25] 18 (06/24 0926) BP: (113-124)/(64-80) 113/75 mmHg (06/24 0926) SpO2:  [96 %-100 %] 100 % (06/24 0926) Weight:  [131 lb (59.421 kg)] 131 lb (59.421 kg) (06/24 0605) Last BM Date: 04/12/15 General:    Pleasant black female in NAD Heart:  Regular rate and rhythm Abdomen:  Soft, nontender and nondistended. Normal bowel sounds. Extremities:  Without edema. Neurologic:  Alert and oriented,  grossly normal neurologically. Psych:  Cooperative. Normal mood and affect.  Lab Results:  Recent Labs  04/11/15 0435 04/12/15 0449 04/13/15 0440  WBC 14.9* 16.3* PENDING  HGB 6.8* 6.4* 7.6*  HCT 18.9* 18.0* 22.2*  PLT 176 172 PENDING   BMET  Recent Labs  04/12/15 0449 04/12/15 1900 04/13/15 0440  NA 138 137 137  K 4.0 4.1 4.2  CL 116* 114* 116*  CO2 15* 15* 13*  GLUCOSE 78 90 68  BUN 12 13 13   CREATININE 0.40* <0.30* <0.30*  CALCIUM 8.0* 8.2* 8.2*   LFT  Recent Labs  04/13/15 0440  PROT 5.8*  ALBUMIN 1.7*  AST 301*  ALT 91*  ALKPHOS 487*  BILITOT PENDING   PT/INR  Recent Labs  04/12/15 1300  LABPROT 16.2*  INR 1.28    Studies/Results: Mr Abdomen W Wo Contrast  04/12/2015   CLINICAL DATA:  29 year old female with history of sickle cell disease. Liver mass noted on recent ultrasound examination.  EXAM: MRI ABDOMEN WITHOUT AND WITH CONTRAST  TECHNIQUE: Multiplanar multisequence MR imaging of the abdomen was performed both before and after the administration of intravenous contrast.  CONTRAST:  73m MULTIHANCE GADOBENATE DIMEGLUMINE 529 MG/ML IV SOLN  COMPARISON:  No priors.  FINDINGS: Lower chest:  Unremarkable.  Hepatobiliary: The liver is enlarged measuring 21.9 cm in craniocaudal span. The liver has a markedly nodular contour, compatible with  underlying cirrhosis. There is a macronodular area of the right lobe of the liver which is somewhat mass-like in appearance, however, this area is isointense to the remaining portions of the hepatic parenchyma on all pulse sequences, and enhances identically to the remaining hepatic parenchyma on post gadolinium images. Additionally, this appears to have normal vascular supply, without obvious distortion of the vessels around a mass. On arterial phase post gadolinium images, no hypervascular lesion is identified at this time. No intra or extrahepatic biliary ductal dilatation. Status post cholecystectomy.  Pancreas: No pancreatic mass. No pancreatic ductal dilatation. No pancreatic or peripancreatic fluid or inflammatory changes.  Spleen: The spleen is not visualized. 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  Adrenals/Urinary Tract: Bilateral adrenal glands are normal in appearance. Sub cm lesions in the kidneys bilaterally are low T1 signal intensity, high T2 signal intensity, and do not enhance, compatible with tiny cysts. No hydroureteronephrosis in the visualized abdomen.  Stomach/Bowel: Visualized portions are unremarkable.  Vascular/Lymphatic: Mild dilatation of the portal vein which measures up to 15 mm in diameter proximally. No aneurysm identified in the visualized abdominal vasculature. No lymphadenopathy noted in the abdomen.  Other: Small volume of ascites.  Musculoskeletal: No aggressive osseous lesions noted in the visualized portions of the skeleton.  IMPRESSION: 1. The liver is  enlarged, and there are morphologic changes compatible with underlying cirrhosis. The perceived mass in the right lobe of the liver corresponds to an usually macrolobulated portion of the hepatic parenchyma. This is an unusual but benign finding. No hypervascular lesion to  suggest hepatocellular carcinoma at this time. 2. 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 3. 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   On: 04/12/2015 16:23   US Abdomen Limited  04/11/2015   CLINICAL DATA:  Elevated liver function tests. Sickle cell disease. Prior cholecystectomy.  EXAM: US ABDOMEN LIMITED - RIGHT UPPER QUADRANT  COMPARISON:  None.  FINDINGS: Gallbladder:  Removed.  Common bile duct:  Diameter: 3.4 mm, normal.  Liver:  There is a masslike lesion measuring 9.6 x 9.0 x 6.4 cm in the right lobe. The echogenicity is similar to liver but the mass appears to have a relatively definable margin. There is slight nodularity of the surface of the liver.  There is small amount of ascites in the abdomen.  IMPRESSION: Findings are suggestive of cirrhosis. Possible 9.6 cm mass in the right lobe of the liver. Ascites.   Electronically Signed   By: Lorriane Shire M.D.   On: 04/11/2015 19:00     Assessment / Plan:    22. 29 year old female with sickle cell admitted with SS crisis. Transfused two units of blood this admission for hemolytic anemia.   2. Progressive elevation of alk phos and AST with findings of probable cirrhosis and a large liver lesion on CTscan.  Lesion doesn't appear to be malignant based on MRI and AFP is normal. Cirrhosis workup in progress, autoimmune / genetic studies pending.    LOS: 4 days   Tye Savoy  04/13/2015, 9:51 AM   Agree with Ms. Vanita Ingles assessment and plan. Gatha Mayer, MD, [facility]  Will f/u Monday or as outpatient re: cirrhosis serologies.

## 2015-04-13 NOTE — Telephone Encounter (Signed)
Pt called and wanted Dr Everlena Cooper to prescribe her something for her headaches because she is not sleeping through the night and the headaches are lasting 6-8 hours, would like a call back at [phone]/Dawn

## 2015-04-14 LAB — COMPREHENSIVE METABOLIC PANEL
ALT: 123 U/L — ABNORMAL HIGH (ref 14–54)
AST: 427 U/L — ABNORMAL HIGH (ref 15–41)
Albumin: 1.7 g/dL — ABNORMAL LOW (ref 3.5–5.0)
Alkaline Phosphatase: 540 U/L — ABNORMAL HIGH (ref 38–126)
Anion gap: 8 (ref 5–15)
BILIRUBIN TOTAL: 40.6 mg/dL — AB (ref 0.3–1.2)
BUN: 14 mg/dL (ref 6–20)
CO2: 13 mmol/L — AB (ref 22–32)
Calcium: 8.8 mg/dL — ABNORMAL LOW (ref 8.9–10.3)
Chloride: 116 mmol/L — ABNORMAL HIGH (ref 101–111)
GLUCOSE: 81 mg/dL (ref 65–99)
Potassium: 4.2 mmol/L (ref 3.5–5.1)
Sodium: 137 mmol/L (ref 135–145)
Total Protein: 6 g/dL — ABNORMAL LOW (ref 6.5–8.1)

## 2015-04-14 LAB — BILIRUBIN, FRACTIONATED(TOT/DIR/INDIR): Total Bilirubin: 40.6 mg/dL (ref 0.3–1.2)

## 2015-04-14 LAB — CERULOPLASMIN: Ceruloplasmin: 37.2 mg/dL (ref 19.0–39.0)

## 2015-04-14 LAB — HEPATITIS B CORE ANTIBODY, TOTAL: Hep B Core Total Ab: NEGATIVE

## 2015-04-14 MED ORDER — IBUPROFEN 800 MG PO TABS
800.0000 mg | ORAL_TABLET | Freq: Three times a day (TID) | ORAL | Status: DC
  Administered 2015-04-14 – 2015-04-17 (×11): 800 mg via ORAL
  Filled 2015-04-14 (×12): qty 1

## 2015-04-14 MED ORDER — OXYCODONE HCL 5 MG PO TABS
10.0000 mg | ORAL_TABLET | ORAL | Status: DC | PRN
Start: 2015-04-14 — End: 2015-04-18
  Administered 2015-04-14 – 2015-04-17 (×10): 10 mg via ORAL
  Filled 2015-04-14 (×11): qty 2

## 2015-04-14 MED ORDER — POTASSIUM CHLORIDE IN NACL 20-0.9 MEQ/L-% IV SOLN
INTRAVENOUS | Status: DC
  Administered 2015-04-14 – 2015-04-17 (×9): via INTRAVENOUS
  Filled 2015-04-14 (×10): qty 1000

## 2015-04-14 MED ORDER — ENSURE ENLIVE PO LIQD
237.0000 mL | ORAL | Status: DC | PRN
  Administered 2015-04-16: 237 mL via ORAL
  Filled 2015-04-14: qty 237

## 2015-04-14 NOTE — Progress Notes (Signed)
CRITICAL VALUE ALERT  Critical value received:  Total bili 40.6  Date of notification:  04/14/2015   Time of notification:  2:07 PM   Critical value read back:Yes.    Nurse who received alert:  Armanda Heritage   MD notified (1st page):  Dr Mikeal Hawthorne  Time of first page:  2:07 PM   MD notified (2nd page):  Time of second page:  Responding MD:  Dr Mikeal Hawthorne  Time MD responded:  2:09 PM

## 2015-04-14 NOTE — Progress Notes (Signed)
SICKLE CELL SERVICE PROGRESS NOTE  Joan Martin TDV:[id number] DOB: [birth year] DOA: 03/27/2015 PCP: MATTHEWS,MICHELLE A., MD  Assessment/Plan: Active Problems:   Dehydration   Jaundice, hemolytic   Frequent headaches   Hb-SS disease with crisis   Asthma, chronic   Hemolytic anemia   1. Elevated Alkaline Phosphatase and AST: Liver enzymes continue to increase. Her total bilirubin is now 40.6 most of which was direct. She is clinically doing better except for the jaundice. Most likely not due to hemolysis. We will defer to GI for further advice. 2. Hemolysis: Patient received 1 unit PRBC and hemoglobin has risen yesterday. Transfused 1 unit RBC' s. Hematology has been consulted also. Continue to monitor H&H 3. Hb SS with crisis: I will transition to oral medications with when necessary IV Dilaudid. Switch over from Toradol to ibuprofen today.  4. Leukocytosis: Probably due to sickle cell crisis. We'll keep monitoring. 5. Chronic pain: Pt uses very little analgesic medication.  6. Sickle Cell Disease: We will resume hydroxyurea once hemoglobin remains stable above 7 g 7. H/O CVA: Continue close monitoring.  Code Status: Full Code Family Communication: Had in-depth discussion with patient and father Disposition Plan: Not yet ready for discharge  [facility]  Pager [phone]. If 7PM-7AM, please contact night-coverage.  04/14/2015, 2:04 PM  LOS: 5 days    Consultants:  Gastroenterology  Hematology  Procedures:  None  Antibiotics:  None  HPI/Subjective: Patient is feeling much better today even though her liver function has getting worse. Her headaches are persistent but much better also. No cough no fever no shortness of breath.  Objective: Filed Vitals:   04/11/15 0201 04/11/15 0501 04/11/15 0801  BP: 120/62 115/52   Pulse: 102 91   Temp: 98.7 F (37.1 C) 98.7 F (37.1 C)   TempSrc: Oral Oral   Resp: 16 20 18   Height:     Weight:  125 lb 14.4 oz (57.108 kg)    SpO2: 96% 97% 98%   Weight change: 0.68 kg (1 lb 8 oz)  Intake/Output Summary (Last 24 hours) at 04/14/15 1404 Last data filed at 04/14/15 1350  Gross per 24 hour  Intake 3806.33 ml  Output   1900 ml  Net 1906.33 ml    General: Alert, awake, oriented x3, in no acute distress.  HEENT: [place]/AT PEERL, EOMI, with increased hemolysis.. Neck: Trachea midline,  no masses, no thyromegal,y no JVD, no carotid bruit OROPHARYNX:  Moist, No exudate/ erythema/lesions.  Heart: Regular rate and rhythm, without murmurs, rubs, gallops, PMI non-displaced, no heaves or thrills on palpation.  Lungs: Clear to auscultation, no wheezing or rhonchi noted. No increased vocal fremitus resonant to percussion  Abdomen: Soft, nontender, nondistended, positive bowel sounds, no masses mild hepatomegaly noted. Neuro: No focal neurological deficits noted cranial nerves II through XII grossly intact. Strength normal in bilateral upper and lower extremities. Musculoskeletal: No warm swelling or erythema around joints, no spinal tenderness noted. Psychiatric: Patient alert and oriented x3, good insight and cognition, good recent to remote recall.    Data Reviewed: Basic Metabolic Panel:  Recent Labs Lab 04/02/2015 1446 04/13/2015 1515 04/10/15 0805 04/11/15 0435  NA 133* 135 132* 134*  K 3.4* 3.4* 3.4* 3.4*  CL 112* 105 108 115*  CO2 14* 14* 14* 14*  GLUCOSE 95 102* 86 89  BUN 20 18 14 13   CREATININE 0.76 1.73* 0.49 0.49  CALCIUM 7.7* 8.4 7.7* 7.9*   Liver Function Tests:  Recent Labs Lab 04/13/2015 1446 04/10/15 0805  AST 223* 206*  ALT 81* 71*  ALKPHOS 353* 364*  BILITOT 27.5* 28.4*  PROT 6.2* 5.9*  ALBUMIN 2.0* 1.9*   No results for input(s): LIPASE, AMYLASE in the last 168 hours. No results for input(s): AMMONIA in the last 168 hours. CBC:  Recent Labs Lab 03/30/2015 1446 03/31/2015 1515 04/10/15 0805 04/11/15 0435  WBC 13.7* 10.4 12.6* 14.9*  NEUTROABS 8.2*  --  7.8* 8.8*  HGB 5.8* 6.9*  6.8* 6.8*  HCT 16.1* 19.9* 19.9* 18.9*  MCV 101.9* 104.7* 98.0 97.4  PLT 176 212 190 176   Cardiac Enzymes: No results for input(s): CKTOTAL, CKMB, CKMBINDEX, TROPONINI in the last 168 hours. BNP (last 3 results) No results for input(s): BNP in the last 8760 hours.  ProBNP (last 3 results) No results for input(s): PROBNP in the last 8760 hours.  CBG: No results for input(s): GLUCAP in the last 168 hours.  No results found for this or any previous visit (from the past 240 hour(s)).   Studies: Dg Chest 2 View  04/04/2015   CLINICAL DATA:  Shortness of breath.  Sickle cell anemia .  EXAM: CHEST  2 VIEW  COMPARISON:  07/23/2013.  FINDINGS: Mediastinum and hilar structures are normal. Mild interstitial prominence is stable. No focal pulmonary infiltrate noted. Mild cardiomegaly and pulmonary vascular prominence. No pleural effusion or pneumothorax. Cholecystectomy.  IMPRESSION: 1. Mild cardiomegaly and pulmonary vascular prominence. 2. Mild interstitial prominence is again noted. This may be chronic. No evidence of focal pulmonary infiltrate or overt pulmonary edema. No pleural effusion or pneumothorax.   Electronically Signed   By: Maisie Fus  Register   On: 04/04/2015 08:34     Scheduled Meds: . sodium chloride   Intravenous Once  . sodium chloride   Intravenous Once  . enoxaparin (LOVENOX) injection  40 mg Subcutaneous Q24H  . fluticasone  1 spray Each Nare Daily  . folic acid  1 mg Oral Daily  . ibuprofen  800 mg Oral TID  . mometasone-formoterol  2 puff Inhalation BID  . senna-docusate  1 tablet Oral QHS  . topiramate  50 mg Oral Q12H   Time spent 50 minutes.

## 2015-04-15 LAB — MITOCHONDRIAL ANTIBODIES: MITOCHONDRIAL M2 AB, IGG: 12.2 U (ref 0.0–20.0)

## 2015-04-15 LAB — COMPREHENSIVE METABOLIC PANEL
ALK PHOS: 520 U/L — AB (ref 38–126)
ALT: 112 U/L — ABNORMAL HIGH (ref 14–54)
AST: 406 U/L — ABNORMAL HIGH (ref 15–41)
Albumin: 1.6 g/dL — ABNORMAL LOW (ref 3.5–5.0)
Anion gap: 5 (ref 5–15)
BILIRUBIN TOTAL: 33 mg/dL — AB (ref 0.3–1.2)
BUN: 15 mg/dL (ref 6–20)
CO2: 14 mmol/L — AB (ref 22–32)
Calcium: 8.6 mg/dL — ABNORMAL LOW (ref 8.9–10.3)
Chloride: 116 mmol/L — ABNORMAL HIGH (ref 101–111)
Creatinine, Ser: <0.3 mg/dL — ABNORMAL LOW (ref 0.44–1.00)
Glucose, Bld: 67 mg/dL (ref 65–99)
Potassium: 4.1 mmol/L (ref 3.5–5.1)
SODIUM: 135 mmol/L (ref 135–145)
TOTAL PROTEIN: 5.7 g/dL — AB (ref 6.5–8.1)

## 2015-04-15 LAB — CBC WITH DIFFERENTIAL/PLATELET
BASOS ABS: 0 10*3/uL (ref 0.0–0.1)
BASOS PCT: 0 % (ref 0–1)
EOS ABS: 0 10*3/uL (ref 0.0–0.7)
Eosinophils Relative: 0 % (ref 0–5)
HCT: 20.6 % — ABNORMAL LOW (ref 36.0–46.0)
HEMOGLOBIN: 7.1 g/dL — AB (ref 12.0–15.0)
LYMPHS ABS: 11.4 10*3/uL — AB (ref 0.7–4.0)
Lymphocytes Relative: 48 % — ABNORMAL HIGH (ref 12–46)
MCH: 33.2 pg (ref 26.0–34.0)
MCHC: 34.5 g/dL (ref 30.0–36.0)
MCV: 96.3 fL (ref 78.0–100.0)
MONOS PCT: 15 % — AB (ref 3–12)
Monocytes Absolute: 3.6 10*3/uL — ABNORMAL HIGH (ref 0.1–1.0)
Neutro Abs: 8.8 10*3/uL — ABNORMAL HIGH (ref 1.7–7.7)
Neutrophils Relative %: 37 % — ABNORMAL LOW (ref 43–77)
Platelets: 165 10*3/uL (ref 150–400)
RBC: 2.14 MIL/uL — AB (ref 3.87–5.11)
RDW: 24.6 % — AB (ref 11.5–15.5)
WBC: 23.8 10*3/uL — AB (ref 4.0–10.5)

## 2015-04-15 LAB — ANTI-SMOOTH MUSCLE ANTIBODY, IGG: F-Actin IgG: 20 Units — ABNORMAL HIGH (ref 0–19)

## 2015-04-15 MED ORDER — VITAMIN D (ERGOCALCIFEROL) 1.25 MG (50000 UNIT) PO CAPS
50000.0000 [IU] | ORAL_CAPSULE | ORAL | Status: DC
  Administered 2015-04-15: 50000 [IU] via ORAL
  Filled 2015-04-15: qty 1

## 2015-04-16 ENCOUNTER — Encounter (HOSPITAL_COMMUNITY): Payer: Self-pay | Admitting: Physician Assistant

## 2015-04-16 DIAGNOSIS — K7460 Unspecified cirrhosis of liver: Secondary | ICD-10-CM

## 2015-04-16 DIAGNOSIS — R748 Abnormal levels of other serum enzymes: Secondary | ICD-10-CM | POA: Insufficient documentation

## 2015-04-16 LAB — FANA STAINING PATTERNS: Speckled Pattern: 1:80 {titer}

## 2015-04-16 LAB — ALPHA-1 ANTITRYPSIN PHENOTYPE: A-1 Antitrypsin, Ser: 163 mg/dL (ref 90–200)

## 2015-04-16 LAB — ANTINUCLEAR ANTIBODIES, IFA

## 2015-04-16 NOTE — Progress Notes (Signed)
SICKLE CELL SERVICE PROGRESS NOTE  Joan Martin TTS:[id number] DOB: [birth year] DOA: 04/02/2015 PCP: MATTHEWS,MICHELLE A., MD  Assessment/Plan: Active Problems:   Dehydration   Jaundice, hemolytic   Frequent headaches   Hb-SS disease with crisis   Asthma, chronic   Hemolytic anemia   Hepatic cirrhosis   Elevated liver enzymes   1. Elevated Alkaline Phosphatase and AST: Total bilirubin is getting better but the rest of the LFTs are still high. Patient scheduled for liver biopsy on Tuesday 2. Hemolysis: Patient received 1 unit PRBC and hemoglobin has been stable. Continue to monitor H&H 3. Hb SS with crisis: I will transition to oral medications. Switched over from Toradol to ibuprofen.  4. Leukocytosis: Probably due to sickle cell crisis. We'll keep monitoring. 5. Chronic pain: Pt uses very little analgesic medication.  6. Sickle Cell Disease: We will resume hydroxyurea once hemoglobin remains stable above 7 g 7. H/O CVA: Continue close monitoring.  Code Status: Full Code Family Communication: Had in-depth discussion with patient and father Disposition Plan: Not yet ready for discharge  [facility]  Pager [phone]. If 7PM-7AM, please contact night-coverage.  04/15/2015, 2:55 PM  LOS: 6 days    Consultants:  Gastroenterology  Hematology  Procedures:  None  Antibiotics:  None  HPI/Subjective: Patient is doing better today and once to be discharged home. She is awaiting liver biopsy. Biopsy scheduled for tomorrow. Bilirubin has gotten better.  Objective: Filed Vitals:   04/11/15 0201 04/11/15 0501 04/11/15 0801  BP: 120/62 115/52   Pulse: 102 91   Temp: 98.7 F (37.1 C) 98.7 F (37.1 C)   TempSrc: Oral Oral   Resp: 16 20 18   Height:     Weight:  125 lb 14.4 oz (57.108 kg)   SpO2: 96% 97% 98%   Weight change: -0.181 kg (-6.4 oz)  Intake/Output Summary (Last 24 hours) at 04/15/15 0655 Last data filed at 04/15/15 0536  Gross per 24 hour  Intake 1306.67 ml   Output    400 ml  Net 906.67 ml    General: Alert, awake, oriented x3, in no acute distress.  HEENT: /AT PEERL, EOMI, with increased hemolysis.. Neck: Trachea midline,  no masses, no thyromegal,y no JVD, no carotid bruit OROPHARYNX:  Moist, No exudate/ erythema/lesions.  Heart: Regular rate and rhythm, without murmurs, rubs, gallops, PMI non-displaced, no heaves or thrills on palpation.  Lungs: Clear to auscultation, no wheezing or rhonchi noted. No increased vocal fremitus resonant to percussion  Abdomen: Soft, nontender, nondistended, positive bowel sounds, no masses mild hepatomegaly noted. Neuro: No focal neurological deficits noted cranial nerves II through XII grossly intact. Strength normal in bilateral upper and lower extremities. Musculoskeletal: No warm swelling or erythema around joints, no spinal tenderness noted. Psychiatric: Patient alert and oriented x3, good insight and cognition, good recent to remote recall.      Data Reviewed: Results for LOISTINE, EBERLIN (MRN [id number]) as of 04/16/2015 20:01  Ref. Range 04/14/2015 12:47 04/15/2015 06:11  Sodium Latest Ref Range: 135-145 mmol/L 137 135  Potassium Latest Ref Range: 3.5-5.1 mmol/L 4.2 4.1  Chloride Latest Ref Range: 101-111 mmol/L 116 (H) 116 (H)  CO2 Latest Ref Range: 22-32 mmol/L 13 (L) 14 (L)  BUN Latest Ref Range: 6-20 mg/dL 14 15  Creatinine Latest Ref Range: 0.44-1.00 mg/dL <0.30 (L) <0.30 (L)  Calcium Latest Ref Range: 8.9-10.3 mg/dL 8.8 (L) 8.6 (L)  EGFR (Non-African Amer.) Latest Ref Range: >60 mL/min NOT CALCULATED NOT CALCULATED  EGFR (African American) Latest Ref Range: >60 mL/min  NOT CALCULATED NOT CALCULATED  Glucose Latest Ref Range: 65-99 mg/dL 81 67  Anion gap Latest Ref Range: 5-15  8 5   Alkaline Phosphatase Latest Ref Range: 38-126 U/L 540 (H) 520 (H)  Albumin Latest Ref Range: 3.5-5.0 g/dL 1.7 (L) 1.6 (L)  AST Latest Ref Range: 15-41 U/L 427 (H) 406 (H)  ALT Latest Ref Range: 14-54 U/L 123 (H)  112 (H)  Total Protein Latest Ref Range: 6.5-8.1 g/dL 6.0 (L) 5.7 (L)  Total Bilirubin Latest Ref Range: 0.3-1.2 mg/dL 40.6 (HH) 33.0 (HH)  WBC Latest Ref Range: 4.0-10.5 K/uL  23.8 (H)  RBC Latest Ref Range: 3.87-5.11 MIL/uL  2.14 (L)  Hemoglobin Latest Ref Range: 12.0-15.0 g/dL  7.1 (L)  HCT Latest Ref Range: 36.0-46.0 %  20.6 (L)  MCV Latest Ref Range: 78.0-100.0 fL  96.3  MCH Latest Ref Range: 26.0-34.0 pg  33.2  MCHC Latest Ref Range: 30.0-36.0 g/dL  34.5  RDW Latest Ref Range: 11.5-15.5 %  24.6 (H)  Platelets Latest Ref Range: 150-400 K/uL  165  Neutrophils Latest Ref Range: 43-77 %  37 (L)  Lymphocytes Latest Ref Range: 12-46 %  48 (H)  Monocytes Relative Latest Ref Range: 3-12 %  15 (H)  Eosinophil Latest Ref Range: 0-5 %  0  Basophil Latest Ref Range: 0-1 %  0  NEUT# Latest Ref Range: 1.7-7.7 K/uL  8.8 (H)  Lymphocyte # Latest Ref Range: 0.7-4.0 K/uL  11.4 (H)  Monocyte # Latest Ref Range: 0.1-1.0 K/uL  3.6 (H)  Eosinophils Absolute Latest Ref Range: 0.0-0.7 K/uL  0.0  Basophils Absolute Latest Ref Range: 0.0-0.1 K/uL  0.0  RBC Morphology Unknown  MARKED POLYCHROMASIA  WBC Morphology Unknown  SMUDGE CELLS   Basic Metabolic Panel:  Cardiac Enzymes:  BNP (last 3 results) No results for input(s): BNP in the last 8760 hours.  ProBNP (last 3 results) No results for input(s): PROBNP in the last 8760 hours.  CBG: No results for input(s): GLUCAP in the last 168 hours.  No results found for this or any previous visit (from the past 240 hour(s)).   Studies: Dg Chest 2 View  04/04/2015   CLINICAL DATA:  Shortness of breath.  Sickle cell anemia .  EXAM: CHEST  2 VIEW  COMPARISON:  07/23/2013.  FINDINGS: Mediastinum and hilar structures are normal. Mild interstitial prominence is stable. No focal pulmonary infiltrate noted. Mild cardiomegaly and pulmonary vascular prominence. No pleural effusion or pneumothorax. Cholecystectomy.  IMPRESSION: 1. Mild cardiomegaly and pulmonary  vascular prominence. 2. Mild interstitial prominence is again noted. This may be chronic. No evidence of focal pulmonary infiltrate or overt pulmonary edema. No pleural effusion or pneumothorax.   Electronically Signed   By: Marcello Moores  Register   On: 04/04/2015 08:34     Scheduled Meds: . sodium chloride   Intravenous Once  . sodium chloride   Intravenous Once  . enoxaparin (LOVENOX) injection  40 mg Subcutaneous Q24H  . fluticasone  1 spray Each Nare Daily  . folic acid  1 mg Oral Daily  . ibuprofen  800 mg Oral TID  . mometasone-formoterol  2 puff Inhalation BID  . senna-docusate  1 tablet Oral QHS  . topiramate  50 mg Oral Q12H  . Vitamin D (Ergocalciferol)  50,000 Units Oral Q Sun   Time spent 30 minutes.

## 2015-04-16 NOTE — Progress Notes (Signed)
     Progress Note   Subjective  *Pain is improving**   Objective  Vital signs in last 24 hours: Temp:  [97.4 F (36.3 C)-98.6 F (37 C)] 98.1 F (36.7 C) (06/27 1434) Pulse Rate:  [82-91] 86 (06/27 1434) Resp:  [18-20] 20 (06/27 1434) BP: (121-128)/(70-83) 121/77 mmHg (06/27 1434) SpO2:  [93 %-100 %] 98 % (06/27 1434) FiO2 (%):  [21 %] 21 % (06/27 0833) Weight:  [148 lb 1.6 oz (67.178 kg)] 148 lb 1.6 oz (67.178 kg) (06/27 0535) Last BM Date: 04/14/15  General: Alert, well-developed,  in NAD Heart:  Regular rate and rhythm; no murmurs Chest: Clear to ascultation bilaterally Abdomen:  Soft, nontender and nondistended. Normal bowel sounds, without guarding, and without rebound.   Extremities:  Without edema. Neurologic:  Alert and  oriented x4; grossly normal neurologically. Psych:  Alert and cooperative. Normal mood and affect.  Intake/Output from previous day: 06/26 0701 - 06/27 0700 In: 1306.7 [P.O.:510; I.V.:796.7] Out: 400 [Urine:400] Intake/Output this shift:    Lab Results:  Recent Labs  04/15/15 0611  WBC 23.8*  HGB 7.1*  HCT 20.6*  PLT 165   BMET  Recent Labs  04/14/15 1247 04/15/15 0611  NA 137 135  K 4.2 4.1  CL 116* 116*  CO2 13* 14*  GLUCOSE 81 67  BUN 14 15  CREATININE <0.30* <0.30*  CALCIUM 8.8* 8.6*   LFT  Recent Labs  04/14/15 1247 04/15/15 0611  PROT 6.0* 5.7*  ALBUMIN 1.7* 1.6*  AST 427* 406*  ALT 123* 112*  ALKPHOS 540* 520*  BILITOT 40.6*  40.6* 33.0*  BILIDIR >30.0*  --   IBILI NOT CALCULATED  --    PT/INR No results for input(s): LABPROT, INR in the last 72 hours. Hepatitis Panel No results for input(s): HEPBSAG, HCVAB, HEPAIGM, HEPBIGM in the last 72 hours.  Studies/Results: No results found.    Assessment & Plan  **MRI suggests changes consistent with cirrhosis.  Etiology for this is not certain.  LFTs are abnormal, at least in part due to sickle cell crisis.  Plan to proceed with liver biopsy to help  determine the etiology for chronic liver disease. * Active Problems:   Dehydration   Jaundice, hemolytic   Frequent headaches   Hb-SS disease with crisis   Asthma, chronic   Hemolytic anemia     LOS: 7 days   Joan Martin  04/16/2015, 3:00 PM Pager [phone] 8a-5p weekdays Call [phone] weekends, holidays and 5p-8a or per Amion

## 2015-04-16 NOTE — Progress Notes (Signed)
SICKLE CELL SERVICE PROGRESS NOTE  Rogers Seedsatrice Meloy VWU:[id number] DOB: 07/01/1986 DOA: 2015-09-09 PCP: MATTHEWS,MICHELLE A., MD  Assessment/Plan: Active Problems:   Dehydration   Jaundice, hemolytic   Frequent headaches   Hb-SS disease with crisis   Asthma, chronic   Hemolytic anemia   1. Elevated Alkaline Phosphatase and AST: Liver enzymes continue to increase. We will defer to GI for further advice. 2. Hemolysis: Patient received 1 unit PRBC and hemoglobin has been stable. Continue to monitor H&H 3. Hb SS with crisis: I will transition to oral medications. Switched over from Toradol to ibuprofen.  4. Leukocytosis: Probably due to sickle cell crisis. We'll keep monitoring. 5. Chronic pain: Pt uses very little analgesic medication.  6. Sickle Cell Disease: We will resume hydroxyurea once hemoglobin remains stable above 7 g 7. H/O CVA: Continue close monitoring.  Code Status: Full Code Family Communication: Had in-depth discussion with patient and father Disposition Plan: Not yet ready for discharge  [facility]  Pager [phone]. If 7PM-7AM, please contact night-coverage.  04/15/2015, 2:55 PM  LOS: 6 days    Consultants:  Gastroenterology  Hematology  Procedures:  None  Antibiotics:  None  HPI/Subjective: Patient is feeling much better today even though her liver function has getting worse. Her headaches are persistent but much better also. No cough no fever no shortness of breath.  Objective: Filed Vitals:   04/11/15 0201 04/11/15 0501 04/11/15 0801  BP: 120/62 115/52   Pulse: 102 91   Temp: 98.7 F (37.1 C) 98.7 F (37.1 C)   TempSrc: Oral Oral   Resp: 16 20 18   Height:     Weight:  125 lb 14.4 oz (57.108 kg)   SpO2: 96% 97% 98%   Weight change: -0.181 kg (-6.4 oz)  Intake/Output Summary (Last 24 hours) at 04/15/15 0655 Last data filed at 04/15/15 0536  Gross per 24 hour  Intake 1306.67 ml  Output    400 ml  Net 906.67 ml    General: Alert, awake,  oriented x3, in no acute distress.  HEENT: [place]/AT PEERL, EOMI, with increased hemolysis.. Neck: Trachea midline,  no masses, no thyromegal,y no JVD, no carotid bruit OROPHARYNX:  Moist, No exudate/ erythema/lesions.  Heart: Regular rate and rhythm, without murmurs, rubs, gallops, PMI non-displaced, no heaves or thrills on palpation.  Lungs: Clear to auscultation, no wheezing or rhonchi noted. No increased vocal fremitus resonant to percussion  Abdomen: Soft, nontender, nondistended, positive bowel sounds, no masses mild hepatomegaly noted. Neuro: No focal neurological deficits noted cranial nerves II through XII grossly intact. Strength normal in bilateral upper and lower extremities. Musculoskeletal: No warm swelling or erythema around joints, no spinal tenderness noted. Psychiatric: Patient alert and oriented x3, good insight and cognition, good recent to remote recall.    Data Reviewed: Basic Metabolic Panel:  Recent Labs Lab 2015/03/14 1446 2015/03/14 1515 04/10/15 0805 04/11/15 0435  NA 133* 135 132* 134*  K 3.4* 3.4* 3.4* 3.4*  CL 112* 105 108 115*  CO2 14* 14* 14* 14*  GLUCOSE 95 102* 86 89  BUN 20 18 14 13   CREATININE 0.76 1.73* 0.49 0.49  CALCIUM 7.7* 8.4 7.7* 7.9*   Liver Function Tests:  Recent Labs Lab 2015/03/14 1446 04/10/15 0805  AST 223* 206*  ALT 81* 71*  ALKPHOS 353* 364*  BILITOT 27.5* 28.4*  PROT 6.2* 5.9*  ALBUMIN 2.0* 1.9*   No results for input(s): LIPASE, AMYLASE in the last 168 hours. No results for input(s): AMMONIA in the last 168 hours.  CBC:  Recent Labs Lab 04/11/2015 1446 04/11/2015 1515 04/10/15 0805 04/11/15 0435  WBC 13.7* 10.4 12.6* 14.9*  NEUTROABS 8.2*  --  7.8* 8.8*  HGB 5.8* 6.9* 6.8* 6.8*  HCT 16.1* 19.9* 19.9* 18.9*  MCV 101.9* 104.7* 98.0 97.4  PLT 176 212 190 176   Cardiac Enzymes: No results for input(s): CKTOTAL, CKMB, CKMBINDEX, TROPONINI in the last 168 hours. BNP (last 3 results) No results for input(s): BNP in the  last 8760 hours.  ProBNP (last 3 results) No results for input(s): PROBNP in the last 8760 hours.  CBG: No results for input(s): GLUCAP in the last 168 hours.  No results found for this or any previous visit (from the past 240 hour(s)).   Studies: Dg Chest 2 View  04/04/2015   CLINICAL DATA:  Shortness of breath.  Sickle cell anemia .  EXAM: CHEST  2 VIEW  COMPARISON:  07/23/2013.  FINDINGS: Mediastinum and hilar structures are normal. Mild interstitial prominence is stable. No focal pulmonary infiltrate noted. Mild cardiomegaly and pulmonary vascular prominence. No pleural effusion or pneumothorax. Cholecystectomy.  IMPRESSION: 1. Mild cardiomegaly and pulmonary vascular prominence. 2. Mild interstitial prominence is again noted. This may be chronic. No evidence of focal pulmonary infiltrate or overt pulmonary edema. No pleural effusion or pneumothorax.   Electronically Signed   By: Maisie Fus  Register   On: 04/04/2015 08:34     Scheduled Meds: . sodium chloride   Intravenous Once  . sodium chloride   Intravenous Once  . enoxaparin (LOVENOX) injection  40 mg Subcutaneous Q24H  . fluticasone  1 spray Each Nare Daily  . folic acid  1 mg Oral Daily  . ibuprofen  800 mg Oral TID  . mometasone-formoterol  2 puff Inhalation BID  . senna-docusate  1 tablet Oral QHS  . topiramate  50 mg Oral Q12H  . Vitamin D (Ergocalciferol)  50,000 Units Oral Q Sun   Time spent 30 minutes.

## 2015-04-16 NOTE — H&P (Signed)
Chief Complaint: Chief Complaint  Patient presents with  . Sickle Cell Pain Crisis    0/10    Referring Physician(s): Matthews,Michelle A  History of Present Illness: Joan Martin is a 29 y.o. 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 Pain has been lingering on for about 1 week.  She also reports poor PO intake for a week  She was found to have low hemoglobin at 5.8.  Her usual baseline is around 9-10.  She was also found to have high T Bili and dark scleral icterus suggesting gross hemolysis.    She was admitted for blood transfusion and IVF hydration.  MRI done suggests changed c/w cirrhosis  We are asked to evaluate patient for US guided random liver biopsy.  Past Medical History  Diagnosis Date  . Sickle cell anemia   . Stroke     7 years ago  . Sickle cell anemia   . Asthma     Past Surgical History  Procedure Laterality Date  . Cholecystectomy      Allergies: Cephalosporins; Citrus; and Morphine and related  Medications: Prior to Admission medications   Medication Sig Start Date End Date Taking? Authorizing Provider  albuterol (PROVENTIL) (2.5 MG/3ML) 0.083% nebulizer solution Take 3 mLs (2.5 mg total) by nebulization every 6 (six) hours as needed for shortness of breath. 12/01/13  Yes Massie Maroon, FNP  diphenhydrAMINE (BENADRYL) 25 MG tablet Take 25 mg by mouth every 6 (six) hours as needed for itching.   Yes Historical Provider, MD  Fluticasone-Salmeterol (ADVAIR) 250-50 MCG/DOSE AEPB Inhale 1 puff into the lungs daily.   Yes Historical Provider, MD  folic acid (FOLVITE) 1 MG tablet Take 1 tablet (1 mg total) by mouth daily. 01/03/14  Yes Massie Maroon, FNP  hydroxyurea (HYDREA) 500 MG capsule TAKE ONE CAPSULE BY MOUTH TWICE DAILY   Yes Altha Harm, MD  ibuprofen (ADVIL,MOTRIN) 600 MG tablet Take 1 tablet (600 mg total) by mouth every 8 (eight) hours as needed for pain. 07/27/13   Yes Grayce Sessions, NP  mometasone (NASONEX) 50 MCG/ACT nasal spray Place 2 sprays into the nose daily. Patient taking differently: Place 2 sprays into the nose at bedtime.  11/23/14  Yes Massie Maroon, FNP  ondansetron (ZOFRAN) 4 MG tablet Take 1 tablet (4 mg total) by mouth every 6 (six) hours. Patient taking differently: Take 4 mg by mouth every 6 (six) hours as needed for nausea or vomiting.  10/19/14  Yes Massie Maroon, FNP  oxyCODONE-acetaminophen (PERCOCET) 10-325 MG per tablet Take 1 tablet every 4 hours for severe pain Patient taking differently: Take 1 tablet by mouth every 4 (four) hours as needed for pain.  02/13/15  Yes Massie Maroon, FNP  promethazine (PHENERGAN) 25 MG tablet Take 25 mg by mouth every 6 (six) hours as needed for nausea.   Yes Historical Provider, MD  topiramate (TOPAMAX) 50 MG tablet Take 0.5tab BID x7d, then 1tab BID. Patient taking differently: Take 50 mg by mouth 2 (two) times daily.  01/17/15  Yes Drema Dallas, DO  SUMAtriptan (IMITREX) 25 MG tablet Take 1 tablet (25 mg total) by mouth once. May repeat in 2 hours if headache persists or recurs. Not to exceed 2 doses in 24 hours 10/19/14   Massie Maroon, FNP  Vitamin D, Ergocalciferol, (DRISDOL) 50000 UNITS CAPS capsule TAKE 1 CAPSULE BY MOUTH ONCE WEEKLY 04/10/15  Massie Maroon, FNP     Family History  Problem Relation Age of Onset  . Diabetes Mother   . Hypertension Mother   . Diabetes Father   . Hypertension Father   . Cancer Maternal Aunt   . Sickle cell anemia Sister     History   Social History  . Marital Status: Single    Spouse Name: N/A  . Number of Children: N/A  . Years of Education: N/A   Social History Main Topics  . Smoking status: Never Smoker   . Smokeless tobacco: Never Used  . Alcohol Use: No  . Drug Use: No  . Sexual Activity:    Partners: Male    Birth Control/ Protection: Condom   Other Topics Concern  . None   Social History Narrative    ECOG  Status: Review of Systems  Constitutional: Positive for activity change, appetite change and fatigue. Negative for fever and chills.  Respiratory: Negative for cough, chest tightness and shortness of breath.   Cardiovascular: Negative for chest pain.  Gastrointestinal: Negative for abdominal pain.  Genitourinary: Negative.   Musculoskeletal: Positive for back pain.  Skin: Positive for color change.  Neurological: Negative.   Psychiatric/Behavioral: Negative.     Vital Signs: BP 121/77 mmHg  Pulse 86  Temp(Src) 98.1 F (36.7 C) (Oral)  Resp 20  Ht 5\' 7"  (1.702 m)  Wt 148 lb 1.6 oz (67.178 kg)  BMI 23.19 kg/m2  SpO2 98%  PF   LMP 04/03/2015  Physical Exam  Constitutional: She is oriented to person, place, and time. She appears well-developed and well-nourished.  HENT:  Head: Normocephalic and atraumatic.  Eyes:  + Scleral icterus  Cardiovascular: Normal rate, regular rhythm and normal heart sounds.   Pulmonary/Chest: Effort normal and breath sounds normal. She has no wheezes.  Abdominal: Soft. Bowel sounds are normal. There is no tenderness.  Musculoskeletal: Normal range of motion.  Neurological: She is alert and oriented to person, place, and time.  Skin: Skin is warm and dry.  Psychiatric: She has a normal mood and affect. Her behavior is normal. Judgment and thought content normal.  Vitals reviewed.   Mallampati Score:  MD Evaluation Airway: WNL Heart: WNL Abdomen: WNL Chest/ Lungs: WNL ASA  Classification: 3 Mallampati/Airway Score: Two  Imaging: Dg Chest 2 View  04/04/2015   CLINICAL DATA:  Shortness of breath.  Sickle cell anemia .  EXAM: CHEST  2 VIEW  COMPARISON:  07/23/2013.  FINDINGS: Mediastinum and hilar structures are normal. Mild interstitial prominence is stable. No focal pulmonary infiltrate noted. Mild cardiomegaly and pulmonary vascular prominence. No pleural effusion or pneumothorax. Cholecystectomy.  IMPRESSION: 1. Mild cardiomegaly and  pulmonary vascular prominence. 2. Mild interstitial prominence is again noted. This may be chronic. No evidence of focal pulmonary infiltrate or overt pulmonary edema. No pleural effusion or pneumothorax.   Electronically Signed   By: Maisie Fus  Register   On: 04/04/2015 08:34   Mr Abdomen W Wo Contrast  04/12/2015   CLINICAL DATA:  29 year old female with history of sickle cell disease. Liver mass noted on recent ultrasound examination.  EXAM: MRI ABDOMEN WITHOUT AND WITH CONTRAST  TECHNIQUE: Multiplanar multisequence MR imaging of the abdomen was performed both before and after the administration of intravenous contrast.  CONTRAST:  11mL MULTIHANCE GADOBENATE DIMEGLUMINE 529 MG/ML IV SOLN  COMPARISON:  No priors.  FINDINGS: Lower chest:  Unremarkable.  Hepatobiliary: The liver is enlarged measuring 21.9 cm in craniocaudal span. The liver has a markedly  nodular contour, compatible with underlying cirrhosis. There is a macronodular area of the right lobe of the liver which is somewhat mass-like in appearance, however, this area is isointense to the remaining portions of the hepatic parenchyma on all pulse sequences, and enhances identically to the remaining hepatic parenchyma on post gadolinium images. Additionally, this appears to have normal vascular supply, without obvious distortion of the vessels around a mass. On arterial phase post gadolinium images, no hypervascular lesion is identified at this time. No intra or extrahepatic biliary ductal dilatation. Status post cholecystectomy.  Pancreas: No pancreatic mass. No pancreatic ductal dilatation. No pancreatic or peripancreatic fluid or inflammatory changes.  Spleen: The spleen is not visualized. 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  Adrenals/Urinary Tract: Bilateral adrenal glands are normal in appearance. Sub  cm lesions in the kidneys bilaterally are low T1 signal intensity, high T2 signal intensity, and do not enhance, compatible with tiny cysts. No hydroureteronephrosis in the visualized abdomen.  Stomach/Bowel: Visualized portions are unremarkable.  Vascular/Lymphatic: Mild dilatation of the portal vein which measures up to 15 mm in diameter proximally. No aneurysm identified in the visualized abdominal vasculature. No lymphadenopathy noted in the abdomen.  Other: Small volume of ascites.  Musculoskeletal: No aggressive osseous lesions noted in the visualized portions of the skeleton.  IMPRESSION: 1. The liver is enlarged, and there are morphologic changes compatible with underlying cirrhosis. The perceived mass in the right lobe of the liver corresponds to an usually macrolobulated portion of the hepatic parenchyma. This is an unusual but benign finding. No hypervascular lesion to suggest hepatocellular carcinoma at this time. 2. 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 3. 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   On: 04/12/2015 16:23   US Abdomen Limited  04/11/2015   CLINICAL DATA:  Elevated liver function tests. Sickle cell disease. Prior cholecystectomy.  EXAM: US ABDOMEN LIMITED - RIGHT UPPER QUADRANT  COMPARISON:  None.  FINDINGS: Gallbladder:  Removed.  Common bile duct:  Diameter: 3.4 mm, normal.  Liver:  There is a masslike lesion measuring 9.6 x 9.0 x 6.4 cm in the right lobe. The echogenicity is similar to liver but the mass appears to have a relatively definable margin. There is slight nodularity of the surface of the liver.  There is small amount of ascites in the abdomen.  IMPRESSION: Findings are suggestive of cirrhosis. Possible 9.6 cm mass in the right lobe of the liver. Ascites.   Electronically Signed   By: Francene Boyers M.D.   On: 04/11/2015 19:00     Labs:  CBC:  Recent Labs  04/11/15 0435 04/12/15 0449 04/13/15 0440 04/15/15 0611  WBC 14.9* 16.3* 18.6* 23.8*  HGB 6.8* 6.4* 7.6* 7.1*  HCT 18.9* 18.0* 22.2* 20.6*  PLT 176 172 170 165    COAGS:  Recent Labs  04/12/15 1300  INR 1.28  APTT 47*    BMP:  Recent Labs  04/12/15 1900 04/13/15 0440 04/14/15 1247 04/15/15 0611  NA 137 137 137 135  K 4.1 4.2 4.2 4.1  CL 114* 116* 116* 116*  CO2 15* 13* 13* 14*  GLUCOSE 90 68 81 67  BUN 13 13 14 15   CALCIUM 8.2* 8.2* 8.8* 8.6*  CREATININE <0.30* <0.30* <0.30* <0.30*  GFRNONAA NOT CALCULATED NOT CALCULATED NOT CALCULATED NOT CALCULATED  GFRAA NOT CALCULATED NOT CALCULATED NOT CALCULATED NOT CALCULATED    LIVER FUNCTION TESTS:  Recent Labs  04/12/15 1900 04/13/15 0440 04/14/15 1247 04/15/15 0611  BILITOT 33.5* 31.6* 40.6*  40.6* 33.0*  AST 274* 301* 427* 406*  ALT 88* 91* 123* 112*  ALKPHOS 498* 487* 540* 520*  PROT 6.1* 5.8* 6.0* 5.7*  ALBUMIN 1.7* 1.7* 1.7* 1.6*    TUMOR MARKERS:  Recent Labs  04/12/15 1014  AFPTM 6.3    Assessment and Plan:  Elevated TBili MRI with changes c/w cirrhosis  Will proceed with US guided liver biopsy tomorrow by Dr. Lowella Dandy.  Risks and Benefits discussed with the patient including, but not limited to bleeding, infection, damage to adjacent structures or low yield requiring additional tests. All of the patient's questions were answered, patient is agreeable to proceed. Consent signed and in chart.   Thank you for this interesting consult.  I greatly enjoyed meeting Joan Martin and look forward to participating in their care.  SignedGwynneth Macleod 04/16/2015, 4:15 PM   I spent a total of 20 Minutes   in face to face in clinical consultation, greater than 50% of which was counseling/coordinating care for US guided liver biopsy.

## 2015-04-17 ENCOUNTER — Inpatient Hospital Stay (HOSPITAL_COMMUNITY): Payer: Medicaid Other

## 2015-04-17 DIAGNOSIS — N179 Acute kidney failure, unspecified: Secondary | ICD-10-CM

## 2015-04-17 DIAGNOSIS — N19 Unspecified kidney failure: Secondary | ICD-10-CM

## 2015-04-17 DIAGNOSIS — R579 Shock, unspecified: Secondary | ICD-10-CM

## 2015-04-17 DIAGNOSIS — D588 Other specified hereditary hemolytic anemias: Secondary | ICD-10-CM

## 2015-04-17 DIAGNOSIS — J9601 Acute respiratory failure with hypoxia: Secondary | ICD-10-CM

## 2015-04-17 DIAGNOSIS — K7469 Other cirrhosis of liver: Secondary | ICD-10-CM

## 2015-04-17 DIAGNOSIS — K744 Secondary biliary cirrhosis: Secondary | ICD-10-CM

## 2015-04-17 DIAGNOSIS — E872 Acidosis: Secondary | ICD-10-CM

## 2015-04-17 LAB — BASIC METABOLIC PANEL
Anion gap: 12 (ref 5–15)
BUN: 19 mg/dL (ref 6–20)
CO2: 9 mmol/L — ABNORMAL LOW (ref 22–32)
CREATININE: 0.62 mg/dL (ref 0.44–1.00)
Calcium: 9.2 mg/dL (ref 8.9–10.3)
Chloride: 117 mmol/L — ABNORMAL HIGH (ref 101–111)
GFR calc non Af Amer: >60 mL/min (ref >60)
Glucose, Bld: 20 mg/dL — CL (ref 65–99)
Potassium: 6.4 mmol/L (ref 3.5–5.1)
Sodium: 138 mmol/L (ref 135–145)

## 2015-04-17 LAB — CBC WITH DIFFERENTIAL/PLATELET
BLASTS: 0 %
Band Neutrophils: 0 % (ref 0–10)
Basophils Absolute: 0 10*3/uL (ref 0.0–0.1)
Basophils Relative: 0 % (ref 0–1)
Eosinophils Absolute: 0 10*3/uL (ref 0.0–0.7)
Eosinophils Relative: 0 % (ref 0–5)
HCT: 19.7 % — ABNORMAL LOW (ref 36.0–46.0)
HEMOGLOBIN: 6.9 g/dL — AB (ref 12.0–15.0)
LYMPHS PCT: 41 % (ref 12–46)
Lymphs Abs: 9.9 10*3/uL — ABNORMAL HIGH (ref 0.7–4.0)
MCH: 33.8 pg (ref 26.0–34.0)
MCHC: 35 g/dL (ref 30.0–36.0)
MCV: 96.6 fL (ref 78.0–100.0)
MYELOCYTES: 7 %
Metamyelocytes Relative: 2 %
Monocytes Absolute: 1.9 10*3/uL — ABNORMAL HIGH (ref 0.1–1.0)
Monocytes Relative: 8 % (ref 3–12)
NEUTROS ABS: 12.3 10*3/uL — AB (ref 1.7–7.7)
Neutrophils Relative %: 42 % — ABNORMAL LOW (ref 43–77)
OTHER: 0 %
PROMYELOCYTES ABS: 0 %
Platelets: 152 10*3/uL (ref 150–400)
RBC: 2.04 MIL/uL — ABNORMAL LOW (ref 3.87–5.11)
RDW: 25.3 % — ABNORMAL HIGH (ref 11.5–15.5)
WBC: 24.1 10*3/uL — ABNORMAL HIGH (ref 4.0–10.5)
nRBC: 64 /100 WBC — ABNORMAL HIGH

## 2015-04-17 LAB — BLOOD GAS, ARTERIAL
Bicarbonate: 7.8 mEq/L — ABNORMAL LOW (ref 20.0–24.0)
DRAWN BY: 422461
O2 Content: 2 L/min
O2 SAT: 35.5 %
PATIENT TEMPERATURE: 95.2
pCO2 arterial: 27.2 mmHg — ABNORMAL LOW (ref 35.0–45.0)
pH, Arterial: 7.071 — CL (ref 7.350–7.450)
pO2, Arterial: 33.5 mmHg — CL (ref 80.0–100.0)

## 2015-04-17 LAB — PROTIME-INR
INR: 2.09 — ABNORMAL HIGH (ref 0.00–1.49)
PROTHROMBIN TIME: 23.3 s — AB (ref 11.6–15.2)

## 2015-04-17 LAB — COMPREHENSIVE METABOLIC PANEL
ALT: 101 U/L — AB (ref 14–54)
AST: 347 U/L — ABNORMAL HIGH (ref 15–41)
Albumin: 1.5 g/dL — ABNORMAL LOW (ref 3.5–5.0)
Alkaline Phosphatase: 506 U/L — ABNORMAL HIGH (ref 38–126)
Anion gap: 5 (ref 5–15)
BUN: 15 mg/dL (ref 6–20)
CALCIUM: 9.1 mg/dL (ref 8.9–10.3)
CO2: 15 mmol/L — ABNORMAL LOW (ref 22–32)
CREATININE: 0.32 mg/dL — AB (ref 0.44–1.00)
Chloride: 116 mmol/L — ABNORMAL HIGH (ref 101–111)
GFR calc Af Amer: >60 mL/min (ref >60)
GFR calc non Af Amer: >60 mL/min (ref >60)
GLUCOSE: 79 mg/dL (ref 65–99)
Potassium: 4.1 mmol/L (ref 3.5–5.1)
SODIUM: 136 mmol/L (ref 135–145)
TOTAL PROTEIN: 5.6 g/dL — AB (ref 6.5–8.1)
Total Bilirubin: 27.9 mg/dL (ref 0.3–1.2)

## 2015-04-17 LAB — CBC
HCT: 13.8 % — ABNORMAL LOW (ref 36.0–46.0)
HEMOGLOBIN: 4.7 g/dL — AB (ref 12.0–15.0)
MCH: 33.3 pg (ref 26.0–34.0)
MCHC: 34.1 g/dL (ref 30.0–36.0)
MCV: 97.9 fL (ref 78.0–100.0)
PLATELETS: 89 10*3/uL — AB (ref 150–400)
RBC: 1.41 MIL/uL — ABNORMAL LOW (ref 3.87–5.11)
RDW: 25.1 % — ABNORMAL HIGH (ref 11.5–15.5)
WBC: 30.5 10*3/uL — ABNORMAL HIGH (ref 4.0–10.5)

## 2015-04-17 LAB — RETICULOCYTES
RBC.: 1.42 MIL/uL — ABNORMAL LOW (ref 3.87–5.11)
Retic Count, Absolute: 276.9 K/uL — ABNORMAL HIGH (ref 19.0–186.0)
Retic Ct Pct: 19.5 % — ABNORMAL HIGH (ref 0.4–3.1)

## 2015-04-17 LAB — LACTATE DEHYDROGENASE: LDH: 1322 U/L — AB (ref 98–192)

## 2015-04-17 LAB — LACTIC ACID, PLASMA: LACTIC ACID, VENOUS: 7.6 mmol/L — AB (ref 0.5–2.0)

## 2015-04-17 LAB — HEMOGLOBINOPATHY EVALUATION
HGB F QUANT: 14.2 % — AB (ref 0.0–2.0)
HGB S QUANTITAION: 49 % — AB
Hgb A2 Quant: 3.1 % (ref 0.7–3.1)
Hgb A: 33.7 % — ABNORMAL LOW (ref 94.0–98.0)
Hgb C: 0 %

## 2015-04-17 LAB — MRSA PCR SCREENING: MRSA by PCR: NEGATIVE

## 2015-04-17 LAB — CORTISOL: Cortisol, Plasma: 26.1 ug/dL

## 2015-04-17 LAB — PREPARE RBC (CROSSMATCH)

## 2015-04-17 MED ORDER — SODIUM BICARBONATE 8.4 % IV SOLN
INTRAVENOUS | Status: DC
  Administered 2015-04-17: 22:00:00 via INTRAVENOUS
  Filled 2015-04-17 (×4): qty 150

## 2015-04-17 MED ORDER — FENTANYL CITRATE (PF) 100 MCG/2ML IJ SOLN
50.0000 ug | Freq: Once | INTRAMUSCULAR | Status: AC
  Administered 2015-04-17: 50 ug via INTRAVENOUS
  Filled 2015-04-17: qty 2

## 2015-04-17 MED ORDER — ETOMIDATE 2 MG/ML IV SOLN
INTRAVENOUS | Status: AC
  Filled 2015-04-17: qty 20

## 2015-04-17 MED ORDER — DEXTROSE 50 % IV SOLN
INTRAVENOUS | Status: AC
Start: 2015-04-17 — End: 2015-04-17
  Filled 2015-04-17: qty 50

## 2015-04-17 MED ORDER — SODIUM BICARBONATE 8.4 % IV SOLN
INTRAVENOUS | Status: AC
  Filled 2015-04-17: qty 50

## 2015-04-17 MED ORDER — DEXTROSE 10 % IV SOLN
INTRAVENOUS | Status: DC
  Administered 2015-04-17 – 2015-04-19 (×4): via INTRAVENOUS
  Administered 2015-04-20: 500 mL via INTRAVENOUS
  Administered 2015-04-21: 01:00:00 via INTRAVENOUS
  Filled 2015-04-17 (×2): qty 1000

## 2015-04-17 MED ORDER — SUCCINYLCHOLINE CHLORIDE 20 MG/ML IJ SOLN
INTRAMUSCULAR | Status: AC
  Filled 2015-04-17: qty 1

## 2015-04-17 MED ORDER — SODIUM CHLORIDE 0.9 % IV SOLN
Freq: Once | INTRAVENOUS | Status: AC
  Administered 2015-04-18: via INTRAVENOUS

## 2015-04-17 MED ORDER — MIDAZOLAM HCL 2 MG/2ML IJ SOLN
2.0000 mg | INTRAMUSCULAR | Status: DC | PRN
  Administered 2015-04-18: 2 mg via INTRAVENOUS
  Filled 2015-04-17: qty 2

## 2015-04-17 MED ORDER — DEXTROSE 10 % IV BOLUS
50.0000 mL | Freq: Once | INTRAVENOUS | Status: AC
  Administered 2015-04-17: 50 mL via INTRAVENOUS

## 2015-04-17 MED ORDER — GELATIN ABSORBABLE 12-7 MM EX MISC
CUTANEOUS | Status: AC | PRN
  Administered 2015-04-17: 1

## 2015-04-17 MED ORDER — MIDAZOLAM HCL 2 MG/2ML IJ SOLN
INTRAMUSCULAR | Status: AC
  Filled 2015-04-17: qty 4

## 2015-04-17 MED ORDER — SODIUM POLYSTYRENE SULFONATE 15 GM/60ML PO SUSP
30.0000 g | Freq: Once | ORAL | Status: AC
  Administered 2015-04-17: 30 g via RECTAL

## 2015-04-17 MED ORDER — SODIUM BICARBONATE 8.4 % IV SOLN
50.0000 meq | Freq: Once | INTRAVENOUS | Status: DC
  Filled 2015-04-17: qty 100

## 2015-04-17 MED ORDER — TOPIRAMATE 25 MG PO TABS
25.0000 mg | ORAL_TABLET | Freq: Two times a day (BID) | ORAL | Status: DC
Start: 2015-04-17 — End: 2015-04-18
  Administered 2015-04-17: 25 mg via ORAL
  Filled 2015-04-17 (×4): qty 1

## 2015-04-17 MED ORDER — FENTANYL CITRATE (PF) 2500 MCG/50ML IJ SOLN
25.0000 ug/h | INTRAMUSCULAR | Status: DC
  Administered 2015-04-17: 50 ug/h via INTRAVENOUS
  Administered 2015-04-18: 250 ug/h via INTRAVENOUS
  Administered 2015-04-19: 200 ug/h via INTRAVENOUS
  Administered 2015-04-20: 150 ug/h via INTRAVENOUS
  Administered 2015-04-21: 100 ug/h via INTRAVENOUS
  Filled 2015-04-17 (×6): qty 50

## 2015-04-17 MED ORDER — DEXTROSE 50 % IV SOLN
INTRAVENOUS | Status: AC
  Administered 2015-04-18: 25 mL
  Filled 2015-04-17: qty 50

## 2015-04-17 MED ORDER — ROCURONIUM BROMIDE 50 MG/5ML IV SOLN
70.0000 mg/kg | Freq: Once | INTRAVENOUS | Status: AC
  Administered 2015-04-17: 70 mg via INTRAVENOUS
  Filled 2015-04-17: qty 470.4

## 2015-04-17 MED ORDER — BISACODYL 10 MG RE SUPP: 10.0000 mg | Freq: Every day | RECTAL | Status: DC | PRN

## 2015-04-17 MED ORDER — VANCOMYCIN HCL IN DEXTROSE 1-5 GM/200ML-% IV SOLN: 1000.0000 mg | Freq: Three times a day (TID) | INTRAVENOUS | Status: DC

## 2015-04-17 MED ORDER — DEXTROSE 5 % IV SOLN
2.0000 g | Freq: Three times a day (TID) | INTRAVENOUS | Status: DC
  Administered 2015-04-18: 2 g via INTRAVENOUS
  Filled 2015-04-17 (×2): qty 2

## 2015-04-17 MED ORDER — MIDAZOLAM HCL 2 MG/2ML IJ SOLN
INTRAMUSCULAR | Status: AC
  Filled 2015-04-17: qty 6

## 2015-04-17 MED ORDER — SODIUM POLYSTYRENE SULFONATE 15 GM/60ML PO SUSP
30.0000 g | Freq: Once | ORAL | Status: DC
  Filled 2015-04-17: qty 120

## 2015-04-17 MED ORDER — DOCUSATE SODIUM 50 MG/5ML PO LIQD: 100.0000 mg | Freq: Two times a day (BID) | ORAL | Status: DC | PRN

## 2015-04-17 MED ORDER — HYDROMORPHONE HCL 1 MG/ML IJ SOLN
0.5000 mg | INTRAMUSCULAR | Status: DC | PRN
  Administered 2015-04-17: 0.5 mg via INTRAVENOUS
  Filled 2015-04-17: qty 1

## 2015-04-17 MED ORDER — SODIUM BICARBONATE 8.4 % IV SOLN
100.0000 meq | Freq: Once | INTRAVENOUS | Status: AC
  Administered 2015-04-17: 100 meq via INTRAVENOUS
  Filled 2015-04-17: qty 50

## 2015-04-17 MED ORDER — CALCIUM GLUCONATE 10 % IV SOLN
1.0000 g | Freq: Once | INTRAVENOUS | Status: AC
  Administered 2015-04-18: 1 g via INTRAVENOUS

## 2015-04-17 MED ORDER — ETOMIDATE 2 MG/ML IV SOLN
20.0000 mg | Freq: Once | INTRAVENOUS | Status: AC
  Administered 2015-04-17: 20 mg via INTRAVENOUS
  Filled 2015-04-17: qty 10

## 2015-04-17 MED ORDER — MIDAZOLAM HCL 2 MG/2ML IJ SOLN
4.0000 mg | Freq: Once | INTRAMUSCULAR | Status: AC
  Administered 2015-04-17: 2 mg via INTRAVENOUS

## 2015-04-17 MED ORDER — CETYLPYRIDINIUM CHLORIDE 0.05 % MT LIQD
7.0000 mL | Freq: Four times a day (QID) | OROMUCOSAL | Status: DC
  Administered 2015-04-18 – 2015-04-21 (×12): 7 mL via OROMUCOSAL

## 2015-04-17 MED ORDER — FENTANYL CITRATE (PF) 100 MCG/2ML IJ SOLN
INTRAMUSCULAR | Status: AC | PRN
  Administered 2015-04-17: 50 ug via INTRAVENOUS
  Administered 2015-04-17: 25 ug via INTRAVENOUS

## 2015-04-17 MED ORDER — SODIUM CHLORIDE 0.9 % IV BOLUS (SEPSIS)
1000.0000 mL | Freq: Once | INTRAVENOUS | Status: AC
  Administered 2015-04-17: 1000 mL via INTRAVENOUS

## 2015-04-17 MED ORDER — CHLORHEXIDINE GLUCONATE 0.12 % MT SOLN
15.0000 mL | Freq: Two times a day (BID) | OROMUCOSAL | Status: DC
  Administered 2015-04-18 – 2015-04-21 (×6): 15 mL via OROMUCOSAL
  Filled 2015-04-17 (×6): qty 15

## 2015-04-17 MED ORDER — MIDAZOLAM HCL 2 MG/2ML IJ SOLN: 2.0000 mg | INTRAMUSCULAR | Status: DC | PRN

## 2015-04-17 MED ORDER — ROCURONIUM BROMIDE 50 MG/5ML IV SOLN
INTRAVENOUS | Status: AC
  Filled 2015-04-17: qty 2

## 2015-04-17 MED ORDER — LIDOCAINE HCL (CARDIAC) 20 MG/ML IV SOLN
INTRAVENOUS | Status: AC
  Filled 2015-04-17: qty 5

## 2015-04-17 MED ORDER — FAMOTIDINE IN NACL 20-0.9 MG/50ML-% IV SOLN: 20.0000 mg | Freq: Two times a day (BID) | INTRAVENOUS | Status: DC

## 2015-04-17 MED ORDER — FENTANYL CITRATE (PF) 100 MCG/2ML IJ SOLN
INTRAMUSCULAR | Status: AC
  Filled 2015-04-17: qty 4

## 2015-04-17 MED ORDER — MIDAZOLAM HCL 2 MG/2ML IJ SOLN
INTRAMUSCULAR | Status: AC | PRN
  Administered 2015-04-17 (×3): 0.5 mg via INTRAVENOUS
  Administered 2015-04-17: 1 mg via INTRAVENOUS
  Administered 2015-04-17: 0.5 mg via INTRAVENOUS

## 2015-04-17 MED ORDER — FENTANYL CITRATE (PF) 100 MCG/2ML IJ SOLN
100.0000 ug | Freq: Once | INTRAMUSCULAR | Status: DC
  Filled 2015-04-17: qty 2

## 2015-04-17 MED ORDER — NOREPINEPHRINE BITARTRATE 1 MG/ML IV SOLN
2.0000 ug/min | INTRAVENOUS | Status: DC
  Administered 2015-04-18 (×2): 5 ug/min via INTRAVENOUS
  Filled 2015-04-17 (×3): qty 4

## 2015-04-17 MED ORDER — FENTANYL BOLUS VIA INFUSION
50.0000 ug | INTRAVENOUS | Status: DC | PRN
  Administered 2015-04-18 (×2): 50 ug via INTRAVENOUS
  Filled 2015-04-17: qty 50

## 2015-04-17 NOTE — Consult Note (Signed)
PULMONARY / CRITICAL CARE MEDICINE   Name: Joan Martin MRN: [id number] DOB: 12-23-1985    ADMISSION DATE:  04/06/2015 CONSULTATION DATE:  04/17/2015  REFERRING MD :  Dr. Ashley Royalty  CHIEF COMPLAINT: sickle cell crisis  INITIAL PRESENTATION: 29 year old female with sickle cell anemia admitted for crisis 6/20. She was feeling better initially, however liver function continued to worsen. Had biopsy to further evaluate 6/28. Later than PM she had acute decompensation and acidosis. PCCM consulted.   STUDIES:  6/22 US abdomen > Findings are suggestive of cirrhosis. Possible 9.6 cm mass in the right lobe of the liver. Ascites. 6/23 MRI abdomen > The liver is enlarged, and there are morphologic changes compatible with underlying cirrhosis. The perceived mass in the right lobe of the liver corresponds to an usually macrolobulated portion of the hepatic parenchyma. This is an unusual but benign finding. No hypervascular lesion to suggest hepatocellular carcinoma at this time. 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 She likened this pain to her sickle cell disease. She was admitted for sickle cell crisis and was treated with volume and pain management. The next day her pain was much improved and she also was transfused for Hgb 5.8. Also with some elevated LFTs, which were then trended and remained elevated. US abdomen was ordered and showed liver mass. GI evaluated and requested MRI, which did not suggest malignancy. AFP found to be normal. 6/17 LFTs continued to increase and cirrhosis workup  was underway by GI. She felt much better despite this. 6/28 she underwent IR guided liver biopsy, and then in the evening became markedly short of breath with hypoxemia. This improved with some supplemental O2, however, she was then noted to have worsening encephalopathy, back pain, acidosis and hypotension. She was profoundly tachypneic.  PCCM asked to assist.  Her nurses and family note that her back pain came back today before the biopsy.   PAST MEDICAL HISTORY :   has a past medical history of Sickle cell anemia; Stroke; Sickle cell anemia; and Asthma.  has past surgical history that includes Cholecystectomy. Prior to Admission medications   Medication Sig Start Date End Date Taking? Authorizing Provider  albuterol (PROVENTIL) (2.5 MG/3ML) 0.083% nebulizer solution Take 3 mLs (2.5 mg total) by nebulization every 6 (six) hours as needed for shortness of breath. 12/01/13  Yes Massie Maroon, FNP  diphenhydrAMINE (BENADRYL) 25 MG tablet Take 25 mg by mouth every 6 (six) hours as needed for itching.   Yes Historical Provider, MD  Fluticasone-Salmeterol (ADVAIR) 250-50 MCG/DOSE AEPB Inhale 1 puff into the lungs daily.   Yes Historical Provider, MD  folic acid (FOLVITE) 1 MG tablet Take 1 tablet (1 mg total) by mouth daily. 01/03/14  Yes Massie Maroon, FNP  hydroxyurea (HYDREA) 500 MG capsule TAKE ONE CAPSULE BY MOUTH TWICE DAILY   Yes Altha Harm, MD  ibuprofen (ADVIL,MOTRIN) 600 MG tablet Take 1 tablet (600 mg total) by mouth every 8 (eight) hours as needed for pain. 07/27/13  Yes Grayce Sessions, NP  mometasone (NASONEX) 50 MCG/ACT nasal spray Place 2 sprays into  the nose daily. Patient taking differently: Place 2 sprays into the nose at bedtime.  11/23/14  Yes Massie Maroon, FNP  ondansetron (ZOFRAN) 4 MG tablet Take 1 tablet (4 mg total) by mouth every 6 (six) hours. Patient taking differently: Take 4 mg by mouth every 6 (six) hours as needed for nausea or vomiting.  10/19/14  Yes  Massie Maroon, FNP  oxyCODONE-acetaminophen (PERCOCET) 10-325 MG per tablet Take 1 tablet every 4 hours for severe pain Patient taking differently: Take 1 tablet by mouth every 4 (four) hours as needed for pain.  02/13/15  Yes Massie Maroon, FNP  promethazine (PHENERGAN) 25 MG tablet Take 25 mg by mouth every 6 (six) hours as needed for nausea.   Yes Historical Provider, MD  topiramate (TOPAMAX) 50 MG tablet Take 0.5tab BID x7d, then 1tab BID. Patient taking differently: Take 50 mg by mouth 2 (two) times daily.  01/17/15  Yes Drema Dallas, DO  SUMAtriptan (IMITREX) 25 MG tablet Take 1 tablet (25 mg total) by mouth once. May repeat in 2 hours if headache persists or recurs. Not to exceed 2 doses in 24 hours 10/19/14   Massie Maroon, FNP  Vitamin D, Ergocalciferol, (DRISDOL) 50000 UNITS CAPS capsule TAKE 1 CAPSULE BY MOUTH ONCE WEEKLY 04/10/15   Massie Maroon, FNP   Allergies  Allergen Reactions  . Cephalosporins     Possible family reaction  . Citrus Other (See Comments)    Severity varies/ Reaction varies hives around mouth to swelling around mouth and lips.   . Morphine And Related Hives    Patient states she can take IV dilaudid with benadryl    FAMILY HISTORY:  has no family status information on file.  SOCIAL HISTORY:  reports that she has never smoked. She has never used smokeless tobacco. She reports that she does not drink alcohol or use illicit drugs.  REVIEW OF SYSTEMS:  Cannot obtain due to encephalopathy  SUBJECTIVE:   VITAL SIGNS: Temp:  [97.4 F (36.3 C)-97.7 F (36.5 C)] 97.4 F (36.3 C) (06/28 1819) Pulse Rate:  [77-101] 101 (06/28 1850) Resp:  [20-39] 39 (06/28 1850) BP: (99-141)/(19-87) 99/40 mmHg (06/28 1857) SpO2:  [89 %-100 %] 97 % (06/28 1857) HEMODYNAMICS:   VENTILATOR SETTINGS:   INTAKE / OUTPUT:  Intake/Output Summary (Last 24 hours) at 04/17/15 2125 Last data filed at 04/17/15 1811  Gross per 24 hour  Intake    240 ml  Output   1140 ml   Net   -900 ml    PHYSICAL EXAMINATION: General:  Tachypneic, in bed Neuro:  Follows simple commands, opens eyes to voice HEENT:  NCAT, Sclera anicteric Cardiovascular:  RRR, systolic murmur Lungs:  CTA, kussmauls respirations, accessory muscle use, paradoxical respirations Abdomen:  Distended, tender, band aid in place over liver biopsy site Musculoskeletal:  Normal bulk and tone Skin:  jaundice  LABS:  CBC  Recent Labs Lab 04/15/15 0611 04/17/15 0353 04/17/15 2037  WBC 23.8* 24.1* 30.5*  HGB 7.1* 6.9* 4.7*  HCT 20.6* 19.7* 13.8*  PLT 165 152 89*   Coag's  Recent Labs Lab 04/12/15 1300  APTT 47*  INR 1.28   BMET  Recent Labs Lab 04/15/15 0611 04/17/15 0353 04/17/15 2037  NA 135 136 138  K 4.1 4.1 6.4*  CL 116* 116* 117*  CO2 14* 15* 9*  BUN 15 15 19   CREATININE <0.30* 0.32* 0.62  GLUCOSE 67 79 20*   Electrolytes  Recent Labs Lab 04/15/15  [id number] 04/17/15 0353 04/17/15 2037  CALCIUM 8.6* 9.1 9.2   Sepsis Markers No results for input(s): LATICACIDVEN, PROCALCITON, O2SATVEN in the last 168 hours. ABG  Recent Labs Lab 04/17/15 2051  PHART 7.071*  PCO2ART 27.2*  PO2ART 33.5*   Liver Enzymes  Recent Labs Lab 04/14/15 1247 04/15/15 0611 04/17/15 0353  AST 427* 406* 347*  ALT 123* 112* 101*  ALKPHOS 540* 520* 506*  BILITOT 40.6*  40.6* 33.0* 27.9*  ALBUMIN 1.7* 1.6* 1.5*   Cardiac Enzymes No results for input(s): TROPONINI, PROBNP in the last 168 hours. Glucose No results for input(s): GLUCAP in the last 168 hours.  Imaging [place] Biopsy  04/17/2015   CLINICAL DATA:  29 year old with sickle cell anemia and cirrhosis. Request for liver biopsy.  EXAM: ULTRASOUND GUIDED LIVER BIOPSY  Physician: Rachelle HoraAdam R. Lowella DandyHenn, MD  FLUOROSCOPY TIME:  None  MEDICATIONS: 3 mg Versed, 75 mcg fentanyl. A radiology nurse monitored the patient for moderate sedation.  ANESTHESIA/SEDATION: Moderate sedation time: 20 minutes  PROCEDURE: The procedure was explained to the  patient. The risks and benefits of the procedure were discussed and the patient's questions were addressed. Informed consent was obtained from the patient. The liver was evaluated with ultrasound. Left hepatic lobe was selected for biopsy. The anterior abdomen was prepped and draped in sterile fashion. 1% lidocaine was used for local anesthetic. Using ultrasound guidance, 17 gauge needle was directed into the left hepatic lobe. Three core biopsies were obtained with an 18 gauge core device. The tract was embolized with Gel-Foam. Bandage placed over the puncture site.  FINDINGS: The liver has a nodular contour. Small amount of ascites along the right side of the liver. Left hepatic lobe was selected for biopsy because there was no adjacent ascites. No evidence for bleeding or hematoma following the core biopsies.  Estimated blood loss: Minimal  COMPLICATIONS: None  IMPRESSION: Ultrasound-guided core biopsies of the left hepatic lobe.   Electronically Signed   By: Richarda OverlieAdam  Henn M.D.   On: 04/17/2015 13:02   Dg Chest Port 1 View  04/17/2015   CLINICAL DATA:  Shortness of Breath  EXAM: PORTABLE CHEST - 1 VIEW  COMPARISON:  April 03, 2015  FINDINGS: Degree of inspiration is shallow. There is no edema or consolidation. Heart size and pulmonary vascularity are within normal limits given the degree of shallow inspiratory effort. No adenopathy. No bone lesions. No pneumothorax.  IMPRESSION: No edema or consolidation.  Note shallow degree of inspiration.   Electronically Signed   By: Bretta BangWilliam  Woodruff III M.D.   On: 04/17/2015 20:21   Dg Abd Portable 1v  04/17/2015   CLINICAL DATA:  29 year old female abdominal pain and distention x2 weeks.  EXAM: PORTABLE ABDOMEN - 1 VIEW  COMPARISON:  Abdominal ultrasound dated 03-17-2015 and MRI dated 04/12/2015.  FINDINGS: This is a nonspecific bowel gas pattern. Moderate stool noted throughout the colon. Right upper quadrant cholecystectomy clips. The osseous structures appear  unremarkable.  IMPRESSION: Nonspecific bowel gas pattern without definite evidence of obstruction.   Electronically Signed   By: Elgie CollardArash  Radparvar M.D.   On: 04/17/2015 20:26     ASSESSMENT / PLAN:  PULMONARY OETT 6/28 > A: Acute respiratory failure> due to inability to compensate from metabolic acidosis P:   Intubate now Full vent support CXR post intubation  CARDIOVASCULAR CVL 6/28 R IJ>> A: Hypotension> worrisome for hemorrhagic shock given liver biopsy today and belly exam; ddx includes septic shock with rising WBC count but no clear sign of  infection P:  Place CVL Measure CVP Fluid bolus Levophed for MAP > 65 Tele Check procalcitonin Check cortisol 12 lead EKG given hyperkalemia  RENAL A:  AKI based on doubling of Cr today Acute metabolic acidosis> presumably lactic acidosis from sickle crisis or shock  Hyperkalemia> either due to acidosis or worsening renal failure P:   Hyperkalemia treatment> kayexelate, calcium gluconate, D50, hold insulin given hypoglycemia Repeat BMET Check lactic acid Bicarb gtt for now May need CVVHD  GASTROINTESTINAL A:  Liver laceration? Cirrhosis presumably from sickle cell disease, s/p liver biopsy today P:   Stat CT abdomen NPO for now F/u GI recs  HEMATOLOGIC A:  Acute anemia, rapidly worsening> ddx hemorrhage from liver biopsy vs acute hemolysis from sickle crisis P:  Transfuse 2 U PRBC now Stat CT abdomen Repeat smear, Retic If no bleeding on CT abdomen, then consider exchange transfusion given rapid worsening in renal failure (presumably related to sickle crisis  INFECTIOUS A:  No clear sign of infection but considering rapid decline, critical condition with leukocytosis will empirically cover for sepsis P:   BCx2 6/28 > UC 6/28 > Sputum 6/28 >  Vanc 6/28 >  Aztreonam 6/28 >  ENDOCRINE A:  Hypoglycemia> presumably related to liver injury P:   D5 with Bicarb gtt Continue D10 for now, but will stop first when  glucose normalizes Frequent glucose checks (q2h)  NEUROLOGIC A:   Acute encephalopathy> due to shock? Repeat stroke? Due to sedation from perc biopsy with cirrhosis? P:   CT head with CT abdomen pelvis Fentanyl gtt for sedation needs on vent, titrate to RASS -1    FAMILY  - Updates: father updated bedside by me, mother by phone 6/28   My cc time 90 minutes   Heber Gallitzin, MD [place] [facility] Pager: [phone] Cell: [phone] After 3pm or if no response, call [phone]  04/17/2015, 9:25 PM

## 2015-04-17 NOTE — Progress Notes (Signed)
CRITICAL VALUE ALERT  Critical value received:  hgb 6.9, total billirubin 27.9  Date of notification:  04/17/2015  Time of notification:  0520, 0536  Critical value read back:Yes.    Nurse who received alert:  Hedda SladeJennifer Cheyene Hamric  MD notified (1st page):  On call hospitalist Schorr  Time of first page:  [phone]  MD notified (2nd page):  Time of second page:  Responding MD:  Hospitalist  Time MD responded:  0600

## 2015-04-17 NOTE — Procedures (Signed)
US guided core biopsies of liver.  3 cores obtained from left hepatic lobe.  No immediate complication.   Minimal blood loss.

## 2015-04-17 NOTE — Progress Notes (Signed)
eLink Physician-Brief Progress Note Patient Name: Joan Martin DOB: 04/23/1986 MRN: [id number]   Date of Service  04/17/2015  HPI/Events of Note  Now hyothermic and hypotensive  eICU Interventions  Fluid bolus bair hugger     Intervention Category Intermediate Interventions: Hypotension - evaluation and management  Milta Croson 04/17/2015, 8:37 PM

## 2015-04-17 NOTE — Progress Notes (Signed)
eLink Physician-Brief Progress Note Patient Name: Joan Martin DOB: June 13, 1986 MRN: [id number]  Date of Service  04/17/2015   HPI/Events of Note  New eval Calm RR 30 Appears to be in some pain Labs - below - anemic < 7gm%, bic 15  eICU Interventions  ACIDOTIC Mild resp distress PAIN  Plan Check abg Check lactic acidosis Hold off prbc - to be decided by sicke cell team      [facility] 04/17/2015, 8:09 PM   PULMONARY No results for input(s): PHART, PCO2ART, PO2ART, HCO3, TCO2, O2SAT in the last 168 hours.  Invalid input(s): PCO2, PO2  CBC  Recent Labs Lab 04/13/15 0440 04/15/15 0611 04/17/15 0353  HGB 7.6* 7.1* 6.9*  HCT 22.2* 20.6* 19.7*  WBC 18.6* 23.8* 24.1*  PLT 170 165 152    COAGULATION  Recent Labs Lab 04/12/15 1300  INR 1.28    CARDIAC  No results for input(s): TROPONINI in the last 168 hours. No results for input(s): PROBNP in the last 168 hours.   CHEMISTRY  Recent Labs Lab 04/12/15 1900 04/13/15 0440 04/14/15 1247 04/15/15 0611 04/17/15 0353  NA 137 137 137 135 136  K 4.1 4.2 4.2 4.1 4.1  CL 114* 116* 116* 116* 116*  CO2 15* 13* 13* 14* 15*  GLUCOSE 90 68 81 67 79  BUN CREATININE <0.30* <0.30* <0.30* <0.30* 0.32*  CALCIUM 8.2* 8.2* 8.8* 8.6* 9.1   Estimated Creatinine Clearance: 100 mL/min (by C-G formula based on Cr of 0.32).   LIVER  Recent Labs Lab 04/12/15 1300 04/12/15 1900 04/13/15 0440 04/14/15 1247 04/15/15 0611 04/17/15 0353  AST  --  274* 301* 427* 406* 347*  ALT  --  88* 91* 123* 112* 101*  ALKPHOS  --  498* 487* 540* 520* 506*  BILITOT  --  33.5* 31.6* 40.6*  40.6* 33.0* 27.9*  PROT  --  6.1* 5.8* 6.0* 5.7* 5.6*  ALBUMIN  --  1.7* 1.7* 1.7* 1.6* 1.5*  INR 1.28  --   --   --   --   --      INFECTIOUS No results for input(s): LATICACIDVEN, PROCALCITON in the last 168 hours.   ENDOCRINE CBG (last 3)  No results for input(s): GLUCAP in the last 72  hours.       IMAGING x48h  - image(s) personally visualized  -   highlighted in bold US Biopsy  04/17/2015   CLINICAL DATA:  29 year old with sickle cell anemia and cirrhosis. Request for liver biopsy.  EXAM: ULTRASOUND GUIDED LIVER BIOPSY  Physician: Rachelle Hora. Lowella Dandy, MD  FLUOROSCOPY TIME:  None  MEDICATIONS: 3 mg Versed, 75 mcg fentanyl. A radiology nurse monitored the patient for moderate sedation.  ANESTHESIA/SEDATION: Moderate sedation time: 20 minutes  PROCEDURE: The procedure was explained to the patient. The risks and benefits of the procedure were discussed and the patient's questions were addressed. Informed consent was obtained from the patient. The liver was evaluated with ultrasound. Left hepatic lobe was selected for biopsy. The anterior abdomen was prepped and draped in sterile fashion. 1% lidocaine was used for local anesthetic. Using ultrasound guidance, 17 gauge needle was directed into the left hepatic lobe. Three core biopsies were obtained with an 18 gauge core device. The tract was embolized with Gel-Foam. Bandage placed over the puncture site.  FINDINGS: The liver has a nodular contour. Small amount of ascites along the right side of the liver. Left hepatic lobe was selected for biopsy because there  was no adjacent ascites. No evidence for bleeding or hematoma following the core biopsies.  Estimated blood loss: Minimal  COMPLICATIONS: None  IMPRESSION: Ultrasound-guided core biopsies of the left hepatic lobe.   Electronically Signed   By: Richarda OverlieAdam  Henn M.D.   On: 04/17/2015 13:02

## 2015-04-17 NOTE — Progress Notes (Signed)
SICKLE CELL SERVICE PROGRESS NOTE  Joan Martin ZOX:[id number] DOB: 1986/08/18 DOA: 04/18/2015 PCP: MATTHEWS,MICHELLE A., MD  Assessment/Plan: Active Problems:   Dehydration   Jaundice, hemolytic   Frequent headaches   Hb-SS disease with crisis   Asthma, chronic   Hemolytic anemia   Hepatic cirrhosis   Elevated liver enzymes   1. Elevated Alkaline Phosphatase and AST: Liver enzymes continue to increase. She ha abdominal pain today which she reports is consistent with sickle cell crisis. However she has no abdominal tenderness.. Her liver enzymes are starting to trend down. Pt for liver biopsy today. She will need to follow up with GI as an out patient. 2. Anemia: Pt continues to have a decrease in HB (6.9) and Hct. Will likely need a transfusion. Check LDH fro evidence of hemolysis. Her DAT was negative  3. Hb SS with crisis: Pt has increased pain today and did not report it to the nurse as she didn't think she could get anything due to her NPO status. She is currently NPO for biopsy later today so will add some clinician assisted doses in addition to oral analgesics   4. Leukocytosis: Pt has a increased leukocytosis without evidence of infection.or fever, This could all be secondary to [facility] turnover. If she develops a fever will proceed with infectious workup. 5. Metabolic Acidosis: Pt continues to have a metabolic acidosis. SHe is on Topomax which can cause metabolic acidosis. Will decrease dose to 25 mg BID. 6. Chronic pain: Pt uses very little analgesic medication.  7. Sickle Cell Disease: Pt has been at a dose of 16 mg/kg for more than 2 years. She has been resistant to increasing dose. However she is now amenable to increasing the dose to an alternating dose of 1500 mg and 1000 mg on a every other day basis once Hb recovers. 8. 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 She is followed by Dr. Elmer [facility] and her visits are now spaced out to yearly surveillance  instead of every 6 months.  Code Status: Full Code Family Communication: Updated patient and father Disposition Plan: Not yet ready for discharge  MATTHEWS,MICHELLE A.  Pager [phone]. If 7PM-7AM, please contact night-coverage.  04/17/2015, 10:05 AM  LOS: 8 days    Consultants:  Gastroenterology  Hematology  Procedures:  None  Antibiotics:  None  HPI/Subjective: Pain worsened since yesterday. Pain localized to back, legs and abdomen.  Objective: Filed Vitals:   04/17/15 0201  BP: 122/77  Pulse: 83  Temp: 97.6 F (36.4 C)  TempSrc: Oral  Resp: 16  Height:   Weight:   SpO2: 96%   Weight change:   Intake/Output Summary (Last 24 hours) at 04/17/15 1005 Last data filed at 04/17/15 0957  Gross per 24 hour  Intake   1020 ml  Output    350 ml  Net    670 ml    General: Alert, awake, oriented x3, in no acute distress. Appears jaundiced HEENT: [place]/AT PEERL, EOMI, with increased hemolysis.. Neck: Trachea midline,  no masses, no thyromegal,y no JVD, no carotid bruit OROPHARYNX:  Moist, No exudate/ erythema/lesions.  Heart: Regular rate and rhythm, without murmurs, rubs, gallops, PMI non-displaced, no heaves or thrills on palpation.  Lungs: Clear to auscultation, no wheezing or rhonchi noted. No increased vocal fremitus resonant to percussion  Abdomen: Soft, no tenderness, nondistended, positive bowel sounds, no masses mild hepatomegaly noted.No guarding no rebound Neuro: No focal neurological deficits noted cranial nerves II through  XII grossly intact. Strength normal in bilateral upper and lower extremities. Musculoskeletal: No warm swelling or erythema around joints, no spinal tenderness noted. Psychiatric: Patient alert and oriented x3, good insight and cognition, good recent to remote recall.    Data Reviewed: Basic Metabolic Panel:  Recent Labs Lab 04/17/15 0353  NA 136  K 4.1  CL 116*  CO2 15*  GLUCOSE 79  BUN 15  CREATININE 0.32  CALCIUM 9.1    Liver Function Tests:  Recent Labs Lab 04/17/15 0353  AST 347*  ALT 101*  ALKPHOS 506*  BILITOT 27.9*  PROT 5.6 *  ALBUMIN 1.5 *   No results for input(s): LIPASE, AMYLASE in the last 168 hours. No results for input(s): AMMONIA in the last 168 hours. CBC:  Recent Labs Lab 04/17/15 0353  WBC 24.1*  NEUTROABS 12.3*  HGB 6.9*  HCT 19.7*  MCV 96.6*  PLT 152   Cardiac Enzymes: No results for input(s): CKTOTAL, CKMB, CKMBINDEX, TROPONINI in the last 168 hours. BNP (last 3 results) No results for input(s): BNP in the last 8760 hours.  ProBNP (last 3 results) No results for input(s): PROBNP in the last 8760 hours.  CBG: No results for input(s): GLUCAP in the last 168 hours.  No results found for this or any previous visit (from the past 240 hour(s)).   Studies: Dg Chest 2 View  04/04/2015   CLINICAL DATA:  Shortness of breath.  Sickle cell anemia .  EXAM: CHEST  2 VIEW  COMPARISON:  07/23/2013.  FINDINGS: Mediastinum and hilar structures are normal. Mild interstitial prominence is stable. No focal pulmonary infiltrate noted. Mild cardiomegaly and pulmonary vascular prominence. No pleural effusion or pneumothorax. Cholecystectomy.  IMPRESSION: 1. Mild cardiomegaly and pulmonary vascular prominence. 2. Mild interstitial prominence is again noted. This may be chronic. No evidence of focal pulmonary infiltrate or overt pulmonary edema. No pleural effusion or pneumothorax.   Electronically Signed   By: Maisie Fushomas  Register   On: 04/04/2015 08:34     Scheduled Meds: . sodium chloride   Intravenous Once  . sodium chloride   Intravenous Once  . enoxaparin (LOVENOX) injection  40 mg Subcutaneous Q24H  . fluticasone  1 spray Each Nare Daily  . folic acid  1 mg Oral Daily  . ibuprofen  800 mg Oral TID  . mometasone-formoterol  2 puff Inhalation BID  . senna-docusate  1 tablet Oral QHS  . topiramate  25 mg Oral Q12H  . Vitamin D (Ergocalciferol)  50,000 Units Oral Q Sun   Time  spent 50 minutes.

## 2015-04-17 NOTE — Procedures (Signed)
Central Venous Catheter Insertion Procedure Note Joan Martin Joan Martin [id number] 08/18/1986  Procedure: Insertion of Central Venous Catheter Indications: Assessment of intravascular volume  Procedure Details Consent: Risks of procedure as well as the alternatives and risks of each were explained to the (patient/caregiver).  Consent for procedure obtained. Time Out: Verified patient identification, verified procedure, site/side was marked, verified correct patient position, special equipment/implants available, medications/allergies/relevent history reviewed, required imaging and test results available.  Performed  Maximum sterile technique was used including antiseptics, cap, gloves, gown, hand hygiene, mask and sheet. Skin prep: Chlorhexidine; local anesthetic administered A antimicrobial bonded/coated triple lumen catheter was placed in the right internal jugular vein using the Seldinger technique.  Ultrasound was used to verify the patency of the vein and for real time needle guidance.  Evaluation Blood flow good Complications: No apparent complications Patient did tolerate procedure well. Chest X-ray ordered to verify placement.  CXR: pending.  Joan Martin 04/17/2015, 11:23 PM

## 2015-04-17 NOTE — Progress Notes (Addendum)
eLink Physician-Brief Progress Note Patient Name: Rogers Seedsatrice Foulks DOB: 04/13/1986 MRN: [id number]   Date of Service  04/17/2015  HPI/Events of Note  Lab review  - likely hyper hemolysis  - aicidosis - metabolic - profound and worsening - hypogleycemia - doubling in creat 0.3 to 0.6 - AKI/ATN  eICU Interventions  - d10  - fluid bolus 2- 2 u prb c stat   - bicarb  0 - kayexalate  - d/w Dr Rondel BatonMatherws of [facility] cell - feels patient is having profound heolysis. Recs : heme consult . ? HD needed     Intervention Category Intermediate Interventions: Other:  Brigett Estell 04/17/2015, 9:28 PM

## 2015-04-17 NOTE — Progress Notes (Signed)
Called to room by Gates RiggMichele Cole, RN to help assess patient. Patient was getting back to bed from the bedside commode. Patient appeared to have increased work of breathing. Vital signs at 1850 were HR 101, RR 39, BP 99/19, and oxygen 89% room air. Applied oxygen at 2L via [place] and sats recovered to 98%. Called rapid response to come and assess patient. Contacted Dr Ashley RoyaltyMatthews and got orders to transfer patient to stepdown. Orders received also for critical care to see patient and 2 units of PRBCs to be transfused. Patient's father at bedside and updated of changes in patient status and level of care.

## 2015-04-17 NOTE — Progress Notes (Signed)
Pain is decreased.  Scheduled for percutaneous liver biopsy today.

## 2015-04-17 NOTE — Procedures (Signed)
Intubation Procedure Note Rogers Seedsatrice Overbaugh [id number] 04/06/1986  Procedure: Intubation Indications: Respiratory insufficiency  Procedure Details Consent: Risks of procedure as well as the alternatives and risks of each were explained to the (patient/caregiver).  Consent for procedure obtained. Time Out: Verified patient identification, verified procedure, site/side was marked, verified correct patient position, special equipment/implants available, medications/allergies/relevent history reviewed, required imaging and test results available.  Performed  Drugs Etomidate 20mg , Rocuronium 70mg , Versed 2mg , Fentanyl 50mcg DL x 1 with GS 3 blade Grade 1 view 7.5 ET tube passed through cords under direct visualization Placement confirmed with bilateral breath sounds, positive EtCO2 change and smoke in tube   Evaluation Hemodynamic Status: BP stable throughout; O2 sats: stable throughout Patient's Current Condition: stable Complications: No apparent complications Patient did tolerate procedure well. Chest X-ray ordered to verify placement.  CXR: pending.   Max FickleMCQUAID, Sondi Desch 04/17/2015

## 2015-04-17 NOTE — Progress Notes (Signed)
I was asked to urgently evaluate this unfortunate 29 year old woman with SS disease that has been hospitalized since 04/13/2015. She presented with pain and appear to be in sickle cell crisis. She was evaluated by Dr. Mosetta PuttFeng from hematology for potential hemolytic anemia on 04/12/2015. At that time her LDH was between 600-800 with a hemoglobin drifting to as low as 6.4. Her autoimmune hemolysis workup was unrevealing at that time with Coombs testing was negative.  She has been receiving intermittent spike was cell transfusions. She was also noted to have increased and her conjugated bilirubin with her direct bilirubin is over 30 and her total bilirubin up to 40.6. She underwent a liver biopsy for presumable cirrhosis of the liver that is contributing to her elevated bilirubin and her liver function test.  This evening she was noted to be more tachypneic and her ABG showed metabolic acidosis with pH of 7.07 and bicarbonate of 7.8. Her creatinine was noted to be increasing from 0.3 up to 0.6. And her potassium is up to 6.4.  I was asked to comment about an intervention to slow down the hemolysis that is contributing to her acidosis and renal failure.  Upon my evaluation patient is lethargic, knowing responsive and about to be placed on nonrebreather and could not ascertain whether she is having any increased pain at this time. Could not appreciate diffuse having any chest pain.  Laboratory data were reviewed extensively and a peripheral smear was personally reviewed this evening.  Her peripheral smear was markedly abnormal with nucleated red cells and polychromasia. I could not appreciate any schistocytosis.  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 This appears to be complicated by lactic acidosis with her lactic acid level of  7.6.  I do not see any hematological intervention that would alleviate her hemolysis at this time given the fact that his nonimmune in nature. Do not think steroids would help.  I see no indications for exchange transfusion at this time as I do not think you will her hemolysis.   I was asked to comment about the role of hemodialysis in this setting. I do not think there is a role for plasma exchange or hemodialysis from a hematological standpoint but certainly could be from a renal standpoint. Her acidosis, hypercalcemia or urine could necessitate that.  I would recommend continue with supportive measures as you're doing with cardiovascular support, respiratory support and rigorous IV hydration and alkalinization of her urine. Renal input might be necessary sooner rather than later.  I will ask Dr. Mosetta PuttFeng to follow the patient in the morning.

## 2015-04-18 ENCOUNTER — Inpatient Hospital Stay (HOSPITAL_COMMUNITY): Payer: Medicaid Other

## 2015-04-18 ENCOUNTER — Encounter (HOSPITAL_COMMUNITY): Payer: Self-pay

## 2015-04-18 DIAGNOSIS — E162 Hypoglycemia, unspecified: Secondary | ICD-10-CM

## 2015-04-18 DIAGNOSIS — G9340 Encephalopathy, unspecified: Secondary | ICD-10-CM

## 2015-04-18 DIAGNOSIS — R51 Headache: Secondary | ICD-10-CM

## 2015-04-18 DIAGNOSIS — R58 Hemorrhage, not elsewhere classified: Secondary | ICD-10-CM | POA: Insufficient documentation

## 2015-04-18 LAB — GLUCOSE, CAPILLARY
GLUCOSE-CAPILLARY: 116 mg/dL — AB (ref 65–99)
GLUCOSE-CAPILLARY: 135 mg/dL — AB (ref 65–99)
GLUCOSE-CAPILLARY: 187 mg/dL — AB (ref 65–99)
GLUCOSE-CAPILLARY: 83 mg/dL (ref 65–99)
GLUCOSE-CAPILLARY: 85 mg/dL (ref 65–99)
Glucose-Capillary: 100 mg/dL — ABNORMAL HIGH (ref 65–99)
Glucose-Capillary: 110 mg/dL — ABNORMAL HIGH (ref 65–99)
Glucose-Capillary: 61 mg/dL — ABNORMAL LOW (ref 65–99)
Glucose-Capillary: 65 mg/dL (ref 65–99)
Glucose-Capillary: 70 mg/dL (ref 65–99)
Glucose-Capillary: 98 mg/dL (ref 65–99)
Glucose-Capillary: 99 mg/dL (ref 65–99)

## 2015-04-18 LAB — BLOOD GAS, ARTERIAL
Acid-base deficit: 17.8 mmol/L — ABNORMAL HIGH (ref 0.0–2.0)
Acid-base deficit: 11.1 mmol/L — ABNORMAL HIGH (ref 0.0–2.0)
Acid-base deficit: 14.7 mmol/L — ABNORMAL HIGH (ref 0.0–2.0)
BICARBONATE: 8.9 meq/L — AB (ref 20.0–24.0)
BICARBONATE: 11 meq/L — AB (ref 20.0–24.0)
Bicarbonate: 13.3 mEq/L — ABNORMAL LOW (ref 20.0–24.0)
Bicarbonate: 7.8 mEq/L — ABNORMAL LOW (ref 20.0–24.0)
DRAWN BY: 422461
DRAWN BY: 257701
DRAWN BY: 232811
Drawn by: 257701
FIO2: 0.3 %
FIO2: 0.3 %
FIO2: 0.7 %
FIO2: 1 %
LHR: 28 {breaths}/min
LHR: 28 {breaths}/min
LHR: 28 {breaths}/min
MECHVT: 490 mL
MECHVT: 490 mL
O2 SAT: 99 %
O2 Saturation: 99.7 %
O2 Saturation: 95.9 %
PATIENT TEMPERATURE: 34
PCO2 ART: 23.6 mmHg — AB (ref 35.0–45.0)
PEEP/CPAP: 5 cmH2O
PEEP/CPAP: 5 cmH2O
PEEP/CPAP: 5 cmH2O
PEEP: 5 cmH2O
PH ART: 7.295 — AB (ref 7.350–7.450)
PH ART: 7.127 — AB (ref 7.350–7.450)
PO2 ART: 74.6 mmHg — AB (ref 80.0–100.0)
Patient temperature: 32.5
Patient temperature: 35.2
Patient temperature: 36.3
RATE: 28 resp/min
TCO2: 9 mmol/L (ref 0–100)
TCO2: 13.2 mmol/L (ref 0–100)
TCO2: 11 mmol/L (ref 0–100)
VT: 490 mL
VT: 490 mL
pCO2 arterial: 20.1 mmHg — ABNORMAL LOW (ref 35.0–45.0)
pCO2 arterial: 22.3 mmHg — ABNORMAL LOW (ref 35.0–45.0)
pCO2 arterial: 24 mmHg — ABNORMAL LOW (ref 35.0–45.0)
pH, Arterial: 7.196 — CL (ref 7.350–7.450)
pH, Arterial: 7.41 (ref 7.350–7.450)
pO2, Arterial: 105 mmHg — ABNORMAL HIGH (ref 80.0–100.0)
pO2, Arterial: 288 mmHg — ABNORMAL HIGH (ref 80.0–100.0)
pO2, Arterial: 449 mmHg — ABNORMAL HIGH (ref 80.0–100.0)

## 2015-04-18 LAB — PROCALCITONIN: PROCALCITONIN: 2.74 ng/mL

## 2015-04-18 LAB — COMPREHENSIVE METABOLIC PANEL
ALT: 183 U/L — ABNORMAL HIGH (ref 14–54)
ANION GAP: 15 (ref 5–15)
AST: 948 U/L — ABNORMAL HIGH (ref 15–41)
Albumin: 1.2 g/dL — ABNORMAL LOW (ref 3.5–5.0)
Alkaline Phosphatase: 345 U/L — ABNORMAL HIGH (ref 38–126)
BUN: 21 mg/dL — AB (ref 6–20)
CHLORIDE: 114 mmol/L — AB (ref 101–111)
CO2: 9 mmol/L — ABNORMAL LOW (ref 22–32)
CREATININE: 1.09 mg/dL — AB (ref 0.44–1.00)
Calcium: 7.5 mg/dL — ABNORMAL LOW (ref 8.9–10.3)
Glucose, Bld: 96 mg/dL (ref 65–99)
POTASSIUM: 7.2 mmol/L — AB (ref 3.5–5.1)
Sodium: 138 mmol/L (ref 135–145)
TOTAL PROTEIN: 4.6 g/dL — AB (ref 6.5–8.1)
Total Bilirubin: 25.5 mg/dL (ref 0.3–1.2)

## 2015-04-18 LAB — URINALYSIS, ROUTINE W REFLEX MICROSCOPIC
Glucose, UA: NEGATIVE mg/dL
KETONES UR: NEGATIVE mg/dL
LEUKOCYTES UA: NEGATIVE
NITRITE: NEGATIVE
Protein, ur: NEGATIVE mg/dL
Specific Gravity, Urine: 1.005 (ref 1.005–1.030)
UROBILINOGEN UA: 0.2 mg/dL (ref 0.0–1.0)
pH: 6 (ref 5.0–8.0)

## 2015-04-18 LAB — AMMONIA: AMMONIA: 141 umol/L — AB (ref 9–35)

## 2015-04-18 LAB — CBC WITH DIFFERENTIAL/PLATELET
BAND NEUTROPHILS: 0 % (ref 0–10)
BASOS ABS: 0.6 10*3/uL — AB (ref 0.0–0.1)
BASOS ABS: 0 10*3/uL (ref 0.0–0.1)
BASOS PCT: 0 % (ref 0–1)
BLASTS: 0 %
Band Neutrophils: 4 % (ref 0–10)
Basophils Relative: 2 % — ABNORMAL HIGH (ref 0–1)
Blasts: 0 %
Eosinophils Absolute: 0.3 10*3/uL (ref 0.0–0.7)
Eosinophils Absolute: 0 10*3/uL (ref 0.0–0.7)
Eosinophils Relative: 1 % (ref 0–5)
Eosinophils Relative: 0 % (ref 0–5)
HCT: 16.7 % — ABNORMAL LOW (ref 36.0–46.0)
HCT: 22.4 % — ABNORMAL LOW (ref 36.0–46.0)
HEMOGLOBIN: 7.6 g/dL — AB (ref 12.0–15.0)
Hemoglobin: 5.5 g/dL — CL (ref 12.0–15.0)
LYMPHS ABS: 5.9 10*3/uL — AB (ref 0.7–4.0)
LYMPHS PCT: 14 % (ref 12–46)
LYMPHS PCT: 26 % (ref 12–46)
Lymphs Abs: 4.5 10*3/uL — ABNORMAL HIGH (ref 0.7–4.0)
MCH: 31.6 pg (ref 26.0–34.0)
MCH: 28.9 pg (ref 26.0–34.0)
MCHC: 32.9 g/dL (ref 30.0–36.0)
MCHC: 33.9 g/dL (ref 30.0–36.0)
MCV: 96 fL (ref 78.0–100.0)
MCV: 85.2 fL (ref 78.0–100.0)
MONOS PCT: 2 % — AB (ref 3–12)
MYELOCYTES: 0 %
Metamyelocytes Relative: 0 %
Metamyelocytes Relative: 0 %
Monocytes Absolute: 4.2 10*3/uL — ABNORMAL HIGH (ref 0.1–1.0)
Monocytes Absolute: 0.5 10*3/uL (ref 0.1–1.0)
Monocytes Relative: 13 % — ABNORMAL HIGH (ref 3–12)
Myelocytes: 0 %
NEUTROS ABS: 16.1 10*3/uL — AB (ref 1.7–7.7)
Neutro Abs: 22.7 10*3/uL — ABNORMAL HIGH (ref 1.7–7.7)
Neutrophils Relative %: 70 % (ref 43–77)
Neutrophils Relative %: 68 % (ref 43–77)
Other: 0 %
Other: 0 %
PROMYELOCYTES ABS: 0 %
Platelets: 112 10*3/uL — ABNORMAL LOW (ref 150–400)
Platelets: 167 10*3/uL (ref 150–400)
Promyelocytes Absolute: 0 %
RBC: 1.74 MIL/uL — ABNORMAL LOW (ref 3.87–5.11)
RBC: 2.63 MIL/uL — ABNORMAL LOW (ref 3.87–5.11)
RDW: 20.4 % — AB (ref 11.5–15.5)
RDW: 17.6 % — AB (ref 11.5–15.5)
WBC Morphology: INCREASED
WBC: 32.3 10*3/uL — ABNORMAL HIGH (ref 4.0–10.5)
WBC: 22.5 10*3/uL — AB (ref 4.0–10.5)
nRBC: 53 /100 WBC — ABNORMAL HIGH
nRBC: 80 /100 WBC — ABNORMAL HIGH

## 2015-04-18 LAB — BASIC METABOLIC PANEL
Anion gap: 18 — ABNORMAL HIGH (ref 5–15)
BUN: 20 mg/dL (ref 6–20)
CHLORIDE: 108 mmol/L (ref 101–111)
CO2: 15 mmol/L — ABNORMAL LOW (ref 22–32)
Calcium: 7.1 mg/dL — ABNORMAL LOW (ref 8.9–10.3)
Creatinine, Ser: 0.61 mg/dL (ref 0.44–1.00)
GFR calc Af Amer: >60 mL/min (ref >60)
GFR calc non Af Amer: >60 mL/min (ref >60)
Glucose, Bld: 102 mg/dL — ABNORMAL HIGH (ref 65–99)
Potassium: 4.7 mmol/L (ref 3.5–5.1)
Sodium: 141 mmol/L (ref 135–145)

## 2015-04-18 LAB — DIC (DISSEMINATED INTRAVASCULAR COAGULATION) PANEL
FIBRINOGEN: 175 mg/dL — AB (ref 204–475)
PLATELETS: 185 10*3/uL (ref 150–400)
PROTHROMBIN TIME: 21.4 s — AB (ref 11.6–15.2)
SMEAR REVIEW: NONE SEEN

## 2015-04-18 LAB — MAGNESIUM: MAGNESIUM: 2.2 mg/dL (ref 1.7–2.4)

## 2015-04-18 LAB — PROTIME-INR
INR: 1.9 — AB (ref 0.00–1.49)
INR: 1.88 — ABNORMAL HIGH (ref 0.00–1.49)
Prothrombin Time: 21.7 seconds — ABNORMAL HIGH (ref 11.6–15.2)
Prothrombin Time: 21.5 seconds — ABNORMAL HIGH (ref 11.6–15.2)

## 2015-04-18 LAB — FIBRINOGEN: Fibrinogen: 186 mg/dL — ABNORMAL LOW (ref 204–475)

## 2015-04-18 LAB — DIC (DISSEMINATED INTRAVASCULAR COAGULATION)PANEL
INR: 1.86 — ABNORMAL HIGH (ref 0.00–1.49)
aPTT: 68 seconds — ABNORMAL HIGH (ref 24–37)

## 2015-04-18 LAB — RETICULOCYTES
RBC.: 1.74 MIL/uL — AB (ref 3.87–5.11)
RETIC COUNT ABSOLUTE: 247.1 10*3/uL — AB (ref 19.0–186.0)
RETIC CT PCT: 14.2 % — AB (ref 0.4–3.1)

## 2015-04-18 LAB — LIPASE, BLOOD: Lipase: >3000 U/L — ABNORMAL HIGH (ref 22–51)

## 2015-04-18 LAB — URINE MICROSCOPIC-ADD ON

## 2015-04-18 LAB — LACTIC ACID, PLASMA
LACTIC ACID, VENOUS: 10.4 mmol/L — AB (ref 0.5–2.0)
Lactic Acid, Venous: 8 mmol/L (ref 0.5–2.0)

## 2015-04-18 LAB — TROPONIN I: Troponin I: 0.15 ng/mL — ABNORMAL HIGH (ref <0.031)

## 2015-04-18 LAB — APTT: APTT: 78 s — AB (ref 24–37)

## 2015-04-18 LAB — HEMOGLOBIN AND HEMATOCRIT, BLOOD
HEMATOCRIT: 17 % — AB (ref 36.0–46.0)
Hemoglobin: 5.7 g/dL — CL (ref 12.0–15.0)

## 2015-04-18 LAB — PHOSPHORUS: PHOSPHORUS: 10 mg/dL — AB (ref 2.5–4.6)

## 2015-04-18 MED ORDER — GELATIN ABSORBABLE 12-7 MM EX MISC
CUTANEOUS | Status: AC | PRN
  Administered 2015-04-18: 1

## 2015-04-18 MED ORDER — BUDESONIDE 0.25 MG/2ML IN SUSP
0.2500 mg | Freq: Two times a day (BID) | RESPIRATORY_TRACT | Status: DC
  Administered 2015-04-18 – 2015-04-21 (×7): 0.25 mg via RESPIRATORY_TRACT
  Filled 2015-04-18 (×7): qty 2

## 2015-04-18 MED ORDER — HEPARIN SOD (PORK) LOCK FLUSH 100 UNIT/ML IV SOLN
INTRAVENOUS | Status: AC
  Filled 2015-04-18: qty 5

## 2015-04-18 MED ORDER — DEXTROSE 50 % IV SOLN
1.0000 | Freq: Once | INTRAVENOUS | Status: AC
  Administered 2015-04-18: 50 mL via INTRAVENOUS

## 2015-04-18 MED ORDER — CALCIUM GLUCONATE 10 % IV SOLN
4.0000 g | Freq: Once | INTRAVENOUS | Status: DC
  Filled 2015-04-18: qty 40

## 2015-04-18 MED ORDER — MIDAZOLAM HCL 2 MG/2ML IJ SOLN
INTRAMUSCULAR | Status: AC
  Filled 2015-04-18: qty 2

## 2015-04-18 MED ORDER — PRISMASOL BGK 4/2.5 32-4-2.5 MEQ/L IV SOLN
INTRAVENOUS | Status: DC
  Administered 2015-04-18 – 2015-04-21 (×4): via INTRAVENOUS_CENTRAL
  Filled 2015-04-18 (×10): qty 5000

## 2015-04-18 MED ORDER — NITROGLYCERIN 1 MG/10 ML FOR IR/CATH LAB
INTRA_ARTERIAL | Status: AC | PRN
  Administered 2015-04-18 (×5): 100 ug via INTRA_ARTERIAL

## 2015-04-18 MED ORDER — LIDOCAINE HCL 1 % IJ SOLN
INTRAMUSCULAR | Status: AC
  Filled 2015-04-18: qty 20

## 2015-04-18 MED ORDER — INSULIN ASPART 100 UNIT/ML IV SOLN
10.0000 [IU] | Freq: Once | INTRAVENOUS | Status: AC
  Administered 2015-04-18: 10 [IU] via INTRAVENOUS

## 2015-04-18 MED ORDER — ARTIFICIAL TEARS OP OINT
TOPICAL_OINTMENT | Freq: Three times a day (TID) | OPHTHALMIC | Status: DC
  Administered 2015-04-18: 1 via OPHTHALMIC
  Administered 2015-04-18 – 2015-04-19 (×2): via OPHTHALMIC
  Filled 2015-04-18: qty 3.5

## 2015-04-18 MED ORDER — PANTOPRAZOLE SODIUM 40 MG IV SOLR
40.0000 mg | INTRAVENOUS | Status: DC
  Administered 2015-04-18 – 2015-04-20 (×3): 40 mg via INTRAVENOUS
  Filled 2015-04-18 (×3): qty 40

## 2015-04-18 MED ORDER — DEXTROSE 50 % IV SOLN
INTRAVENOUS | Status: AC
  Administered 2015-04-18: 50 mL
  Filled 2015-04-18: qty 50

## 2015-04-18 MED ORDER — IOHEXOL 300 MG/ML  SOLN
100.0000 mL | Freq: Once | INTRAMUSCULAR | Status: AC | PRN
  Administered 2015-04-18: 100 mL via INTRAVENOUS

## 2015-04-18 MED ORDER — MIDAZOLAM HCL 2 MG/2ML IJ SOLN
INTRAMUSCULAR | Status: AC | PRN
  Administered 2015-04-18: 2 mg via INTRAVENOUS

## 2015-04-18 MED ORDER — HEPARIN SODIUM (PORCINE) 1000 UNIT/ML DIALYSIS
1000.0000 [IU] | INTRAMUSCULAR | Status: DC | PRN
  Administered 2015-04-18 (×2): 1400 [IU] via INTRAVENOUS_CENTRAL
  Filled 2015-04-18 (×3): qty 6

## 2015-04-18 MED ORDER — SODIUM CHLORIDE 0.9 % IV SOLN
Freq: Once | INTRAVENOUS | Status: AC
  Administered 2015-04-18: 14:00:00 via INTRAVENOUS

## 2015-04-18 MED ORDER — PRISMASOL BGK 4/2.5 32-4-2.5 MEQ/L IV SOLN
INTRAVENOUS | Status: DC
  Administered 2015-04-18 – 2015-04-21 (×22): via INTRAVENOUS_CENTRAL
  Filled 2015-04-18 (×34): qty 5000

## 2015-04-18 MED ORDER — SODIUM BICARBONATE 8.4 % IV SOLN
100.0000 meq | Freq: Once | INTRAVENOUS | Status: AC
  Administered 2015-04-18: 100 meq via INTRAVENOUS

## 2015-04-18 MED ORDER — PRISMASOL BGK 4/2.5 32-4-2.5 MEQ/L IV SOLN
INTRAVENOUS | Status: DC
  Administered 2015-04-18 – 2015-04-21 (×6): via INTRAVENOUS_CENTRAL
  Filled 2015-04-18 (×9): qty 5000

## 2015-04-18 MED ORDER — VERAPAMIL HCL 2.5 MG/ML IV SOLN
INTRAVENOUS | Status: AC | PRN
  Administered 2015-04-18: 2.5 mg via INTRA_ARTERIAL

## 2015-04-18 MED ORDER — SODIUM BICARBONATE 8.4 % IV SOLN
100.0000 meq | Freq: Once | INTRAVENOUS | Status: AC
  Administered 2015-04-18: 100 meq via INTRAVENOUS
  Filled 2015-04-18: qty 100

## 2015-04-18 MED ORDER — SODIUM CHLORIDE 0.9 % IV SOLN
1.0000 g | Freq: Once | INTRAVENOUS | Status: AC
  Administered 2015-04-18: 1 g via INTRAVENOUS
  Filled 2015-04-18: qty 10

## 2015-04-18 MED ORDER — ACD FORMULA A 0.73-2.45-2.2 GM/100ML VI SOLN
500.0000 mL | Status: DC
  Filled 2015-04-18 (×2): qty 500

## 2015-04-18 MED ORDER — IOHEXOL 300 MG/ML  SOLN
45.0000 mL | Freq: Once | INTRAMUSCULAR | Status: AC | PRN
  Administered 2015-04-18: 45 mL via INTRA_ARTERIAL

## 2015-04-18 MED ORDER — IOHEXOL 300 MG/ML  SOLN
20.0000 mL | Freq: Once | INTRAMUSCULAR | Status: AC | PRN
  Administered 2015-04-18: 15 mL

## 2015-04-18 MED ORDER — SODIUM CHLORIDE 0.9 % IV SOLN: Freq: Once | INTRAVENOUS | Status: DC

## 2015-04-18 MED ORDER — AZTREONAM 2 G IJ SOLR
2.0000 g | Freq: Two times a day (BID) | INTRAMUSCULAR | Status: DC
  Administered 2015-04-18 – 2015-04-19 (×2): 2 g via INTRAVENOUS
  Filled 2015-04-18 (×3): qty 2

## 2015-04-18 MED ORDER — "THROMBI-PAD 3""X3"" EX PADS"
1.0000 | MEDICATED_PAD | Freq: Once | CUTANEOUS | Status: AC
  Administered 2015-04-18: 1 via TOPICAL
  Filled 2015-04-18: qty 1

## 2015-04-18 MED ORDER — SODIUM CHLORIDE 0.9 % FOR CRRT
INTRAVENOUS_CENTRAL | Status: DC | PRN
  Filled 2015-04-18: qty 1000

## 2015-04-18 MED ORDER — ACETAMINOPHEN 325 MG PO TABS: 650.0000 mg | ORAL_TABLET | ORAL | Status: DC | PRN

## 2015-04-18 MED ORDER — IOHEXOL 300 MG/ML  SOLN
100.0000 mL | Freq: Once | INTRAMUSCULAR | Status: AC | PRN
  Administered 2015-04-18: 50 mL

## 2015-04-18 MED ORDER — FOLIC ACID 5 MG/ML IJ SOLN
1.0000 mg | Freq: Every day | INTRAMUSCULAR | Status: DC
  Administered 2015-04-18 – 2015-04-20 (×3): 1 mg via INTRAVENOUS
  Filled 2015-04-18 (×4): qty 0.2

## 2015-04-18 MED ORDER — VANCOMYCIN HCL IN DEXTROSE 750-5 MG/150ML-% IV SOLN
750.0000 mg | Freq: Two times a day (BID) | INTRAVENOUS | Status: DC
  Filled 2015-04-18: qty 150

## 2015-04-18 MED ORDER — HEPARIN SODIUM (PORCINE) 1000 UNIT/ML IJ SOLN: 1000.0000 [IU] | Freq: Once | INTRAMUSCULAR | Status: DC

## 2015-04-18 MED ORDER — SODIUM CHLORIDE 0.9 % IV SOLN
Freq: Once | INTRAVENOUS | Status: AC
  Administered 2015-04-18: 15:00:00 via INTRAVENOUS

## 2015-04-18 MED ORDER — VERAPAMIL HCL 2.5 MG/ML IV SOLN
INTRAVENOUS | Status: AC
Start: 2015-04-18 — End: 2015-04-18
  Filled 2015-04-18: qty 2

## 2015-04-18 MED ORDER — SODIUM BICARBONATE 8.4 % IV SOLN
INTRAVENOUS | Status: AC
  Filled 2015-04-18: qty 100

## 2015-04-18 MED ORDER — ALBUTEROL SULFATE (2.5 MG/3ML) 0.083% IN NEBU
2.5000 mg | INHALATION_SOLUTION | RESPIRATORY_TRACT | Status: DC | PRN
Start: 2015-04-18 — End: 2015-04-21

## 2015-04-18 MED ORDER — DEXTROSE 5 % IV SOLN
5.0000 mg | Freq: Once | INTRAVENOUS | Status: AC
  Administered 2015-04-18: 5 mg via INTRAVENOUS
  Filled 2015-04-18: qty 0.5

## 2015-04-18 MED ORDER — DEXTROSE 50 % IV SOLN
INTRAVENOUS | Status: AC
  Filled 2015-04-18: qty 50

## 2015-04-18 MED ORDER — VANCOMYCIN HCL IN DEXTROSE 1-5 GM/200ML-% IV SOLN
1000.0000 mg | INTRAVENOUS | Status: DC
  Administered 2015-04-18: 750 mg via INTRAVENOUS

## 2015-04-18 MED ORDER — LIDOCAINE-EPINEPHRINE 2 %-1:100000 IJ SOLN
INTRAMUSCULAR | Status: AC
  Filled 2015-04-18: qty 1

## 2015-04-18 MED ORDER — IOHEXOL 300 MG/ML  SOLN
75.0000 mL | Freq: Once | INTRAMUSCULAR | Status: AC | PRN
  Administered 2015-04-18: 75 mL via INTRA_ARTERIAL

## 2015-04-18 MED ORDER — ARFORMOTEROL TARTRATE 15 MCG/2ML IN NEBU
15.0000 ug | INHALATION_SOLUTION | Freq: Two times a day (BID) | RESPIRATORY_TRACT | Status: DC
  Administered 2015-04-18 – 2015-04-21 (×7): 15 ug via RESPIRATORY_TRACT
  Filled 2015-04-18 (×7): qty 2

## 2015-04-18 MED ORDER — LACTULOSE 10 GM/15ML PO SOLN
30.0000 g | Freq: Three times a day (TID) | ORAL | Status: DC
  Administered 2015-04-18 – 2015-04-19 (×7): 30 g
  Filled 2015-04-18 (×7): qty 45

## 2015-04-18 NOTE — Progress Notes (Addendum)
Ms.   Pola CorneLink Physician-Brief Progress Note Patient Name: Joan Martin DOB: 05/24/1986 MRN: [id number]  Date of Service  04/18/2015   HPI/Events of Note  worsning lactic acidosis 10 shockliver Now on pressors  cbvc and coag ok  Calcium low  RN says till some mild oozzing at different sites   eICU Interventions  Very popor prognosis Doubt will survive Replete calcium Check lipase and ck Monitor closely Continue levophed  She is on max support   Intervention Category Major Interventions: Electrolyte abnormality - evaluation and management Intermediate Interventions: Other:  Dontavion Noxon 04/18/2015, 10:14 PM

## 2015-04-18 NOTE — Sedation Documentation (Signed)
No sedation required for procedure.

## 2015-04-18 NOTE — Procedures (Signed)
Post particle embolization of the medial segmental branch of the left hepatic artery for active extravastion.   Keep right leg straight until right CFA vascular sheath removed. Successful placement of a temporary HD catheter via the left CFV tips terminating within caudal aspect of the IVC The catheter is ready for immediate use.  The patient tolerated the above procedures well without immediate post procedural complication.

## 2015-04-18 NOTE — Progress Notes (Signed)
Date:  April 18, 2015 U.R. performed for needs and level of care. Full support vent care, Beaur Hugger, vasopressors. Will continue to follow for Case Management needs.  Marcelle Smilinghonda Davis, RN, BSN, [place]   [phone]

## 2015-04-18 NOTE — Progress Notes (Signed)
Events noted.  Patient remains intubated.  Vital signs are stable. Abdomen is grossly distended and tense.  She has been successfully embolized.  It would appear that she is no longer actively bleeding into the peritoneum on the basis of radiologic documentation of successful embolization.  It is difficult to judge from her abdominal exam since she has tense ascites which is from blood.  Recommend continue broad-spectrum antibody coverage, hemologic support per hematology.  I am not too concerned about elevated ammonia level at present.  May consider large volume paracentesis if it is felt that tense ascites is interfering with her respiratory function.

## 2015-04-18 NOTE — Progress Notes (Signed)
ANTIBIOTIC CONSULT NOTE - FOLLOW UP  Pharmacy Consult for vancomycin, aztreonam Indication: HCAP  Allergies  Allergen Reactions  . Cephalosporins     Possible family reaction  . Citrus Other (See Comments)    Severity varies/ Reaction varies hives around mouth to swelling around mouth and lips.   . Morphine And Related Hives    Patient states she can take IV dilaudid with benadryl    Patient Measurements: Height: 5\' 7"  (170.2 cm) Weight: 148 lb 1.6 oz (67.178 kg) IBW/kg (Calculated) : 61.6 Adjusted Body Weight:   Vital Signs: Temp: 97 F (36.1 C) (06/29 0630) Temp Source: Core (Comment) (06/29 0215) BP: 109/62 mmHg (06/29 0630) Pulse Rate: 101 (06/29 0630) Intake/Output from previous day: 06/28 0701 - 06/29 0700 In: 2396 [P.O.:240; I.V.:1330; Blood:706] Out: 1140 [Urine:1140] Intake/Output from this shift: Total I/O In: 2156 [I.V.:1330; Blood:706; Other:120] Out: -   Labs:  Recent Labs  04/17/15 0353 04/17/15 2037 04/18/15 0222  WBC 24.1* 30.5* 32.3*  HGB 6.9* 4.7* 5.5*  PLT 152 89* 112*  CREATININE 0.32* 0.62 1.09*   Estimated Creatinine Clearance: 73.4 mL/min (by C-G formula based on Cr of 1.09). No results for input(s): VANCOTROUGH, VANCOPEAK, VANCORANDOM, GENTTROUGH, GENTPEAK, GENTRANDOM, TOBRATROUGH, TOBRAPEAK, TOBRARND, AMIKACINPEAK, AMIKACINTROU, AMIKACIN in the last 72 hours.   Microbiology: Recent Results (from the past 720 hour(s))  Culture, blood (single)     Status: None   Collection Time: 04/03/15  3:26 PM  Result Value Ref Range Status   Organism ID, Bacteria NO GROWTH 5 DAYS  Final  MRSA PCR Screening     Status: None   Collection Time: 04/17/15  8:36 PM  Result Value Ref Range Status   MRSA by PCR NEGATIVE NEGATIVE Final    Comment:        The GeneXpert MRSA Assay (FDA approved for NASAL specimens only), is one component of a comprehensive MRSA colonization surveillance program. It is not intended to diagnose MRSA infection nor to  guide or monitor treatment for MRSA infections.     Anti-infectives    Start     Dose/Rate Route Frequency Ordered Stop   04/18/15 1200  aztreonam (AZACTAM) 2 g in dextrose 5 % 50 mL IVPB     2 g 100 mL/hr over 30 Minutes Intravenous Every 12 hours 04/18/15 0659     04/18/15 0800  vancomycin (VANCOCIN) IVPB 1000 mg/200 mL premix     1,000 mg 200 mL/hr over 60 Minutes Intravenous Every 24 hours 04/18/15 0658     04/18/15 0600  vancomycin (VANCOCIN) IVPB 750 mg/150 ml premix  Status:  Discontinued     750 mg 150 mL/hr over 60 Minutes Intravenous Every 12 hours 04/18/15 0558 04/18/15 0655   04/17/15 2315  aztreonam (AZACTAM) 2 g in dextrose 5 % 50 mL IVPB  Status:  Discontinued     2 g 100 mL/hr over 30 Minutes Intravenous 3 times per day 04/17/15 2313 04/18/15 0658   04/17/15 2315  vancomycin (VANCOCIN) IVPB 1000 mg/200 mL premix  Status:  Discontinued     1,000 mg 200 mL/hr over 60 Minutes Intravenous 3 times per day 04/17/15 2313 04/18/15 0557      Assessment: Patient starting CRRT.    Goal of Therapy:  Vancomycin trough level 15-20 mcg/ml  Aztreonam dosed based on patient weight and renal function   Plan:  Follow up culture results  Change vancomycin to 1gm iv q24hr, aztreonam to 2gm iv q12hr  Darlina GuysGrimsley Jr, Jacquenette ShoneJulian Crowford 04/18/2015,6:59 AM

## 2015-04-18 NOTE — Consult Note (Signed)
Reason for Consult:  Ongoing bleeding from liver biopsy site Referring Physician: Dr. Daiva Huge is an 29 y.o. female.  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  Post procedure she had bleeding, acute respiratory failure with intubation on 0/73, acute metabolic acidosis, encephalopathy and potassium of 7.2. INR early this AM 2.09 and FFP/Vit K ordered.  Pt underwent embolization by IR early this AM with: particle embolization of the medial segmental branch of the left hepatic artery for active extravasation, along with HD catheter placement.  She now has acute kidney injury along with these issues.  She is being followed by hematology also for her coagulation issues.  Currently CCM is working to correct her coagulation issues and she is going back to IR for further study and treatment.  She has had 2 units PRBC last PM 2 ordered for today, and 2 units of FFP so far.  More is ordered.  We are ask to see.  Past Medical History  Diagnosis Date  . Sickle cell anemia   . Stroke     7 years ago  . 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 Asthma     Past Surgical History  Procedure Laterality Date  . Cholecystectomy      Family History  Problem Relation Age of Onset  . Diabetes Mother   . Hypertension Mother   . Diabetes Father   . Hypertension Father   . Cancer Maternal Aunt   . Sickle cell anemia Sister     Social History:  reports that she has never smoked. She has never used smokeless tobacco. She reports that she does not drink alcohol or use illicit drugs.  Allergies:  Allergies  Allergen Reactions  . Cephalosporins     Possible family reaction  . Citrus Other (See Comments)    Severity varies/ Reaction varies hives around mouth to swelling around mouth and lips.   .  Morphine And Related Hives    Patient states she can take IV dilaudid with benadryl    Medications:  Prior to Admission:  Prescriptions prior to admission  Medication Sig Dispense Refill Last Dose  . albuterol (PROVENTIL) (2.5 MG/3ML) 0.083% nebulizer solution Take 3 mLs (2.5 mg total) by nebulization every 6 (six) hours as needed for shortness of breath. 75 mL 12 Past Week at Unknown time  . diphenhydrAMINE (BENADRYL) 25 MG tablet Take 25 mg by mouth every 6 (six) hours as needed for itching.   04/08/2015 at Unknown time  . Fluticasone-Salmeterol (ADVAIR) 250-50 MCG/DOSE AEPB Inhale 1 puff into the lungs daily.   03/29/2015 at Unknown time  . folic acid (FOLVITE) 1 MG tablet Take 1 tablet (1 mg total) by mouth daily. 30 tablet 11 03/28/2015 at Unknown time  . hydroxyurea (HYDREA) 500 MG capsule TAKE ONE CAPSULE BY MOUTH TWICE DAILY 180 capsule 0 03/26/2015 at Unknown time  . ibuprofen (ADVIL,MOTRIN) 600 MG tablet Take 1 tablet (600 mg total) by mouth every 8 (eight) hours as needed for pain. 30 tablet 0 04/08/2015 at Unknown time  . mometasone (NASONEX) 50 MCG/ACT nasal spray Place 2 sprays into the nose daily. (Patient taking differently: Place 2 sprays into the nose at bedtime. ) 17 g 0 04/08/2015 at Unknown time  . ondansetron (ZOFRAN) 4  MG tablet Take 1 tablet (4 mg total) by mouth every 6 (six) hours. (Patient taking differently: Take 4 mg by mouth every 6 (six) hours as needed for nausea or vomiting. ) 20 tablet 0 04/08/2015 at Unknown time  . oxyCODONE-acetaminophen (PERCOCET) 10-325 MG per tablet Take 1 tablet every 4 hours for severe pain (Patient taking differently: Take 1 tablet by mouth every 4 (four) hours as needed for pain. ) 60 tablet 0 04/08/2015 at Unknown time  . promethazine (PHENERGAN) 25 MG tablet Take 25 mg by mouth every 6 (six) hours as needed for nausea.   unknown  . topiramate (TOPAMAX) 50 MG tablet Take 0.5tab BID x7d, then 1tab BID. (Patient taking differently: Take 50 mg by  mouth 2 (two) times daily. ) 60 tablet 0 03/24/2015 at Unknown time  . SUMAtriptan (IMITREX) 25 MG tablet Take 1 tablet (25 mg total) by mouth once. May repeat in 2 hours if headache persists or recurs. Not to exceed 2 doses in 24 hours 10 tablet 0 Unknown at Unknown time   Scheduled: . sodium chloride   Intravenous Once  . sodium chloride   Intravenous Once  . sodium chloride   Intravenous Once  . antiseptic oral rinse  7 mL Mouth Rinse QID  . arformoterol  15 mcg Nebulization BID  . artificial tears   Both Eyes 3 times per day  . aztreonam  2 g Intravenous Q12H  . budesonide (PULMICORT) nebulizer solution  0.25 mg Nebulization BID  . calcium gluconate IVPB  4 g Intravenous Once  . chlorhexidine  15 mL Mouth Rinse BID  . fentaNYL (SUBLIMAZE) injection  100 mcg Intravenous Once  . folic acid  1 mg Intravenous Daily  . heparin  1,000 Units Intracatheter Once  . heparin lock flush      . lactulose  30 g Per Tube TID  . lidocaine      . lidocaine-EPINEPHrine      . pantoprazole (PROTONIX) IV  40 mg Intravenous Q24H  . sodium bicarbonate      . verapamil      . Vitamin D (Ergocalciferol)  50,000 Units Oral Q Sun   Continuous: . citrate dextrose    . dextrose 30 mL/hr (04/18/15 0807)  . fentaNYL infusion INTRAVENOUS 250 mcg/hr (04/18/15 1211)  . norepinephrine (LEVOPHED) Adult infusion    . dialysis replacement fluid (prismasate) 1,000 mL/hr at 04/18/15 0855  . dialysis replacement fluid (prismasate) 1,000 mL/hr at 04/18/15 0855  . dialysate (PRISMASATE) 2,000 mL/hr at 04/18/15 1114   EPP:IRJJOACZY, diphenhydrAMINE, fentaNYL, heparin, midazolam, promethazine, sodium chloride Anti-infectives    Start     Dose/Rate Route Frequency Ordered Stop   04/18/15 1200  aztreonam (AZACTAM) 2 g in dextrose 5 % 50 mL IVPB     2 g 100 mL/hr over 30 Minutes Intravenous Every 12 hours 04/18/15 0659     04/18/15 0800  vancomycin (VANCOCIN) IVPB 1000 mg/200 mL premix  Status:  Discontinued     1,000  mg 200 mL/hr over 60 Minutes Intravenous Every 24 hours 04/18/15 0658 04/18/15 0914   04/18/15 0600  vancomycin (VANCOCIN) IVPB 750 mg/150 ml premix  Status:  Discontinued     750 mg 150 mL/hr over 60 Minutes Intravenous Every 12 hours 04/18/15 0558 04/18/15 0655   04/17/15 2315  aztreonam (AZACTAM) 2 g in dextrose 5 % 50 mL IVPB  Status:  Discontinued     2 g 100 mL/hr over 30 Minutes Intravenous 3 times per day 04/17/15  2313 04/18/15 0658   04/17/15 2315  vancomycin (VANCOCIN) IVPB 1000 mg/200 mL premix  Status:  Discontinued     1,000 mg 200 mL/hr over 60 Minutes Intravenous 3 times per day 04/17/15 2313 04/18/15 0557      Results for orders placed or performed during the hospital encounter of 04/13/2015 (from the past 48 hour(s))  CBC with Differential/Platelet     Status: Abnormal   Collection Time: 04/17/15  3:53 AM  Result Value Ref Range   WBC 24.1 (H) 4.0 - 10.5 K/uL    Comment: WHITE COUNT CONFIRMED ON SMEAR ADJUSTED FOR NUCLEATED RBC'S    RBC 2.04 (L) 3.87 - 5.11 MIL/uL   Hemoglobin 6.9 (LL) 12.0 - 15.0 g/dL    Comment: REPEATED TO VERIFY CRITICAL RESULT CALLED TO, READ BACK BY AND VERIFIED WITH: J. MAMFELT RN AT 0520 ON 06.28.16 BY SHUEA    HCT 19.7 (L) 36.0 - 46.0 %   MCV 96.6 78.0 - 100.0 fL   MCH 33.8 26.0 - 34.0 pg   MCHC 35.0 30.0 - 36.0 g/dL   RDW 25.3 (H) 11.5 - 15.5 %   Platelets 152 150 - 400 K/uL    Comment: LARGE PLATELETS PRESENT PLATELET COUNT CONFIRMED BY SMEAR    Neutrophils Relative % 42 (L) 43 - 77 %   Lymphocytes Relative 41 12 - 46 %   Monocytes Relative 8 3 - 12 %   Eosinophils Relative 0 0 - 5 %   Basophils Relative 0 0 - 1 %   Band Neutrophils 0 0 - 10 %   Metamyelocytes Relative 2 %   Myelocytes 7 %   Promyelocytes Absolute 0 %   Blasts 0 %   nRBC 64 (H) 0 /100 WBC   Other 0 %   Neutro Abs 12.3 (H) 1.7 - 7.7 K/uL   Lymphs Abs 9.9 (H) 0.7 - 4.0 K/uL   Monocytes Absolute 1.9 (H) 0.1 - 1.0 K/uL   Eosinophils Absolute 0.0 0.0 - 0.7 K/uL    Basophils Absolute 0.0 0.0 - 0.1 K/uL   RBC Morphology POLYCHROMASIA PRESENT     Comment: TARGET CELLS SICKLE CELLS    WBC Morphology MILD LEFT SHIFT (1-5% METAS, OCC MYELO, OCC BANDS)   Comprehensive metabolic panel     Status: Abnormal   Collection Time: 04/17/15  3:53 AM  Result Value Ref Range   Sodium 136 135 - 145 mmol/L   Potassium 4.1 3.5 - 5.1 mmol/L   Chloride 116 (H) 101 - 111 mmol/L   CO2 15 (L) 22 - 32 mmol/L   Glucose, Bld 79 65 - 99 mg/dL   BUN 15 6 - 20 mg/dL   Creatinine, Ser 0.32 (L) 0.44 - 1.00 mg/dL   Calcium 9.1 8.9 - 10.3 mg/dL   Total Protein 5.6 (L) 6.5 - 8.1 g/dL   Albumin 1.5 (L) 3.5 - 5.0 g/dL   AST 347 (H) 15 - 41 U/L   ALT 101 (H) 14 - 54 U/L   Alkaline Phosphatase 506 (H) 38 - 126 U/L   Total Bilirubin 27.9 (HH) 0.3 - 1.2 mg/dL    Comment: RESULTS CONFIRMED BY MANUAL DILUTION DELTA CHECK NOTED CRITICAL RESULT CALLED TO, READ BACK BY AND VERIFIED WITH: J MALMSIT RN 0932 04/17/15 A NAVARRO    GFR calc non Af Amer >60 >60 mL/min   GFR calc Af Amer >60 >60 mL/min    Comment: (NOTE) The eGFR has been calculated using the CKD EPI equation. This calculation  has not been validated in all clinical situations. eGFR's persistently <60 mL/min signify possible Chronic Kidney Disease.    Anion gap 5 5 - 15  Lactate dehydrogenase     Status: Abnormal   Collection Time: 04/17/15  3:53 AM  Result Value Ref Range   LDH 1322 (H) 98 - 192 U/L  Prepare RBC     Status: None   Collection Time: 04/17/15  7:16 PM  Result Value Ref Range   Order Confirmation ORDER PROCESSED BY BLOOD BANK   MRSA PCR Screening     Status: None   Collection Time: 04/17/15  8:36 PM  Result Value Ref Range   MRSA by PCR NEGATIVE NEGATIVE    Comment:        The GeneXpert MRSA Assay (FDA approved for NASAL specimens only), is one component of a comprehensive MRSA colonization surveillance program. It is not intended to diagnose MRSA infection nor to guide or monitor treatment  for MRSA infections.   CBC     Status: Abnormal   Collection Time: 04/17/15  8:37 PM  Result Value Ref Range   WBC 30.5 (H) 4.0 - 10.5 K/uL    Comment: WHITE COUNT CONFIRMED ON SMEAR ADJUSTED FOR NUCLEATED RBC'S    RBC 1.41 (L) 3.87 - 5.11 MIL/uL   Hemoglobin 4.7 (LL) 12.0 - 15.0 g/dL    Comment: DELTA CHECK NOTED REPEATED TO VERIFY CRITICAL VALUE NOTED.  VALUE IS CONSISTENT WITH PREVIOUSLY REPORTED AND CALLED VALUE.    HCT 13.8 (L) 36.0 - 46.0 %   MCV 97.9 78.0 - 100.0 fL   MCH 33.3 26.0 - 34.0 pg   MCHC 34.1 30.0 - 36.0 g/dL   RDW 25.1 (H) 11.5 - 15.5 %   Platelets 89 (L) 150 - 400 K/uL    Comment: REPEATED TO VERIFY SPECIMEN CHECKED FOR CLOTS PLATELET COUNT CONFIRMED BY SMEAR DELTA CHECK NOTED   Basic metabolic panel     Status: Abnormal   Collection Time: 04/17/15  8:37 PM  Result Value Ref Range   Sodium 138 135 - 145 mmol/L   Potassium 6.4 (HH) 3.5 - 5.1 mmol/L    Comment: DELTA CHECK NOTED REPEATED TO VERIFY CRITICAL RESULT CALLED TO, READ BACK BY AND VERIFIED WITHSharia Reeve RN 2108 04/17/15 A NAVARRO    Chloride 117 (H) 101 - 111 mmol/L   CO2 9 (L) 22 - 32 mmol/L   Glucose, Bld 20 (LL) 65 - 99 mg/dL    Comment: REPEATED TO VERIFY CRITICAL RESULT CALLED TO, READ BACK BY AND VERIFIED WITH: Sharia Reeve RN 2108 04/17/15 A NAVARRO    BUN 19 6 - 20 mg/dL   Creatinine, Ser 0.62 0.44 - 1.00 mg/dL   Calcium 9.2 8.9 - 10.3 mg/dL   GFR calc non Af Amer >60 >60 mL/min   GFR calc Af Amer >60 >60 mL/min    Comment: (NOTE) The eGFR has been calculated using the CKD EPI equation. This calculation has not been validated in all clinical situations. eGFR's persistently <60 mL/min signify possible Chronic Kidney Disease.    Anion gap 12 5 - 15  Blood gas, arterial     Status: Abnormal   Collection Time: 04/17/15  8:51 PM  Result Value Ref Range   O2 Content 2.0 L/min   Delivery systems NASAL CANNULA    pH, Arterial 7.071 (LL) 7.350 - 7.450    Comment: CRITICAL RESULT  CALLED TO, READ BACK BY AND VERIFIED WITH: Sharia Reeve, RN AT 2101 ON  04/17/15 BY D. JONES, RRT, RCP    pCO2 arterial 27.2 (L) 35.0 - 45.0 mmHg   pO2, Arterial 33.5 (LL) 80.0 - 100.0 mmHg    Comment: CRITICAL RESULT CALLED TO, READ BACK BY AND VERIFIED WITH: Sharia Reeve, RN AY 2101 ON 04/17/15 BY D. JONES, RRT, RCP    Bicarbonate 7.8 (L) 20.0 - 24.0 mEq/L   O2 Saturation 35.5 %   Patient temperature 95.2    Collection site RIGHT RADIAL    Drawn by [id number]    Sample type ARTERIAL DRAW    Allens test (pass/fail) PASS PASS  Lactic acid, plasma     Status: Abnormal   Collection Time: 04/17/15  9:43 PM  Result Value Ref Range   Lactic Acid, Venous 7.6 (HH) 0.5 - 2.0 mmol/L    Comment: REPEATED TO VERIFY CRITICAL RESULT CALLED TO, READ BACK BY AND VERIFIED WITH: C.OAKLEY RN AT 2232 ON 6.28.16 BY W.BUCHHOLZ.   Type and screen     Status: None (Preliminary result)   Collection Time: 04/17/15  9:43 PM  Result Value Ref Range   ABO/RH(D) O POS    Antibody Screen NEG    Sample Expiration 04/20/2015    Unit Number [id number]    Blood Component Type RED CELLS,LR    Unit division 00    Status of Unit ISSUED,FINAL    Transfusion Status OK TO TRANSFUSE    Crossmatch Result Compatible    Donor AG Type      NEGATIVE FOR C ANTIGEN NEGATIVE FOR E ANTIGEN NEGATIVE FOR KELL ANTIGEN   Unit Number [id number]    Blood Component Type RED CELLS,LR    Unit division 00    Status of Unit ISSUED    Transfusion Status OK TO TRANSFUSE    Crossmatch Result Compatible    Donor AG Type      NEGATIVE FOR C ANTIGEN NEGATIVE FOR E ANTIGEN NEGATIVE FOR KELL ANTIGEN  Reticulocytes     Status: Abnormal   Collection Time: 04/17/15 10:36 PM  Result Value Ref Range   Retic Ct Pct 19.5 (H) 0.4 - 3.1 %   RBC. 1.42 (L) 3.87 - 5.11 MIL/uL   Retic Count, Manual 276.9 (H) 19.0 - 186.0 K/uL  Culture, blood (routine x 2)     Status: None (Preliminary result)   Collection Time: 04/17/15 10:37 PM  Result Value Ref  Range   Specimen Description BLOOD BLOOD LEFT FOREARM    Special Requests      BOTTLES DRAWN AEROBIC ONLY [place] Performed at [facility]    Culture PENDING    Report Status PENDING   Protime-INR     Status: Abnormal   Collection Time: 04/17/15 10:44 PM  Result Value Ref Range   Prothrombin Time 23.3 (H) 11.6 - 15.2 seconds   INR 2.09 (H) 0.00 - 1.49  Cortisol     Status: None   Collection Time: 04/17/15 10:44 PM  Result Value Ref Range   Cortisol, Plasma 26.1 ug/dL    Comment: (NOTE) AM    6.7 - 22.6 ug/dL PM   <10.0       ug/dL Performed at [facility]   Ammonia     Status: Abnormal   Collection Time: 04/17/15 11:47 PM  Result Value Ref Range   Ammonia 141 (H) 9 - 35 umol/L  Glucose, capillary     Status: Abnormal   Collection Time: 04/17/15 11:47 PM  Result Value Ref Range   Glucose-Capillary 61 (L) 65 -  99 mg/dL   Comment 1 Notify RN    Comment 2 Document in Chart   Draw ABG 1 hour after initiation of ventilator     Status: Abnormal   Collection Time: 04/17/15 11:55 PM  Result Value Ref Range   FIO2 1.00 %   Delivery systems VENTILATOR    Mode PRESSURE REGULATED VOLUME CONTROL    VT 490 mL   Rate 28 resp/min   Peep/cpap 5.0 cm H20   pH, Arterial 7.127 (LL) 7.350 - 7.450    Comment: CRITICAL RESULT CALLED TO, READ BACK BY AND VERIFIED WITH: Leandra Kern, RN AT 0003 BY ANNALISSA AGUSTIN,RRT,RCP ON 04/18/15    pCO2 arterial 24.0 (L) 35.0 - 45.0 mmHg   pO2, Arterial 449 (H) 80.0 - 100.0 mmHg   Bicarbonate 7.8 (L) 20.0 - 24.0 mEq/L   O2 Saturation VALUE ABOVE REPORTABLE RANGE %    Comment: CRITICAL RESULT CALLED TO, READ BACK BY AND VERIFIED WITH: Leandra Kern, RN AT 0003 BY ANNALISSA AGUSTIN,RRT,RCP ON 04/18/15    Patient temperature 35.2    Collection site RIGHT RADIAL    Drawn by [id number]    Sample type ARTERIAL    Allens test (pass/fail) PASS PASS  Glucose, capillary     Status: Abnormal   Collection Time: 04/18/15 12:03 AM  Result Value Ref  Range   Glucose-Capillary 135 (H) 65 - 99 mg/dL   Comment 1 Notify RN    Comment 2 Document in Chart   Glucose, capillary     Status: None   Collection Time: 04/18/15  1:39 AM  Result Value Ref Range   Glucose-Capillary 83 65 - 99 mg/dL  Comprehensive metabolic panel     Status: Abnormal   Collection Time: 04/18/15  2:22 AM  Result Value Ref Range   Sodium 138 135 - 145 mmol/L   Potassium 7.2 (HH) 3.5 - 5.1 mmol/L    Comment: REPEATED TO VERIFY CRITICAL RESULT CALLED TO, READ BACK BY AND VERIFIED WITH: C CREECH RN AT 0402 ON 06.29.16 BY G KONTOS    Chloride 114 (H) 101 - 111 mmol/L   CO2 9 (L) 22 - 32 mmol/L   Glucose, Bld 96 65 - 99 mg/dL   BUN 21 (H) 6 - 20 mg/dL   Creatinine, Ser 1.09 (H) 0.44 - 1.00 mg/dL   Calcium 7.5 (L) 8.9 - 10.3 mg/dL   Total Protein 4.6 (L) 6.5 - 8.1 g/dL   Albumin 1.2 (L) 3.5 - 5.0 g/dL   AST 948 (H) 15 - 41 U/L   ALT 183 (H) 14 - 54 U/L   Alkaline Phosphatase 345 (H) 38 - 126 U/L   Total Bilirubin 25.5 (HH) 0.3 - 1.2 mg/dL    Comment: REPEATED TO VERIFY CRITICAL RESULT CALLED TO, READ BACK BY AND VERIFIED WITH: C CREECH RN AT 0402 ON 06.29.16 BY G KONTOS    GFR calc non Af Amer >60 >60 mL/min   GFR calc Af Amer >60 >60 mL/min    Comment: (NOTE) The eGFR has been calculated using the CKD EPI equation. This calculation has not been validated in all clinical situations. eGFR's persistently <60 mL/min signify possible Chronic Kidney Disease.    Anion gap 15 5 - 15  CBC with Differential/Platelet     Status: Abnormal   Collection Time: 04/18/15  2:22 AM  Result Value Ref Range   WBC 32.3 (H) 4.0 - 10.5 K/uL    Comment: WHITE COUNT CONFIRMED ON SMEAR ADJUSTED FOR NUCLEATED RBC'S  RBC 1.74 (L) 3.87 - 5.11 MIL/uL   Hemoglobin 5.5 (LL) 12.0 - 15.0 g/dL    Comment: REPEATED TO VERIFY CRITICAL VALUE NOTED.  VALUE IS CONSISTENT WITH PREVIOUSLY REPORTED AND CALLED VALUE. POST TRANSFUSION SPECIMEN    HCT 16.7 (L) 36.0 - 46.0 %   MCV 96.0 78.0 -  100.0 fL   MCH 31.6 26.0 - 34.0 pg   MCHC 32.9 30.0 - 36.0 g/dL   RDW 20.4 (H) 11.5 - 15.5 %   Platelets 112 (L) 150 - 400 K/uL    Comment: REPEATED TO VERIFY SPECIMEN CHECKED FOR CLOTS PLATELET COUNT CONFIRMED BY SMEAR    Neutrophils Relative % 70 43 - 77 %   Lymphocytes Relative 14 12 - 46 %   Monocytes Relative 13 (H) 3 - 12 %   Eosinophils Relative 1 0 - 5 %   Basophils Relative 2 (H) 0 - 1 %   Band Neutrophils 0 0 - 10 %   Metamyelocytes Relative 0 %   Myelocytes 0 %   Promyelocytes Absolute 0 %   Blasts 0 %   nRBC 53 (H) 0 /100 WBC   Other 0 %   RBC Morphology SICKLE CELLS     Comment: TARGET CELLS POLYCHROMASIA PRESENT HOWELL/JOLLY BODIES    WBC Morphology MILD LEFT SHIFT (1-5% METAS, OCC MYELO, OCC BANDS)    Smear Review LARGE PLATELETS PRESENT    Neutro Abs 22.7 (H) 1.7 - 7.7 K/uL   Lymphs Abs 4.5 (H) 0.7 - 4.0 K/uL   Monocytes Absolute 4.2 (H) 0.1 - 1.0 K/uL   Eosinophils Absolute 0.3 0.0 - 0.7 K/uL   Basophils Absolute 0.6 (H) 0.0 - 0.1 K/uL  Reticulocytes     Status: Abnormal   Collection Time: 04/18/15  2:22 AM  Result Value Ref Range   Retic Ct Pct 14.2 (H) 0.4 - 3.1 %   RBC. 1.74 (L) 3.87 - 5.11 MIL/uL   Retic Count, Manual 247.1 (H) 19.0 - 186.0 K/uL  Magnesium     Status: None   Collection Time: 04/18/15  2:22 AM  Result Value Ref Range   Magnesium 2.2 1.7 - 2.4 mg/dL  Phosphorus     Status: Abnormal   Collection Time: 04/18/15  2:22 AM  Result Value Ref Range   Phosphorus 10.0 (H) 2.5 - 4.6 mg/dL  Procalcitonin     Status: None   Collection Time: 04/18/15  2:22 AM  Result Value Ref Range   Procalcitonin 2.74 ng/mL    Comment:        Interpretation: PCT > 2 ng/mL: Systemic infection (sepsis) is likely, unless other causes are known. (NOTE)         ICU PCT Algorithm               Non ICU PCT Algorithm    ----------------------------     ------------------------------         PCT < 0.25 ng/mL                 PCT < 0.1 ng/mL     Stopping of  antibiotics            Stopping of antibiotics       strongly encouraged.               strongly encouraged.    ----------------------------     ------------------------------       PCT level decrease by  PCT < 0.25 ng/mL       >= 80% from peak PCT       OR PCT 0.25 - 0.5 ng/mL          Stopping of antibiotics                                             encouraged.     Stopping of antibiotics           encouraged.    ----------------------------     ------------------------------       PCT level decrease by              PCT >= 0.25 ng/mL       < 80% from peak PCT        AND PCT >= 0.5 ng/mL            Continuing antibiotics                                               encouraged.       Continuing antibiotics            encouraged.    ----------------------------     ------------------------------     PCT level increase compared          PCT > 0.5 ng/mL         with peak PCT AND          PCT >= 0.5 ng/mL             Escalation of antibiotics                                          strongly encouraged.      Escalation of antibiotics        strongly encouraged.   Lactic acid, plasma     Status: Abnormal   Collection Time: 04/18/15  2:22 AM  Result Value Ref Range   Lactic Acid, Venous 8.0 (HH) 0.5 - 2.0 mmol/L    Comment: REPEATED TO VERIFY CRITICAL RESULT CALLED TO, READ BACK BY AND VERIFIED WITH: M REEVES RN AT [phone] ON 06.29.16 BY G KONTOS   Prepare fresh frozen plasma     Status: None (Preliminary result)   Collection Time: 04/18/15  2:26 AM  Result Value Ref Range   Unit Number [id number]    Blood Component Type THAWED PLASMA    Unit division 00    Status of Unit ISSUED    Transfusion Status OK TO TRANSFUSE    Unit Number [id number]    Blood Component Type THAWED PLASMA    Unit division 00    Status of Unit ISSUED    Transfusion Status OK TO TRANSFUSE   Glucose, capillary     Status: None   Collection Time: 04/18/15  5:52 AM  Result Value Ref  Range   Glucose-Capillary 98 65 - 99 mg/dL  Blood gas, arterial     Status: Abnormal   Collection Time: 04/18/15  5:53 AM  Result Value Ref Range   FIO2 0.70 %   Delivery systems VENTILATOR    Mode PRESSURE REGULATED  VOLUME CONTROL    VT 490 mL   Rate 28 resp/min   Peep/cpap 5.0 cm H20   pH, Arterial 7.196 (LL) 7.350 - 7.450    Comment: CRITICAL RESULT CALLED TO, READ BACK BY AND VERIFIED WITH: Jene Every, RN AT [phone] ON 04/18/15 BY D. JONES, RRT, RCP    pCO2 arterial 23.6 (L) 35.0 - 45.0 mmHg   pO2, Arterial 288 (H) 80.0 - 100.0 mmHg   Bicarbonate 8.9 (L) 20.0 - 24.0 mEq/L   TCO2 9.0 0 - 100 mmol/L   Acid-base deficit 17.8 (H) 0.0 - 2.0 mmol/L   O2 Saturation 99.7 %   Patient temperature 36.3    Collection site ARTERIAL LINE    Drawn by [id number]    Sample type ARTERIAL DRAW   Glucose, capillary     Status: Abnormal   Collection Time: 04/18/15  6:17 AM  Result Value Ref Range   Glucose-Capillary 187 (H) 65 - 99 mg/dL  Glucose, capillary     Status: Abnormal   Collection Time: 04/18/15  7:41 AM  Result Value Ref Range   Glucose-Capillary 110 (H) 65 - 99 mg/dL   Comment 1 Notify RN    Comment 2 Document in Chart   Protime-INR     Status: Abnormal   Collection Time: 04/18/15 11:10 AM  Result Value Ref Range   Prothrombin Time 21.7 (H) 11.6 - 15.2 seconds   INR 1.90 (H) 0.00 - 1.49  APTT     Status: Abnormal   Collection Time: 04/18/15 11:10 AM  Result Value Ref Range   aPTT 78 (H) 24 - 37 seconds    Comment:        IF BASELINE aPTT IS ELEVATED, SUGGEST PATIENT RISK ASSESSMENT BE USED TO DETERMINE APPROPRIATE ANTICOAGULANT THERAPY.   Basic metabolic panel     Status: Abnormal   Collection Time: 04/18/15 11:10 AM  Result Value Ref Range   Sodium 141 135 - 145 mmol/L   Potassium 4.7 3.5 - 5.1 mmol/L    Comment: DELTA CHECK NOTED REPEATED TO VERIFY    Chloride 108 101 - 111 mmol/L   CO2 15 (L) 22 - 32 mmol/L   Glucose, Bld 102 (H) 65 - 99 mg/dL   BUN 20 6 - 20 mg/dL    Creatinine, Ser 0.61 0.44 - 1.00 mg/dL   Calcium 7.1 (L) 8.9 - 10.3 mg/dL   GFR calc non Af Amer >60 >60 mL/min   GFR calc Af Amer >60 >60 mL/min    Comment: (NOTE) The eGFR has been calculated using the CKD EPI equation. This calculation has not been validated in all clinical situations. eGFR's persistently <60 mL/min signify possible Chronic Kidney Disease.    Anion gap 18 (H) 5 - 15  Hemoglobin and hematocrit, blood     Status: Abnormal   Collection Time: 04/18/15 11:10 AM  Result Value Ref Range   Hemoglobin 5.7 (LL) 12.0 - 15.0 g/dL    Comment: REPEATED TO VERIFY CRITICAL VALUE NOTED.  VALUE IS CONSISTENT WITH PREVIOUSLY REPORTED AND CALLED VALUE.    HCT 17.0 (L) 36.0 - 46.0 %  Blood gas, arterial     Status: Abnormal   Collection Time: 04/18/15 11:53 AM  Result Value Ref Range   FIO2 0.30 %   Delivery systems VENTILATOR    Mode PRESSURE REGULATED VOLUME CONTROL    VT 490 mL   Rate 28 resp/min   Peep/cpap 5.0 cm H20   pH, Arterial 7.410 7.350 - 7.450  pCO2 arterial 20.1 (L) 35.0 - 45.0 mmHg   pO2, Arterial 105 (H) 80.0 - 100.0 mmHg   Bicarbonate 13.3 (L) 20.0 - 24.0 mEq/L   TCO2 13.2 0 - 100 mmol/L   Acid-base deficit 11.1 (H) 0.0 - 2.0 mmol/L   O2 Saturation 99.0 %   Patient temperature 32.5    Collection site A-LINE    Drawn by [id number]    Sample type ARTERIAL DRAW     Ct Head Wo Contrast  04/18/2015   CLINICAL DATA:  Evaluate for acute encephalopathy. Hemorrhage after liver biopsy. History of sickle cell anemia, stroke.  EXAM: CT HEAD WITHOUT CONTRAST  TECHNIQUE: Contiguous axial images were obtained from the base of the skull through the vertex without intravenous contrast.  COMPARISON:  MRI of the brain January 22, 2015  FINDINGS: The ventricles and sulci are normal. No intraparenchymal hemorrhage, mass effect nor midline shift. No acute large vascular territory infarcts.  No abnormal extra-axial fluid collections. Basal cisterns are patent.  No skull fracture. The  included ocular globes and orbital contents are non-suspicious. The mastoid aircells and included paranasal sinuses are well-aerated.  IMPRESSION: No acute intracranial process; normal noncontrast CT head for age.   Electronically Signed   By: Elon Alas M.D.   On: 04/18/2015 06:17   Ct Abdomen Pelvis W Contrast  04/18/2015   CLINICAL DATA:  Dropping hemoglobin after liver biopsy 04/17/2015.  EXAM: CT ABDOMEN AND PELVIS WITH CONTRAST  TECHNIQUE: Multidetector CT imaging of the abdomen and pelvis was performed using the standard protocol following bolus administration of intravenous contrast.  CONTRAST:  17m OMNIPAQUE IOHEXOL 300 MG/ML  SOLN  COMPARISON:  None.  FINDINGS: There is a large volume of peritoneal fluid. This probably is blood based on density of the fluid. There is active arterial hemorrhage during the scan, at the anterior surface of the left hepatic lobe. There are a few bubbles of peritoneal air, presumably from the percutaneous liver biopsy.  The liver is enlarged and cirrhotic. No intraparenchymal or subcapsular hemorrhage is evident. No focal liver lesion is evident. There is prior cholecystectomy.  There are unremarkable appearances of the pancreas and adrenals.  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 The spleen is either absent or very diminutive, consistent with the known sickle cell disease.  The abdominal aorta is normal in caliber. The inferior vena cava is collapsed, consistent with hypovolemia.  Bowel is unremarkable.  There is a Foley catheter and a rectal catheter.  Minimal atelectatic appearing posterior lung base opacities are present bilaterally, as well as trace pleural effusions.  IMPRESSION: Large volume hemoperitoneum with active arterial hemorrhage from the anterior surface of the left hepatic lobe. Recommend IR consultation to consider embolization. These results were called by telephone at the time of interpretation on 04/18/2015 at 1:54 am to charge nurse HJennye Moccasin who verbally acknowledged these results. I provided him with the name and pager number of the interventionalist on call.  Enlarged, cirrhotic liver.  Bilateral renal infarctions   Electronically Signed   By: DAndreas NewportM.D.   On: 04/18/2015 01:57   Ir Angiogram Visceral Selective  04/18/2015   INDICATION: History of cirrhosis of uncertain etiology post ultrasound-guided liver biopsy on 04/17/2015, now with hemodynamic instability and postprocedural CT scan demonstrated active extravasation from the left lobe of the liver. Please perform mesenteric arteriogram and possible embolization.  Patient also with acute renal insufficiency. Please perform ultrasound fluoroscopic guided temporary dialysis catheter placement.  EXAM: 1. ULTRASOUND GUIDANCE FOR ARTERIAL  ACCESS 2. CELIAC ARTERIOGRAM 3. SELECTIVE LEFT GASTRIC ARTERIOGRAM 4. SELECTIVE ACCESSORY LEFT HEPATIC ARTERIOGRAM 5. SELECTIVE ARTERIOGRAM OF THE ANTERIOR DIVISION OF THE ACCESSORY LEFT HEPATIC ARTERY AND PERCUTANEOUS PARTICLE EMBOLIZATION 6. ULTRASOUND AND FLUOROSCOPIC GUIDED LEFT COMMON FEMORAL VEIN APPROACH TEMPORARY DIALYSIS CATHETER.  COMPARISON:  CT the abdomen pelvis - 04/18/2015; ultrasound-guided random liver biopsy - 04/17/2015  MEDICATIONS: Patient intubated and sedated with continuous monitoring via the ICU staff.  CONTRAST:  76m OMNIPAQUE IOHEXOL 300 MG/ML SOLN, 561mOMNIPAQUE IOHEXOL 300 MG/ML SOLN  FLUOROSCOPY TIME:  1 minutes 42 seconds (1,6,063Gy)  COMPLICATIONS: None immediate  TECHNIQUE: Informed written consent was obtained from the patient's father after a discussion of the risks, benefits and alternatives to treatment. Questions regarding the procedure were encouraged and answered. A timeout was performed prior to the initiation of the procedure.  The bilateral groins were prepped and draped in the usual sterile fashion, and a sterile drape was applied covering the operative field. Maximum barrier sterile technique with  sterile gowns and gloves were used for the procedure. A timeout was performed prior to the initiation of the procedure. Local anesthesia was provided with 1% lidocaine.  The right femoral head was marked fluoroscopically. Under ultrasound guidance, the right common femoral artery was accessed with a micropuncture kit after the overlying soft tissues were anesthetized with 1% lidocaine. An ultrasound image was saved for documentation purposes. The micropuncture sheath was exchanged for a 5 Fr[place]ascular sheath over a Bentson wire. A closure arteriogram was performed through the side of the sheath confirming access within the right common femoral artery.  Over a Bentson wire, a Mickelson catheter was advanced to the level of the thoracic aorta where it was back bled and flushed. The catheter was then utilized to select the celiac artery and a celiac arteriogram was performed.  The Mickelson catheter was retracted to select the left gastric artery and a left gastric arteriogram was performed.  With the use of a Fathom micro wire, a high-flow Renegade micro catheter was advanced into the accessory left hepatic artery and a selective accessory left hepatic arteriogram was performed.  Over an exchange length microwire a regular Renegade micro catheter was advanced into the anterior segmental branch of the accessory left hepatic artery and a selective arteriogram was performed.  Despite prolonged efforts, the micro catheter could not be advanced into the tiny branch vessel of the left hepatic artery supplying the area of persistent active extravasation. As such, particle embolization was performed of the anterior dictation of the left lateral hepatic artery with 700 to 900 micron Embospheres.  The micro catheter was retracted into the accessory left hepatic artery and a completion accessory left hepatic arteriogram was performed. All wires and catheters were removed from the patient. The right common femoral artery  approach vascular sheath was secured at the right groin access site within interrupted suture and connected to a pressure bag.  Attention was now paid towards placement of the temporary dialysis catheter.  The left femoral head was marked fluoroscopically. Under direct ultrasound guidance, the left common femoral vein was accessed with a micropuncture kit. The track was dilated under intermittent fluoroscopic guidance ultimately allowing placement of a 20 cm non tunneled dialysis dialysis catheter with tip terminating within the distal aspect of the IVC. All lumens were noted to easily aspirate and flush peer and all lumens were flushed with appropriate heparin wells. Concern for dialysis catheter was secured in place within interrupted suture.  Dressings were applied. The patient  tolerated the above procedure well without immediate postprocedural complication and remained hemodynamically stable throughout the procedure.  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  Selective left gastric and accessory left hepatic arteriograms demonstrates an ill-defined area of active extravasation arising from an anterior segmental branch of the medial segment of the left lobe of the liver correlating with prior biopsy site.  Diffuse vessel spasm was noted about this distal vessel and as such, sub selective arteriogram and coil embolization could not be performed. Rather the anterior segmental vascular distribution was particle embolized with Embospheres.  Completion arteriogram demonstrates complete stasis of the anterior segmental branch of the accessory left hepatic artery with no additional areas of active extravasation.  Following ultrasound fluoroscopic guided placement, the temporary dialysis catheter tip terminates within the caudal  aspect of the IVC.  IMPRESSION: 1. Technically successful particle embolization of the anterior division of the accessory left hepatic artery for active hepatic arterial extravasation. 2. Successful ultrasound fluoroscopic guided placement of a left common femoral vein approach temporary dialysis catheter with tip terminating within the caudal aspect of the IVC. The temporary dialysis catheter is ready for immediate use.   Electronically Signed   By: Sandi Mariscal M.D.   On: 04/18/2015 10:32   Ir Angiogram Selective Each Additional Vessel  04/18/2015   INDICATION: History of cirrhosis of uncertain etiology post ultrasound-guided liver biopsy on 04/17/2015, now with hemodynamic instability and postprocedural CT scan demonstrated active extravasation from the left lobe of the liver. Please perform mesenteric arteriogram and possible embolization.  Patient also with acute renal insufficiency. Please perform ultrasound fluoroscopic guided temporary dialysis catheter placement.  EXAM: 1. ULTRASOUND GUIDANCE FOR ARTERIAL ACCESS 2. CELIAC ARTERIOGRAM 3. SELECTIVE LEFT GASTRIC ARTERIOGRAM 4. SELECTIVE ACCESSORY LEFT HEPATIC ARTERIOGRAM 5. SELECTIVE ARTERIOGRAM OF THE ANTERIOR DIVISION OF THE ACCESSORY LEFT HEPATIC ARTERY AND PERCUTANEOUS PARTICLE EMBOLIZATION 6. ULTRASOUND AND FLUOROSCOPIC GUIDED LEFT COMMON FEMORAL VEIN APPROACH TEMPORARY DIALYSIS CATHETER.  COMPARISON:  CT the abdomen pelvis - 04/18/2015; ultrasound-guided random liver biopsy - 04/17/2015  MEDICATIONS: Patient intubated and sedated with continuous monitoring via the ICU staff.  CONTRAST:  44m OMNIPAQUE IOHEXOL 300 MG/ML SOLN, 534mOMNIPAQUE IOHEXOL 300 MG/ML SOLN  FLUOROSCOPY TIME:  1 minutes 42 seconds (1,1,610Gy)  COMPLICATIONS: None immediate  TECHNIQUE: Informed written consent was obtained from the patient's father after a discussion of the risks, benefits and alternatives to treatment. Questions regarding the procedure were encouraged and  answered. A timeout was performed prior to the initiation of the procedure.  The bilateral groins were prepped and draped in the usual sterile fashion, and a sterile drape was applied covering the operative field. Maximum barrier sterile technique with sterile gowns and gloves were used for the procedure. A timeout was performed prior to the initiation of the procedure. Local anesthesia was provided with 1% lidocaine.  The right femoral head was marked fluoroscopically. Under ultrasound guidance, the right common femoral artery was accessed with a micropuncture kit after the overlying soft tissues were anesthetized with 1% lidocaine. An ultrasound image was saved for documentation purposes. The micropuncture sheath was exchanged for a 5 Fr[place]ascular sheath over a Bentson wire. A closure arteriogram was performed through the side of the sheath confirming access within the right common femoral artery.  Over a Bentson wire, a Mickelson catheter was advanced to the  level of the thoracic aorta where it was back bled and flushed. The catheter was then utilized to select the celiac artery and a celiac arteriogram was performed.  The Mickelson catheter was retracted to select the left gastric artery and a left gastric arteriogram was performed.  With the use of a Fathom micro wire, a high-flow Renegade micro catheter was advanced into the accessory left hepatic artery and a selective accessory left hepatic arteriogram was performed.  Over an exchange length microwire a regular Renegade micro catheter was advanced into the anterior segmental branch of the accessory left hepatic artery and a selective arteriogram was performed.  Despite prolonged efforts, the micro catheter could not be advanced into the tiny branch vessel of the left hepatic artery supplying the area of persistent active extravasation. As such, particle embolization was performed of the anterior dictation of the left lateral hepatic artery with 700 to 900  micron Embospheres.  The micro catheter was retracted into the accessory left hepatic artery and a completion accessory left hepatic arteriogram was performed. All wires and catheters were removed from the patient. The right common femoral artery approach vascular sheath was secured at the right groin access site within interrupted suture and connected to a pressure bag.  Attention was now paid towards placement of the temporary dialysis catheter.  The left femoral head was marked fluoroscopically. Under direct ultrasound guidance, the left common femoral vein was accessed with a micropuncture kit. The track was dilated under intermittent fluoroscopic guidance ultimately allowing placement of a 20 cm non tunneled dialysis dialysis catheter with tip terminating within the distal aspect of the IVC. All lumens were noted to easily aspirate and flush peer and all lumens were flushed with appropriate heparin wells. Concern for dialysis catheter was secured in place within interrupted suture.  Dressings were applied. The patient tolerated the above procedure well without immediate postprocedural complication and remained hemodynamically stable throughout the procedure.  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  Selective left gastric and accessory left hepatic arteriograms demonstrates an ill-defined area of active extravasation arising from an anterior segmental branch of the medial segment of the left lobe of the liver correlating with prior biopsy site.  Diffuse vessel spasm was noted about this distal vessel and as such, sub selective arteriogram and coil embolization could not be performed. Rather the anterior segmental vascular distribution was particle embolized with Embospheres.  Completion arteriogram demonstrates complete  stasis of the anterior segmental branch of the accessory left hepatic artery with no additional areas of active extravasation.  Following ultrasound fluoroscopic guided placement, the temporary dialysis catheter tip terminates within the caudal aspect of the IVC.  IMPRESSION: 1. Technically successful particle embolization of the anterior division of the accessory left hepatic artery for active hepatic arterial extravasation. 2. Successful ultrasound fluoroscopic guided placement of a left common femoral vein approach temporary dialysis catheter with tip terminating within the caudal aspect of the IVC. The temporary dialysis catheter is ready for immediate use.   Electronically Signed   By: Sandi Mariscal M.D.   On: 04/18/2015 10:32   Ir Angiogram Selective Each Additional Vessel  04/18/2015   INDICATION: History of cirrhosis of uncertain etiology post ultrasound-guided liver biopsy on 04/17/2015, now with hemodynamic instability and postprocedural CT scan demonstrated active extravasation from the left lobe of the liver.  Please perform mesenteric arteriogram and possible embolization.  Patient also with acute renal insufficiency. Please perform ultrasound fluoroscopic guided temporary dialysis catheter placement.  EXAM: 1. ULTRASOUND GUIDANCE FOR ARTERIAL ACCESS 2. CELIAC ARTERIOGRAM 3. SELECTIVE LEFT GASTRIC ARTERIOGRAM 4. SELECTIVE ACCESSORY LEFT HEPATIC ARTERIOGRAM 5. SELECTIVE ARTERIOGRAM OF THE ANTERIOR DIVISION OF THE ACCESSORY LEFT HEPATIC ARTERY AND PERCUTANEOUS PARTICLE EMBOLIZATION 6. ULTRASOUND AND FLUOROSCOPIC GUIDED LEFT COMMON FEMORAL VEIN APPROACH TEMPORARY DIALYSIS CATHETER.  COMPARISON:  CT the abdomen pelvis - 04/18/2015; ultrasound-guided random liver biopsy - 04/17/2015  MEDICATIONS: Patient intubated and sedated with continuous monitoring via the ICU staff.  CONTRAST:  81m OMNIPAQUE IOHEXOL 300 MG/ML SOLN, 541mOMNIPAQUE IOHEXOL 300 MG/ML SOLN  FLUOROSCOPY TIME:  1 minutes 42 seconds (1,8,299mGy)  COMPLICATIONS: None immediate  TECHNIQUE: Informed written consent was obtained from the patient's father after a discussion of the risks, benefits and alternatives to treatment. Questions regarding the procedure were encouraged and answered. A timeout was performed prior to the initiation of the procedure.  The bilateral groins were prepped and draped in the usual sterile fashion, and a sterile drape was applied covering the operative field. Maximum barrier sterile technique with sterile gowns and gloves were used for the procedure. A timeout was performed prior to the initiation of the procedure. Local anesthesia was provided with 1% lidocaine.  The right femoral head was marked fluoroscopically. Under ultrasound guidance, the right common femoral artery was accessed with a micropuncture kit after the overlying soft tissues were anesthetized with 1% lidocaine. An ultrasound image was saved for documentation purposes. The micropuncture sheath was exchanged for a 5 Fr[place]ascular sheath over a Bentson wire. A closure arteriogram was performed through the side of the sheath confirming access within the right common femoral artery.  Over a Bentson wire, a Mickelson catheter was advanced to the level of the thoracic aorta where it was back bled and flushed. The catheter was then utilized to select the celiac artery and a celiac arteriogram was performed.  The Mickelson catheter was retracted to select the left gastric artery and a left gastric arteriogram was performed.  With the use of a Fathom micro wire, a high-flow Renegade micro catheter was advanced into the accessory left hepatic artery and a selective accessory left hepatic arteriogram was performed.  Over an exchange length microwire a regular Renegade micro catheter was advanced into the anterior segmental branch of the accessory left hepatic artery and a selective arteriogram was performed.  Despite prolonged efforts, the micro catheter could not be  advanced into the tiny branch vessel of the left hepatic artery supplying the area of persistent active extravasation. As such, particle embolization was performed of the anterior dictation of the left lateral hepatic artery with 700 to 900 micron Embospheres.  The micro catheter was retracted into the accessory left hepatic artery and a completion accessory left hepatic arteriogram was performed. All wires and catheters were removed from the patient. The right common femoral artery approach vascular sheath was secured at the right groin access site within interrupted suture and connected to a pressure bag.  Attention was now paid towards placement of the temporary dialysis catheter.  The left femoral head was marked fluoroscopically. Under direct ultrasound guidance, the left common femoral vein was accessed with a micropuncture kit. The track was dilated under intermittent fluoroscopic guidance ultimately allowing placement of a 20 cm non tunneled dialysis dialysis catheter with tip terminating within the distal aspect of the IVC. All lumens were noted to easily  aspirate and flush peer and all lumens were flushed with appropriate heparin wells. Concern for dialysis catheter was secured in place within interrupted suture.  Dressings were applied. The patient tolerated the above procedure well without immediate postprocedural complication and remained hemodynamically stable throughout the procedure.  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  Selective left gastric and accessory left hepatic arteriograms demonstrates an ill-defined area of active extravasation arising from an anterior segmental branch of the medial segment of the left lobe of the liver correlating with prior biopsy site.  Diffuse vessel spasm was noted about this  distal vessel and as such, sub selective arteriogram and coil embolization could not be performed. Rather the anterior segmental vascular distribution was particle embolized with Embospheres.  Completion arteriogram demonstrates complete stasis of the anterior segmental branch of the accessory left hepatic artery with no additional areas of active extravasation.  Following ultrasound fluoroscopic guided placement, the temporary dialysis catheter tip terminates within the caudal aspect of the IVC.  IMPRESSION: 1. Technically successful particle embolization of the anterior division of the accessory left hepatic artery for active hepatic arterial extravasation. 2. Successful ultrasound fluoroscopic guided placement of a left common femoral vein approach temporary dialysis catheter with tip terminating within the caudal aspect of the IVC. The temporary dialysis catheter is ready for immediate use.   Electronically Signed   By: Sandi Mariscal M.D.   On: 04/18/2015 10:32   US Biopsy  04/17/2015   CLINICAL DATA:  29 year old with sickle cell anemia and cirrhosis. Request for liver biopsy.  EXAM: ULTRASOUND GUIDED LIVER BIOPSY  Physician: Stephan Minister. Anselm Pancoast, MD  FLUOROSCOPY TIME:  None  MEDICATIONS: 3 mg Versed, 75 mcg fentanyl. A radiology nurse monitored the patient for moderate sedation.  ANESTHESIA/SEDATION: Moderate sedation time: 20 minutes  PROCEDURE: The procedure was explained to the patient. The risks and benefits of the procedure were discussed and the patient's questions were addressed. Informed consent was obtained from the patient. The liver was evaluated with ultrasound. Left hepatic lobe was selected for biopsy. The anterior abdomen was prepped and draped in sterile fashion. 1% lidocaine was used for local anesthetic. Using ultrasound guidance, 17 gauge needle was directed into the left hepatic lobe. Three core biopsies were obtained with an 18 gauge core device. The tract was embolized with Gel-Foam. Bandage  placed over the puncture site.  FINDINGS: The liver has a nodular contour. Small amount of ascites along the right side of the liver. Left hepatic lobe was selected for biopsy because there was no adjacent ascites. No evidence for bleeding or hematoma following the core biopsies.  Estimated blood loss: Minimal  COMPLICATIONS: None  IMPRESSION: Ultrasound-guided core biopsies of the left hepatic lobe.   Electronically Signed   By: Markus Daft M.D.   On: 04/17/2015 13:02   Ir Fluoro Guide Cv Line Left  04/18/2015   INDICATION: History of cirrhosis of uncertain etiology post ultrasound-guided liver biopsy on 04/17/2015, now with hemodynamic instability and postprocedural CT scan demonstrated active extravasation from the left lobe of the liver. Please perform mesenteric arteriogram and possible embolization.  Patient also with acute renal insufficiency. Please perform ultrasound fluoroscopic guided temporary dialysis catheter placement.  EXAM: 1. ULTRASOUND GUIDANCE FOR ARTERIAL ACCESS 2. CELIAC ARTERIOGRAM 3. SELECTIVE LEFT GASTRIC ARTERIOGRAM 4. SELECTIVE ACCESSORY LEFT HEPATIC ARTERIOGRAM 5. SELECTIVE ARTERIOGRAM  OF THE ANTERIOR DIVISION OF THE ACCESSORY LEFT HEPATIC ARTERY AND PERCUTANEOUS PARTICLE EMBOLIZATION 6. ULTRASOUND AND FLUOROSCOPIC GUIDED LEFT COMMON FEMORAL VEIN APPROACH TEMPORARY DIALYSIS CATHETER.  COMPARISON:  CT the abdomen pelvis - 04/18/2015; ultrasound-guided random liver biopsy - 04/17/2015  MEDICATIONS: Patient intubated and sedated with continuous monitoring via the ICU staff.  CONTRAST:  73m OMNIPAQUE IOHEXOL 300 MG/ML SOLN, 559mOMNIPAQUE IOHEXOL 300 MG/ML SOLN  FLUOROSCOPY TIME:  1 minutes 42 seconds (1,3,329Gy)  COMPLICATIONS: None immediate  TECHNIQUE: Informed written consent was obtained from the patient's father after a discussion of the risks, benefits and alternatives to treatment. Questions regarding the procedure were encouraged and answered. A timeout was performed prior to the  initiation of the procedure.  The bilateral groins were prepped and draped in the usual sterile fashion, and a sterile drape was applied covering the operative field. Maximum barrier sterile technique with sterile gowns and gloves were used for the procedure. A timeout was performed prior to the initiation of the procedure. Local anesthesia was provided with 1% lidocaine.  The right femoral head was marked fluoroscopically. Under ultrasound guidance, the right common femoral artery was accessed with a micropuncture kit after the overlying soft tissues were anesthetized with 1% lidocaine. An ultrasound image was saved for documentation purposes. The micropuncture sheath was exchanged for a 5 Fr[place]ascular sheath over a Bentson wire. A closure arteriogram was performed through the side of the sheath confirming access within the right common femoral artery.  Over a Bentson wire, a Mickelson catheter was advanced to the level of the thoracic aorta where it was back bled and flushed. The catheter was then utilized to select the celiac artery and a celiac arteriogram was performed.  The Mickelson catheter was retracted to select the left gastric artery and a left gastric arteriogram was performed.  With the use of a Fathom micro wire, a high-flow Renegade micro catheter was advanced into the accessory left hepatic artery and a selective accessory left hepatic arteriogram was performed.  Over an exchange length microwire a regular Renegade micro catheter was advanced into the anterior segmental branch of the accessory left hepatic artery and a selective arteriogram was performed.  Despite prolonged efforts, the micro catheter could not be advanced into the tiny branch vessel of the left hepatic artery supplying the area of persistent active extravasation. As such, particle embolization was performed of the anterior dictation of the left lateral hepatic artery with 700 to 900 micron Embospheres.  The micro catheter was  retracted into the accessory left hepatic artery and a completion accessory left hepatic arteriogram was performed. All wires and catheters were removed from the patient. The right common femoral artery approach vascular sheath was secured at the right groin access site within interrupted suture and connected to a pressure bag.  Attention was now paid towards placement of the temporary dialysis catheter.  The left femoral head was marked fluoroscopically. Under direct ultrasound guidance, the left common femoral vein was accessed with a micropuncture kit. The track was dilated under intermittent fluoroscopic guidance ultimately allowing placement of a 20 cm non tunneled dialysis dialysis catheter with tip terminating within the distal aspect of the IVC. All lumens were noted to easily aspirate and flush peer and all lumens were flushed with appropriate heparin wells. Concern for dialysis catheter was secured in place within interrupted suture.  Dressings were applied. The patient tolerated the above procedure well without immediate postprocedural complication and remained hemodynamically stable throughout the procedure.  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  Selective left gastric and accessory left hepatic arteriograms demonstrates an ill-defined area of active extravasation arising from an anterior segmental branch of the medial segment of the left lobe of the liver correlating with prior biopsy site.  Diffuse vessel spasm was noted about this distal vessel and as such, sub selective arteriogram and coil embolization could not be performed. Rather the anterior segmental vascular distribution was particle embolized with Embospheres.  Completion arteriogram demonstrates complete stasis of the anterior segmental branch of the  accessory left hepatic artery with no additional areas of active extravasation.  Following ultrasound fluoroscopic guided placement, the temporary dialysis catheter tip terminates within the caudal aspect of the IVC.  IMPRESSION: 1. Technically successful particle embolization of the anterior division of the accessory left hepatic artery for active hepatic arterial extravasation. 2. Successful ultrasound fluoroscopic guided placement of a left common femoral vein approach temporary dialysis catheter with tip terminating within the caudal aspect of the IVC. The temporary dialysis catheter is ready for immediate use.   Electronically Signed   By: Sandi Mariscal M.D.   On: 04/18/2015 10:32   Ir US Guide Vasc Access Left  04/18/2015   INDICATION: History of cirrhosis of uncertain etiology post ultrasound-guided liver biopsy on 04/17/2015, now with hemodynamic instability and postprocedural CT scan demonstrated active extravasation from the left lobe of the liver. Please perform mesenteric arteriogram and possible embolization.  Patient also with acute renal insufficiency. Please perform ultrasound fluoroscopic guided temporary dialysis catheter placement.  EXAM: 1. ULTRASOUND GUIDANCE FOR ARTERIAL ACCESS 2. CELIAC ARTERIOGRAM 3. SELECTIVE LEFT GASTRIC ARTERIOGRAM 4. SELECTIVE ACCESSORY LEFT HEPATIC ARTERIOGRAM 5. SELECTIVE ARTERIOGRAM OF THE ANTERIOR DIVISION OF THE ACCESSORY LEFT HEPATIC ARTERY AND PERCUTANEOUS PARTICLE EMBOLIZATION 6. ULTRASOUND AND FLUOROSCOPIC GUIDED LEFT COMMON FEMORAL VEIN APPROACH TEMPORARY DIALYSIS CATHETER.  COMPARISON:  CT the abdomen pelvis - 04/18/2015; ultrasound-guided random liver biopsy - 04/17/2015  MEDICATIONS: Patient intubated and sedated with continuous monitoring via the ICU staff.  CONTRAST:  8m OMNIPAQUE IOHEXOL 300 MG/ML SOLN, 511mOMNIPAQUE IOHEXOL 300 MG/ML SOLN  FLUOROSCOPY TIME:  1 minutes 42 seconds (1,0,349Gy)  COMPLICATIONS: None immediate  TECHNIQUE: Informed written  consent was obtained from the patient's father after a discussion of the risks, benefits and alternatives to treatment. Questions regarding the procedure were encouraged and answered. A timeout was performed prior to the initiation of the procedure.  The bilateral groins were prepped and draped in the usual sterile fashion, and a sterile drape was applied covering the operative field. Maximum barrier sterile technique with sterile gowns and gloves were used for the procedure. A timeout was performed prior to the initiation of the procedure. Local anesthesia was provided with 1% lidocaine.  The right femoral head was marked fluoroscopically. Under ultrasound guidance, the right common femoral artery was accessed with a micropuncture kit after the overlying soft tissues were anesthetized with 1% lidocaine. An ultrasound image was saved for documentation purposes. The micropuncture sheath was exchanged for a 5 Fr[place]ascular sheath over a Bentson wire. A closure arteriogram was performed through the side of the sheath confirming access within the right common femoral artery.  Over a Bentson wire, a Mickelson catheter was advanced to the level of the thoracic aorta where it was back bled and flushed. The catheter was then utilized to  select the celiac artery and a celiac arteriogram was performed.  The Mickelson catheter was retracted to select the left gastric artery and a left gastric arteriogram was performed.  With the use of a Fathom micro wire, a high-flow Renegade micro catheter was advanced into the accessory left hepatic artery and a selective accessory left hepatic arteriogram was performed.  Over an exchange length microwire a regular Renegade micro catheter was advanced into the anterior segmental branch of the accessory left hepatic artery and a selective arteriogram was performed.  Despite prolonged efforts, the micro catheter could not be advanced into the tiny branch vessel of the left hepatic artery  supplying the area of persistent active extravasation. As such, particle embolization was performed of the anterior dictation of the left lateral hepatic artery with 700 to 900 micron Embospheres.  The micro catheter was retracted into the accessory left hepatic artery and a completion accessory left hepatic arteriogram was performed. All wires and catheters were removed from the patient. The right common femoral artery approach vascular sheath was secured at the right groin access site within interrupted suture and connected to a pressure bag.  Attention was now paid towards placement of the temporary dialysis catheter.  The left femoral head was marked fluoroscopically. Under direct ultrasound guidance, the left common femoral vein was accessed with a micropuncture kit. The track was dilated under intermittent fluoroscopic guidance ultimately allowing placement of a 20 cm non tunneled dialysis dialysis catheter with tip terminating within the distal aspect of the IVC. All lumens were noted to easily aspirate and flush peer and all lumens were flushed with appropriate heparin wells. Concern for dialysis catheter was secured in place within interrupted suture.  Dressings were applied. The patient tolerated the above procedure well without immediate postprocedural complication and remained hemodynamically stable throughout the procedure.  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  Selective left gastric and accessory left hepatic arteriograms demonstrates an ill-defined area of active extravasation arising from an anterior segmental branch of the medial segment of the left lobe of the liver correlating with prior biopsy site.  Diffuse vessel spasm was noted about this distal vessel and as such, sub selective arteriogram and coil  embolization could not be performed. Rather the anterior segmental vascular distribution was particle embolized with Embospheres.  Completion arteriogram demonstrates complete stasis of the anterior segmental branch of the accessory left hepatic artery with no additional areas of active extravasation.  Following ultrasound fluoroscopic guided placement, the temporary dialysis catheter tip terminates within the caudal aspect of the IVC.  IMPRESSION: 1. Technically successful particle embolization of the anterior division of the accessory left hepatic artery for active hepatic arterial extravasation. 2. Successful ultrasound fluoroscopic guided placement of a left common femoral vein approach temporary dialysis catheter with tip terminating within the caudal aspect of the IVC. The temporary dialysis catheter is ready for immediate use.   Electronically Signed   By: Sandi Mariscal M.D.   On: 04/18/2015 10:32   Ir US Guide Vasc Access Right  04/18/2015   INDICATION: History of cirrhosis of uncertain etiology post ultrasound-guided liver biopsy on 04/17/2015, now with hemodynamic instability and postprocedural CT scan demonstrated active extravasation from the left lobe of the liver. Please perform mesenteric arteriogram and possible embolization.  Patient also with acute renal insufficiency. Please perform ultrasound fluoroscopic  guided temporary dialysis catheter placement.  EXAM: 1. ULTRASOUND GUIDANCE FOR ARTERIAL ACCESS 2. CELIAC ARTERIOGRAM 3. SELECTIVE LEFT GASTRIC ARTERIOGRAM 4. SELECTIVE ACCESSORY LEFT HEPATIC ARTERIOGRAM 5. SELECTIVE ARTERIOGRAM OF THE ANTERIOR DIVISION OF THE ACCESSORY LEFT HEPATIC ARTERY AND PERCUTANEOUS PARTICLE EMBOLIZATION 6. ULTRASOUND AND FLUOROSCOPIC GUIDED LEFT COMMON FEMORAL VEIN APPROACH TEMPORARY DIALYSIS CATHETER.  COMPARISON:  CT the abdomen pelvis - 04/18/2015; ultrasound-guided random liver biopsy - 04/17/2015  MEDICATIONS: Patient intubated and sedated with continuous  monitoring via the ICU staff.  CONTRAST:  33m OMNIPAQUE IOHEXOL 300 MG/ML SOLN, 5104mOMNIPAQUE IOHEXOL 300 MG/ML SOLN  FLUOROSCOPY TIME:  1 minutes 42 seconds (1,6,962Gy)  COMPLICATIONS: None immediate  TECHNIQUE: Informed written consent was obtained from the patient's father after a discussion of the risks, benefits and alternatives to treatment. Questions regarding the procedure were encouraged and answered. A timeout was performed prior to the initiation of the procedure.  The bilateral groins were prepped and draped in the usual sterile fashion, and a sterile drape was applied covering the operative field. Maximum barrier sterile technique with sterile gowns and gloves were used for the procedure. A timeout was performed prior to the initiation of the procedure. Local anesthesia was provided with 1% lidocaine.  The right femoral head was marked fluoroscopically. Under ultrasound guidance, the right common femoral artery was accessed with a micropuncture kit after the overlying soft tissues were anesthetized with 1% lidocaine. An ultrasound image was saved for documentation purposes. The micropuncture sheath was exchanged for a 5 Fr[place]ascular sheath over a Bentson wire. A closure arteriogram was performed through the side of the sheath confirming access within the right common femoral artery.  Over a Bentson wire, a Mickelson catheter was advanced to the level of the thoracic aorta where it was back bled and flushed. The catheter was then utilized to select the celiac artery and a celiac arteriogram was performed.  The Mickelson catheter was retracted to select the left gastric artery and a left gastric arteriogram was performed.  With the use of a Fathom micro wire, a high-flow Renegade micro catheter was advanced into the accessory left hepatic artery and a selective accessory left hepatic arteriogram was performed.  Over an exchange length microwire a regular Renegade micro catheter was advanced into the  anterior segmental branch of the accessory left hepatic artery and a selective arteriogram was performed.  Despite prolonged efforts, the micro catheter could not be advanced into the tiny branch vessel of the left hepatic artery supplying the area of persistent active extravasation. As such, particle embolization was performed of the anterior dictation of the left lateral hepatic artery with 700 to 900 micron Embospheres.  The micro catheter was retracted into the accessory left hepatic artery and a completion accessory left hepatic arteriogram was performed. All wires and catheters were removed from the patient. The right common femoral artery approach vascular sheath was secured at the right groin access site within interrupted suture and connected to a pressure bag.  Attention was now paid towards placement of the temporary dialysis catheter.  The left femoral head was marked fluoroscopically. Under direct ultrasound guidance, the left common femoral vein was accessed with a micropuncture kit. The track was dilated under intermittent fluoroscopic guidance ultimately allowing placement of a 20 cm non tunneled dialysis dialysis catheter with tip terminating within the distal aspect of the IVC. All lumens were noted to easily aspirate and flush peer and all lumens were flushed with appropriate heparin wells. Concern for dialysis catheter was  secured in place within interrupted suture.  Dressings were applied. The patient tolerated the above procedure well without immediate postprocedural complication and remained hemodynamically stable throughout the procedure.  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  Selective left gastric and accessory left hepatic arteriograms demonstrates an ill-defined area of active extravasation arising  from an anterior segmental branch of the medial segment of the left lobe of the liver correlating with prior biopsy site.  Diffuse vessel spasm was noted about this distal vessel and as such, sub selective arteriogram and coil embolization could not be performed. Rather the anterior segmental vascular distribution was particle embolized with Embospheres.  Completion arteriogram demonstrates complete stasis of the anterior segmental branch of the accessory left hepatic artery with no additional areas of active extravasation.  Following ultrasound fluoroscopic guided placement, the temporary dialysis catheter tip terminates within the caudal aspect of the IVC.  IMPRESSION: 1. Technically successful particle embolization of the anterior division of the accessory left hepatic artery for active hepatic arterial extravasation. 2. Successful ultrasound fluoroscopic guided placement of a left common femoral vein approach temporary dialysis catheter with tip terminating within the caudal aspect of the IVC. The temporary dialysis catheter is ready for immediate use.   Electronically Signed   By: Sandi Mariscal M.D.   On: 04/18/2015 10:32   Dg Chest Port 1 View  04/18/2015   CLINICAL DATA:  Endotracheal tube and central line placements  EXAM: PORTABLE CHEST - 1 VIEW  COMPARISON:  04/17/2015  FINDINGS: The endotracheal tube tip is 1.5 cm above the carina. The nasogastric tube extends into the stomach. There is a left jugular central line with tip in the cavoatrial junction. There is no pneumothorax. There is no dense airspace consolidation. There is no large effusion.  IMPRESSION: Support equipment appears satisfactorily positioned.   Electronically Signed   By: Andreas Newport M.D.   On: 04/18/2015 00:40   Dg Chest Port 1 View  04/17/2015   CLINICAL DATA:  Shortness of Breath  EXAM: PORTABLE CHEST - 1 VIEW  COMPARISON:  April 03, 2015  FINDINGS: Degree of inspiration is shallow. There is no edema or consolidation. Heart  size and pulmonary vascularity are within normal limits given the degree of shallow inspiratory effort. No adenopathy. No bone lesions. No pneumothorax.  IMPRESSION: No edema or consolidation.  Note shallow degree of inspiration.   Electronically Signed   By: Lowella Grip III M.D.   On: 04/17/2015 20:21   Dg Abd Portable 1v  04/17/2015   CLINICAL DATA:  29 year old female abdominal pain and distention x2 weeks.  EXAM: PORTABLE ABDOMEN - 1 VIEW  COMPARISON:  Abdominal ultrasound dated 03/25/2015 and MRI dated 04/12/2015.  FINDINGS: This is a nonspecific bowel gas pattern. Moderate stool noted throughout the colon. Right upper quadrant cholecystectomy clips. The osseous structures appear unremarkable.  IMPRESSION: Nonspecific bowel gas pattern without definite evidence of obstruction.   Electronically Signed   By: Anner Crete M.D.   On: 04/17/2015 20:26   [place] Guide [employer]  04/18/2015   INDICATION: History of cirrhosis of uncertain etiology post ultrasound-guided liver biopsy on 04/17/2015, now with hemodynamic instability and postprocedural CT scan demonstrated active extravasation from the left lobe of the liver. Please perform mesenteric arteriogram and possible embolization.  Patient  also with acute renal insufficiency. Please perform ultrasound fluoroscopic guided temporary dialysis catheter placement.  EXAM: 1. ULTRASOUND GUIDANCE FOR ARTERIAL ACCESS 2. CELIAC ARTERIOGRAM 3. SELECTIVE LEFT GASTRIC ARTERIOGRAM 4. SELECTIVE ACCESSORY LEFT HEPATIC ARTERIOGRAM 5. SELECTIVE ARTERIOGRAM OF THE ANTERIOR DIVISION OF THE ACCESSORY LEFT HEPATIC ARTERY AND PERCUTANEOUS PARTICLE EMBOLIZATION 6. ULTRASOUND AND FLUOROSCOPIC GUIDED LEFT COMMON FEMORAL VEIN APPROACH TEMPORARY DIALYSIS CATHETER.  COMPARISON:  CT the abdomen pelvis - 04/18/2015; ultrasound-guided random liver biopsy - 04/17/2015  MEDICATIONS: Patient intubated and sedated with continuous monitoring via the  ICU staff.  CONTRAST:  43m OMNIPAQUE IOHEXOL 300 MG/ML SOLN, 535mOMNIPAQUE IOHEXOL 300 MG/ML SOLN  FLUOROSCOPY TIME:  1 minutes 42 seconds (1,9,470Gy)  COMPLICATIONS: None immediate  TECHNIQUE: Informed written consent was obtained from the patient's father after a discussion of the risks, benefits and alternatives to treatment. Questions regarding the procedure were encouraged and answered. A timeout was performed prior to the initiation of the procedure.  The bilateral groins were prepped and draped in the usual sterile fashion, and a sterile drape was applied covering the operative field. Maximum barrier sterile technique with sterile gowns and gloves were used for the procedure. A timeout was performed prior to the initiation of the procedure. Local anesthesia was provided with 1% lidocaine.  The right femoral head was marked fluoroscopically. Under ultrasound guidance, the right common femoral artery was accessed with a micropuncture kit after the overlying soft tissues were anesthetized with 1% lidocaine. An ultrasound image was saved for documentation purposes. The micropuncture sheath was exchanged for a 5 Fr[place]ascular sheath over a Bentson wire. A closure arteriogram was performed through the side of the sheath confirming access within the right common femoral artery.  Over a Bentson wire, a Mickelson catheter was advanced to the level of the thoracic aorta where it was back bled and flushed. The catheter was then utilized to select the celiac artery and a celiac arteriogram was performed.  The Mickelson catheter was retracted to select the left gastric artery and a left gastric arteriogram was performed.  With the use of a Fathom micro wire, a high-flow Renegade micro catheter was advanced into the accessory left hepatic artery and a selective accessory left hepatic arteriogram was performed.  Over an exchange length microwire a regular Renegade micro catheter was advanced into the anterior segmental  branch of the accessory left hepatic artery and a selective arteriogram was performed.  Despite prolonged efforts, the micro catheter could not be advanced into the tiny branch vessel of the left hepatic artery supplying the area of persistent active extravasation. As such, particle embolization was performed of the anterior dictation of the left lateral hepatic artery with 700 to 900 micron Embospheres.  The micro catheter was retracted into the accessory left hepatic artery and a completion accessory left hepatic arteriogram was performed. All wires and catheters were removed from the patient. The right common femoral artery approach vascular sheath was secured at the right groin access site within interrupted suture and connected to a pressure bag.  Attention was now paid towards placement of the temporary dialysis catheter.  The left femoral head was marked fluoroscopically. Under direct ultrasound guidance, the left common femoral vein was accessed with a micropuncture kit. The track was dilated under intermittent fluoroscopic guidance ultimately allowing placement of a 20 cm non tunneled dialysis dialysis catheter with tip terminating within the distal aspect of the IVC. All lumens were noted to easily aspirate and flush peer and all lumens were flushed  with appropriate heparin wells. Concern for dialysis catheter was secured in place within interrupted suture.  Dressings were applied. The patient tolerated the above procedure well without immediate postprocedural complication and remained hemodynamically stable throughout the procedure.  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  Selective left gastric and accessory left hepatic arteriograms demonstrates an ill-defined area of active extravasation arising from an anterior  segmental branch of the medial segment of the left lobe of the liver correlating with prior biopsy site.  Diffuse vessel spasm was noted about this distal vessel and as such, sub selective arteriogram and coil embolization could not be performed. Rather the anterior segmental vascular distribution was particle embolized with Embospheres.  Completion arteriogram demonstrates complete stasis of the anterior segmental branch of the accessory left hepatic artery with no additional areas of active extravasation.  Following ultrasound fluoroscopic guided placement, the temporary dialysis catheter tip terminates within the caudal aspect of the IVC.  IMPRESSION: 1. Technically successful particle embolization of the anterior division of the accessory left hepatic artery for active hepatic arterial extravasation. 2. Successful ultrasound fluoroscopic guided placement of a left common femoral vein approach temporary dialysis catheter with tip terminating within the caudal aspect of the IVC. The temporary dialysis catheter is ready for immediate use.   Electronically Signed   By: Sandi Mariscal M.D.   On: 04/18/2015 10:32    Review of Systems  Unable to perform ROS: critical illness   Blood pressure 106/80, pulse 77, temperature 91.2 F (32.9 C), temperature source Core (Comment), resp. rate 28, height 5' 7"  (1.702 m), weight 70.1 kg (154 lb 8.7 oz), last menstrual period 04/03/2015, SpO2 100 %. Physical Exam  Constitutional:  Thin female on the VENT, sedated, coming off CVVH to go to IR for further evaluation and intervention.   HENT:  Head: Normocephalic and atraumatic.  Intubated and sedated  Cardiovascular: Normal rate, regular rhythm and normal heart sounds.   No murmur heard. Respiratory: She has no wheezes. She has no rales. She exhibits no tenderness.  On ventilator full support  GI: She exhibits distension.  Distended ,tense, no bowel sounds,  Groin dressing left thigh  Musculoskeletal: She exhibits  no edema.  Neurological:  Sedated on the Vent  Skin: Skin is warm and dry. No rash noted. No erythema. No pallor.    Assessment/Plan: 1.  S/p liver biopsy with hemoperitoneum  S/p particle embolization of the medial segmental branch of the left hepatic artery for active extravasation  & temporary HD catheter placement 04/18/15 in IR Dr. Pascal Lux 2.  Hemorrhagic shock 3.Ventilator dependant respiratory failure 4.  Acute kidney injury 5.  Hypercoagulable with elevated INR/PTT  6.  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  Plan:  Discussed and Dr. Marcello Moores has seen the patient.  There is nothing surgical to do at this point.  Correct coagulopathy, Continue to warm her, and warm IV fluids, blood products 1:1:1 ratio.  RBC:FFP: platelets.  We will follow and be available.  IR is seeing her now.  Fredricka Kohrs 04/18/2015, 12:33 PM

## 2015-04-18 NOTE — Progress Notes (Signed)
eLink Physician-Brief Progress Note Patient Name: Joan Martin DOB: [birth year] MRN: [id number]   S Rounds from [facility]  D/w RN - s/p Mesenteric angiogram, with coil embolization of left hepatic artery, inferior segment to hemorrhage. No flow through the segment at conclusion. No complication per NOte.Immediately after he case - bilateral epistaxisi - now resolved. RN also noticing blood clots in stool. And a PIV prick bled profusely. Also, a-line site bleeding. HD cath site bleeding.   SOme of it is all day but some of it now  She has had 2 PRBC, 2 FFP and 2 Plat so far ([facility] has no more platelets per RN)  Camera exam  - bair hugger - looks moribund - On VENT - on CVVH - not on pressors    PULMONARY  Recent Labs Lab 04/17/15 2051 04/17/15 2355 04/18/15 0553 04/18/15 1153 04/18/15 1600  PHART 7.071* 7.127* 7.196* 7.410 7.295*  PCO2ART 27.2* 24.0* 23.6* 20.1* 22.3*  PO2ART 33.5* 449* 288* 105* 74.6*  HCO3 7.8* 7.8* 8.9* 13.3* 11.0*  TCO2  --   --  9.0 13.2 11.0  O2SAT 35.5 VALUE ABOVE REPORTABLE RANGE 99.7 99.0 95.9    CBC  Recent Labs Lab 04/17/15 0353 04/17/15 2037 04/18/15 0222 04/18/15 1110  HGB 6.9* 4.7* 5.5* 5.7*  HCT 19.7* 13.8* 16.7* 17.0*  WBC 24.1* 30.5* 32.3*  --   PLT 152 89* 112*  --     COAGULATION  Recent Labs Lab 04/12/15 1300 04/17/15 2244 04/18/15 1110  INR 1.28 2.09* 1.90*    CARDIAC  No results for input(s): TROPONINI in the last 168 hours. No results for input(s): PROBNP in the last 168 hours.   CHEMISTRY  Recent Labs Lab 04/15/15 0611 04/17/15 0353 04/17/15 2037 04/18/15 0222 04/18/15 1110  NA 135 136 138 138 141  K 4.1 4.1 6.4* 7.2* 4.7  CL 116* 116* 117* 114* 108  CO2 14* 15* 9* 9* 15*  GLUCOSE 67 79 20* 96 102*  BUN 15 15 19  21* 20  CREATININE <0.30* 0.32* 0.62 1.09* 0.61  CALCIUM 8.6* 9.1 9.2 7.5* 7.1*  MG  --   --   --  2.2  --   PHOS  --   --   --  10.0*  --    Estimated Creatinine Clearance:  100 mL/min (by C-G formula based on Cr of 0.61).   LIVER  Recent Labs Lab 04/12/15 1300  04/13/15 0440 04/14/15 1247 04/15/15 0611 04/17/15 0353 04/17/15 2244 04/18/15 0222 04/18/15 1110  AST  --   < > 301* 427* 406* 347*  --  948*  --   ALT  --   < > 91* 123* 112* 101*  --  183*  --   ALKPHOS  --   < > 487* 540* 520* 506*  --  345*  --   BILITOT  --   < > 31.6* 40.6*  40.6* 33.0* 27.9*  --  25.5*  --   PROT  --   < > 5.8* 6.0* 5.7* 5.6*  --  4.6*  --   ALBUMIN  --   < > 1.7* 1.7* 1.6* 1.5*  --  1.2*  --   INR 1.28  --   --   --   --   --  2.09*  --  1.90*  < > = values in this interval not displayed.   INFECTIOUS  Recent Labs Lab 04/17/15 2143 04/18/15 0222  LATICACIDVEN 7.6* 8.0*  PROCALCITON  --  2.74  ENDOCRINE CBG (last 3)   Recent Labs  04/18/15 1121 04/18/15 1302 04/18/15 1706  GLUCAP 85 100* 99         IMAGING x48h  - image(s) personally visualized  -   highlighted in bold Ct Head Wo Contrast  04/18/2015   CLINICAL DATA:  Evaluate for acute encephalopathy. Hemorrhage after liver biopsy. History of sickle cell anemia, stroke.  EXAM: CT HEAD WITHOUT CONTRAST  TECHNIQUE: Contiguous axial images were obtained from the base of the skull through the vertex without intravenous contrast.  COMPARISON:  MRI of the brain January 22, 2015  FINDINGS: The ventricles and sulci are normal. No intraparenchymal hemorrhage, mass effect nor midline shift. No acute large vascular territory infarcts.  No abnormal extra-axial fluid collections. Basal cisterns are patent.  No skull fracture. The included ocular globes and orbital contents are non-suspicious. The mastoid aircells and included paranasal sinuses are well-aerated.  IMPRESSION: No acute intracranial process; normal noncontrast CT head for age.   Electronically Signed   By: Elon Alas M.D.   On: 04/18/2015 06:17   Ct Abdomen Pelvis W Contrast  04/18/2015   CLINICAL DATA:  Dropping hemoglobin after liver  biopsy 04/17/2015.  EXAM: CT ABDOMEN AND PELVIS WITH CONTRAST  TECHNIQUE: Multidetector CT imaging of the abdomen and pelvis was performed using the standard protocol following bolus administration of intravenous contrast.  CONTRAST:  144m OMNIPAQUE IOHEXOL 300 MG/ML  SOLN  COMPARISON:  None.  FINDINGS: There is a large volume of peritoneal fluid. This probably is blood based on density of the fluid. There is active arterial hemorrhage during the scan, at the anterior surface of the left hepatic lobe. There are a few bubbles of peritoneal air, presumably from the percutaneous liver biopsy.  The liver is enlarged and cirrhotic. No intraparenchymal or subcapsular hemorrhage is evident. No focal liver lesion is evident. There is prior cholecystectomy.  There are unremarkable appearances of the pancreas and adrenals.  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 The spleen is either absent or very diminutive, consistent with the known sickle cell disease.  The abdominal aorta is normal in caliber. The inferior vena cava is collapsed, consistent with hypovolemia.  Bowel is unremarkable.  There is a Foley catheter and a rectal catheter.  Minimal atelectatic appearing posterior lung base opacities are present bilaterally, as well as trace pleural effusions.  IMPRESSION: Large volume hemoperitoneum with active arterial hemorrhage from the anterior surface of the left hepatic lobe. Recommend IR consultation to consider embolization. These results were called by telephone at the time of interpretation on 04/18/2015 at 1:54 am to charge nurse HJennye Moccasin who verbally acknowledged these results. I provided him with the name and pager number of the interventionalist on call.  Enlarged, cirrhotic liver.  Bilateral renal infarctions   Electronically Signed   By: DAndreas NewportM.D.   On: 04/18/2015 01:57   Ir Angiogram Visceral Selective  04/18/2015   CLINICAL DATA:  29year old female with a history of recent percutaneous liver  biopsy for concern of cirrhosis diagnosis.  The patient subsequently developed intra abdominal hemorrhage and underwent embolization of left hepatic artery branches on the same day. Given that the patient is coagulopathic and is hypothermic, it seems as though she has continued to bleed through the particle embolization, with low hemoglobin and downward trend of her blood pressure.  She presents for mesenteric angiogram and potential further embolization.  EXAM: MESENTERIC ANGIOGRAM, WITH ANGIOGRAM OF THE CELIAC ARTERY, LEFT GASTRIC ARTERY, LEFT HEPATIC ARTERY, AND SEGMENTAL  BRANCHES OF THE LEFT HEPATIC ARTERY.  COIL EMBOLIZATION OF INFERIOR SEGMENT LEFT HEPATIC ARTERY.  ANESTHESIA/SEDATION: The patient is intubated, with management from respiratory therapy team.  2.0 Mg IV Versed; fentanyl infusion  Contrast Volume: 120 cc Omni 300  Additional Medications: 5 under mcg nitroglycerin, 2.5 mg intra-arterial verapamil  FLUOROSCOPY TIME:  29 minutes, 12 seconds Minutes.  PROCEDURE: The procedure, risks, benefits, and alternatives were explained to the patient's family. Questions regarding the procedure were encouraged and answered. The patient understands and consents to the procedure.  The indwelling right common femoral artery sheath was prepped and draped in the usual sterile fashion, as well as the right groin, which was prepped with Betadine in a sterile fashion, and a sterile drape was applied covering the operative field.  Wire was advanced through the indwelling sheath, which was then exchanged over the wire for a 35 cm 5 French sheath. This was attached to a pressure bag with heparinized saline.  A 5 [place] Mickelson catheter was then used to select the celiac artery and a celiac artery angiogram was performed with power injection.  The Mickelson catheter and a Glidewire were used to select the left gastric artery, with the [facility] the catheter advanced over the Glidewire into the proximal left gastric  artery.  Left gastric artery angiogram was performed.  100 mcg of nitroglycerin were then infused intra arterial.  A renegade STC catheter was then advanced over a 014 fathom wire through the left gastric artery to select the the replaced left hepatic artery, with sub selection of the inferior segments of the left hepatic artery, which were visualized to contribute to ongoing contrast pooling.  Sub selection of distal branches was performed with coil embolization with 2 mm and 3 mm diameter interlock coils.  Repeat angiogram of the segments demonstrated on going filling of the target vasculature, and further coil embolization of inferior segment of the left hepatic artery was required.  Gel-Foam slurry was infused after coil embolization of the downgoing/inferior segment.  Repeat angiogram demonstrated no further filling of the target vasculature.  No complications were encountered.  No significant blood loss was encountered during the procedure.  Patient remained hemodynamically stable through the procedure.  COMPLICATIONS: None.  FINDINGS: Angiogram of the celiac artery demonstrates variant anatomy of the hepatic vasculature, seen on prior study. Replaced left hepatic artery from left gastric artery again visualized, which was selected.  Angiogram demonstrates vessel spasm with attenuation of multiple distal branches, compatible with the patient's current hypovolemic shock.  Selected angiography of segments of the left hepatic artery, segment 4 branch, demonstrate ongoing contrast pooling in the similar location to the comparison study.  After coil embolization of the targeted vasculature, there is no further filling on contrast injection.  IMPRESSION: Status post mesenteric angiogram with sub selection of replaced left hepatic artery via the left gastric artery, with coil embolization of segment 4 branches of the left hepatic artery, which occluded flow to the target vasculature. No further flow to the targeted  vessels identified on the completion angiogram.  Signed,  Dulcy Fanny. Earleen Newport, DO  [facility]  [facility]   Electronically Signed   By: Corrie Mckusick D.O.   On: 04/18/2015 17:22   Ir Angiogram Visceral Selective  04/18/2015   INDICATION: History of cirrhosis of uncertain etiology post ultrasound-guided liver biopsy on 04/17/2015, now with hemodynamic instability and postprocedural CT scan demonstrated active extravasation from the left lobe of the liver. Please perform mesenteric arteriogram and  possible embolization.  Patient also with acute renal insufficiency. Please perform ultrasound fluoroscopic guided temporary dialysis catheter placement.  EXAM: 1. ULTRASOUND GUIDANCE FOR ARTERIAL ACCESS 2. CELIAC ARTERIOGRAM 3. SELECTIVE LEFT GASTRIC ARTERIOGRAM 4. SELECTIVE ACCESSORY LEFT HEPATIC ARTERIOGRAM 5. SELECTIVE ARTERIOGRAM OF THE ANTERIOR DIVISION OF THE ACCESSORY LEFT HEPATIC ARTERY AND PERCUTANEOUS PARTICLE EMBOLIZATION 6. ULTRASOUND AND FLUOROSCOPIC GUIDED LEFT COMMON FEMORAL VEIN APPROACH TEMPORARY DIALYSIS CATHETER.  COMPARISON:  CT the abdomen pelvis - 04/18/2015; ultrasound-guided random liver biopsy - 04/17/2015  MEDICATIONS: Patient intubated and sedated with continuous monitoring via the ICU staff.  CONTRAST:  57m OMNIPAQUE IOHEXOL 300 MG/ML SOLN, 554mOMNIPAQUE IOHEXOL 300 MG/ML SOLN  FLUOROSCOPY TIME:  1 minutes 42 seconds (1,1,287Gy)  COMPLICATIONS: None immediate  TECHNIQUE: Informed written consent was obtained from the patient's father after a discussion of the risks, benefits and alternatives to treatment. Questions regarding the procedure were encouraged and answered. A timeout was performed prior to the initiation of the procedure.  The bilateral groins were prepped and draped in the usual sterile fashion, and a sterile drape was applied covering the operative field. Maximum barrier sterile technique with sterile gowns and gloves were used for  the procedure. A timeout was performed prior to the initiation of the procedure. Local anesthesia was provided with 1% lidocaine.  The right femoral head was marked fluoroscopically. Under ultrasound guidance, the right common femoral artery was accessed with a micropuncture kit after the overlying soft tissues were anesthetized with 1% lidocaine. An ultrasound image was saved for documentation purposes. The micropuncture sheath was exchanged for a 5 Fr[place] sheath over a Bentson wire. A closure arteriogram was performed through the side of the sheath confirming access within the right common femoral artery.  Over a Bentson wire, a Mickelson catheter was advanced to the level of the thoracic aorta where it was back bled and flushed. The catheter was then utilized to select the celiac artery and a celiac arteriogram was performed.  The Mickelson catheter was retracted to select the left gastric artery and a left gastric arteriogram was performed.  With the use of a Fathom micro wire, a high-flow Renegade micro catheter was advanced into the accessory left hepatic artery and a selective accessory left hepatic arteriogram was performed.  Over an exchange length microwire a regular Renegade micro catheter was advanced into the anterior segmental branch of the accessory left hepatic artery and a selective arteriogram was performed.  Despite prolonged efforts, the micro catheter could not be advanced into the tiny branch vessel of the left hepatic artery supplying the area of persistent active extravasation. As such, particle embolization was performed of the anterior dictation of the left lateral hepatic artery with 700 to 900 micron Embospheres.  The micro catheter was retracted into the accessory left hepatic artery and a completion accessory left hepatic arteriogram was performed. All wires and catheters were removed from the patient. The right common femoral artery approach vascular sheath was secured at the  right groin access site within interrupted suture and connected to a pressure bag.  Attention was now paid towards placement of the temporary dialysis catheter.  The left femoral head was marked fluoroscopically. Under direct ultrasound guidance, the left common femoral vein was accessed with a micropuncture kit. The track was dilated under intermittent fluoroscopic guidance ultimately allowing placement of a 20 cm non tunneled dialysis dialysis catheter with tip terminating within the distal aspect of the IVC. All lumens were noted to easily aspirate and flush peer and  all lumens were flushed with appropriate heparin wells. Concern for dialysis catheter was secured in place within interrupted suture.  Dressings were applied. The patient tolerated the above procedure well without immediate postprocedural complication and remained hemodynamically stable throughout the procedure.  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  Selective left gastric and accessory left hepatic arteriograms demonstrates an ill-defined area of active extravasation arising from an anterior segmental branch of the medial segment of the left lobe of the liver correlating with prior biopsy site.  Diffuse vessel spasm was noted about this distal vessel and as such, sub selective arteriogram and coil embolization could not be performed. Rather the anterior segmental vascular distribution was particle embolized with Embospheres.  Completion arteriogram demonstrates complete stasis of the anterior segmental branch of the accessory left hepatic artery with no additional areas of active extravasation.  Following ultrasound fluoroscopic guided placement, the temporary dialysis catheter tip terminates within the caudal aspect of the IVC.  IMPRESSION: 1. Technically  successful particle embolization of the anterior division of the accessory left hepatic artery for active hepatic arterial extravasation. 2. Successful ultrasound fluoroscopic guided placement of a left common femoral vein approach temporary dialysis catheter with tip terminating within the caudal aspect of the IVC. The temporary dialysis catheter is ready for immediate use.   Electronically Signed   By: Sandi Mariscal M.D.   On: 04/18/2015 10:32   Ir Angiogram Selective Each Additional Vessel  04/18/2015   CLINICAL DATA:  29 year old female with a history of recent percutaneous liver biopsy for concern of cirrhosis diagnosis.  The patient subsequently developed intra abdominal hemorrhage and underwent embolization of left hepatic artery branches on the same day. Given that the patient is coagulopathic and is hypothermic, it seems as though she has continued to bleed through the particle embolization, with low hemoglobin and downward trend of her blood pressure.  She presents for mesenteric angiogram and potential further embolization.  EXAM: MESENTERIC ANGIOGRAM, WITH ANGIOGRAM OF THE CELIAC ARTERY, LEFT GASTRIC ARTERY, LEFT HEPATIC ARTERY, AND SEGMENTAL BRANCHES OF THE LEFT HEPATIC ARTERY.  COIL EMBOLIZATION OF INFERIOR SEGMENT LEFT HEPATIC ARTERY.  ANESTHESIA/SEDATION: The patient is intubated, with management from respiratory therapy team.  2.0 Mg IV Versed; fentanyl infusion  Contrast Volume: 120 cc Omni 300  Additional Medications: 5 under mcg nitroglycerin, 2.5 mg intra-arterial verapamil  FLUOROSCOPY TIME:  29 minutes, 12 seconds Minutes.  PROCEDURE: The procedure, risks, benefits, and alternatives were explained to the patient's family. Questions regarding the procedure were encouraged and answered. The patient understands and consents to the procedure.  The indwelling right common femoral artery sheath was prepped and draped in the usual sterile fashion, as well as the right groin, which was prepped with  Betadine in a sterile fashion, and a sterile drape was applied covering the operative field.  Wire was advanced through the indwelling sheath, which was then exchanged over the wire for a 35 cm 5 French sheath. This was attached to a pressure bag with heparinized saline.  A 5 [place] Mickelson catheter was then used to select the celiac artery and a celiac artery angiogram was performed with power injection.  The Mickelson catheter and a Glidewire were used to select the left gastric artery, with the [facility] the catheter advanced over the Glidewire into the proximal left gastric artery.  Left gastric  artery angiogram was performed.  100 mcg of nitroglycerin were then infused intra arterial.  A renegade STC catheter was then advanced over a 014 fathom wire through the left gastric artery to select the the replaced left hepatic artery, with sub selection of the inferior segments of the left hepatic artery, which were visualized to contribute to ongoing contrast pooling.  Sub selection of distal branches was performed with coil embolization with 2 mm and 3 mm diameter interlock coils.  Repeat angiogram of the segments demonstrated on going filling of the target vasculature, and further coil embolization of inferior segment of the left hepatic artery was required.  Gel-Foam slurry was infused after coil embolization of the downgoing/inferior segment.  Repeat angiogram demonstrated no further filling of the target vasculature.  No complications were encountered.  No significant blood loss was encountered during the procedure.  Patient remained hemodynamically stable through the procedure.  COMPLICATIONS: None.  FINDINGS: Angiogram of the celiac artery demonstrates variant anatomy of the hepatic vasculature, seen on prior study. Replaced left hepatic artery from left gastric artery again visualized, which was selected.  Angiogram demonstrates vessel spasm with attenuation of multiple distal branches, compatible with the  patient's current hypovolemic shock.  Selected angiography of segments of the left hepatic artery, segment 4 branch, demonstrate ongoing contrast pooling in the similar location to the comparison study.  After coil embolization of the targeted vasculature, there is no further filling on contrast injection.  IMPRESSION: Status post mesenteric angiogram with sub selection of replaced left hepatic artery via the left gastric artery, with coil embolization of segment 4 branches of the left hepatic artery, which occluded flow to the target vasculature. No further flow to the targeted vessels identified on the completion angiogram.  Signed,  Dulcy Fanny. Earleen Newport, DO  [facility]  [facility]   Electronically Signed   By: Corrie Mckusick D.O.   On: 04/18/2015 17:22   Ir Angiogram Selective Each Additional Vessel  04/18/2015   CLINICAL DATA:  29 year old female with a history of recent percutaneous liver biopsy for concern of cirrhosis diagnosis.  The patient subsequently developed intra abdominal hemorrhage and underwent embolization of left hepatic artery branches on the same day. Given that the patient is coagulopathic and is hypothermic, it seems as though she has continued to bleed through the particle embolization, with low hemoglobin and downward trend of her blood pressure.  She presents for mesenteric angiogram and potential further embolization.  EXAM: MESENTERIC ANGIOGRAM, WITH ANGIOGRAM OF THE CELIAC ARTERY, LEFT GASTRIC ARTERY, LEFT HEPATIC ARTERY, AND SEGMENTAL BRANCHES OF THE LEFT HEPATIC ARTERY.  COIL EMBOLIZATION OF INFERIOR SEGMENT LEFT HEPATIC ARTERY.  ANESTHESIA/SEDATION: The patient is intubated, with management from respiratory therapy team.  2.0 Mg IV Versed; fentanyl infusion  Contrast Volume: 120 cc Omni 300  Additional Medications: 5 under mcg nitroglycerin, 2.5 mg intra-arterial verapamil  FLUOROSCOPY TIME:  29 minutes, 12 seconds Minutes.  PROCEDURE: The  procedure, risks, benefits, and alternatives were explained to the patient's family. Questions regarding the procedure were encouraged and answered. The patient understands and consents to the procedure.  The indwelling right common femoral artery sheath was prepped and draped in the usual sterile fashion, as well as the right groin, which was prepped with Betadine in a sterile fashion, and a sterile drape was applied covering the operative field.  Wire was advanced through the indwelling sheath, which was then exchanged over the wire for a 35 cm 5 French sheath. This was attached  to a pressure bag with heparinized saline.  A 5 [place] Mickelson catheter was then used to select the celiac artery and a celiac artery angiogram was performed with power injection.  The Mickelson catheter and a Glidewire were used to select the left gastric artery, with the [facility] the catheter advanced over the Glidewire into the proximal left gastric artery.  Left gastric artery angiogram was performed.  100 mcg of nitroglycerin were then infused intra arterial.  A renegade STC catheter was then advanced over a 014 fathom wire through the left gastric artery to select the the replaced left hepatic artery, with sub selection of the inferior segments of the left hepatic artery, which were visualized to contribute to ongoing contrast pooling.  Sub selection of distal branches was performed with coil embolization with 2 mm and 3 mm diameter interlock coils.  Repeat angiogram of the segments demonstrated on going filling of the target vasculature, and further coil embolization of inferior segment of the left hepatic artery was required.  Gel-Foam slurry was infused after coil embolization of the downgoing/inferior segment.  Repeat angiogram demonstrated no further filling of the target vasculature.  No complications were encountered.  No significant blood loss was encountered during the procedure.  Patient remained hemodynamically stable  through the procedure.  COMPLICATIONS: None.  FINDINGS: Angiogram of the celiac artery demonstrates variant anatomy of the hepatic vasculature, seen on prior study. Replaced left hepatic artery from left gastric artery again visualized, which was selected.  Angiogram demonstrates vessel spasm with attenuation of multiple distal branches, compatible with the patient's current hypovolemic shock.  Selected angiography of segments of the left hepatic artery, segment 4 branch, demonstrate ongoing contrast pooling in the similar location to the comparison study.  After coil embolization of the targeted vasculature, there is no further filling on contrast injection.  IMPRESSION: Status post mesenteric angiogram with sub selection of replaced left hepatic artery via the left gastric artery, with coil embolization of segment 4 branches of the left hepatic artery, which occluded flow to the target vasculature. No further flow to the targeted vessels identified on the completion angiogram.  Signed,  Dulcy Fanny. Earleen Newport, DO  [facility]  [facility]   Electronically Signed   By: Corrie Mckusick D.O.   On: 04/18/2015 17:22   Ir Angiogram Selective Each Additional Vessel  04/18/2015   INDICATION: History of cirrhosis of uncertain etiology post ultrasound-guided liver biopsy on 04/17/2015, now with hemodynamic instability and postprocedural CT scan demonstrated active extravasation from the left lobe of the liver. Please perform mesenteric arteriogram and possible embolization.  Patient also with acute renal insufficiency. Please perform ultrasound fluoroscopic guided temporary dialysis catheter placement.  EXAM: 1. ULTRASOUND GUIDANCE FOR ARTERIAL ACCESS 2. CELIAC ARTERIOGRAM 3. SELECTIVE LEFT GASTRIC ARTERIOGRAM 4. SELECTIVE ACCESSORY LEFT HEPATIC ARTERIOGRAM 5. SELECTIVE ARTERIOGRAM OF THE ANTERIOR DIVISION OF THE ACCESSORY LEFT HEPATIC ARTERY AND PERCUTANEOUS PARTICLE EMBOLIZATION 6.  ULTRASOUND AND FLUOROSCOPIC GUIDED LEFT COMMON FEMORAL VEIN APPROACH TEMPORARY DIALYSIS CATHETER.  COMPARISON:  CT the abdomen pelvis - 04/18/2015; ultrasound-guided random liver biopsy - 04/17/2015  MEDICATIONS: Patient intubated and sedated with continuous monitoring via the ICU staff.  CONTRAST:  68m OMNIPAQUE IOHEXOL 300 MG/ML SOLN, 555mOMNIPAQUE IOHEXOL 300 MG/ML SOLN  FLUOROSCOPY TIME:  1 minutes 42 seconds (1,2,025Gy)  COMPLICATIONS: None immediate  TECHNIQUE: Informed written consent was obtained from the patient's father after a discussion of the risks, benefits and alternatives to treatment. Questions regarding the procedure were encouraged and  answered. A timeout was performed prior to the initiation of the procedure.  The bilateral groins were prepped and draped in the usual sterile fashion, and a sterile drape was applied covering the operative field. Maximum barrier sterile technique with sterile gowns and gloves were used for the procedure. A timeout was performed prior to the initiation of the procedure. Local anesthesia was provided with 1% lidocaine.  The right femoral head was marked fluoroscopically. Under ultrasound guidance, the right common femoral artery was accessed with a micropuncture kit after the overlying soft tissues were anesthetized with 1% lidocaine. An ultrasound image was saved for documentation purposes. The micropuncture sheath was exchanged for a 5 [place] vascular sheath over a Bentson wire. A closure arteriogram was performed through the side of the sheath confirming access within the right common femoral artery.  Over a Bentson wire, a Mickelson catheter was advanced to the level of the thoracic aorta where it was back bled and flushed. The catheter was then utilized to select the celiac artery and a celiac arteriogram was performed.  The Mickelson catheter was retracted to select the left gastric artery and a left gastric arteriogram was performed.  With the use of a  Fathom micro wire, a high-flow Renegade micro catheter was advanced into the accessory left hepatic artery and a selective accessory left hepatic arteriogram was performed.  Over an exchange length microwire a regular Renegade micro catheter was advanced into the anterior segmental branch of the accessory left hepatic artery and a selective arteriogram was performed.  Despite prolonged efforts, the micro catheter could not be advanced into the tiny branch vessel of the left hepatic artery supplying the area of persistent active extravasation. As such, particle embolization was performed of the anterior dictation of the left lateral hepatic artery with 700 to 900 micron Embospheres.  The micro catheter was retracted into the accessory left hepatic artery and a completion accessory left hepatic arteriogram was performed. All wires and catheters were removed from the patient. The right common femoral artery approach vascular sheath was secured at the right groin access site within interrupted suture and connected to a pressure bag.  Attention was now paid towards placement of the temporary dialysis catheter.  The left femoral head was marked fluoroscopically. Under direct ultrasound guidance, the left common femoral vein was accessed with a micropuncture kit. The track was dilated under intermittent fluoroscopic guidance ultimately allowing placement of a 20 cm non tunneled dialysis dialysis catheter with tip terminating within the distal aspect of the IVC. All lumens were noted to easily aspirate and flush peer and all lumens were flushed with appropriate heparin wells. Concern for dialysis catheter was secured in place within interrupted suture.  Dressings were applied. The patient tolerated the above procedure well without immediate postprocedural complication and remained hemodynamically stable throughout the procedure.  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  Selective left gastric and accessory left hepatic arteriograms demonstrates an ill-defined area of active extravasation arising from an anterior segmental branch of the medial segment of the left lobe of the liver correlating with prior biopsy site.  Diffuse vessel spasm was noted about this distal vessel and as such, sub selective arteriogram and coil embolization could not be performed. Rather the anterior segmental vascular distribution was particle embolized with Embospheres.  Completion arteriogram demonstrates complete stasis  of the anterior segmental branch of the accessory left hepatic artery with no additional areas of active extravasation.  Following ultrasound fluoroscopic guided placement, the temporary dialysis catheter tip terminates within the caudal aspect of the IVC.  IMPRESSION: 1. Technically successful particle embolization of the anterior division of the accessory left hepatic artery for active hepatic arterial extravasation. 2. Successful ultrasound fluoroscopic guided placement of a left common femoral vein approach temporary dialysis catheter with tip terminating within the caudal aspect of the IVC. The temporary dialysis catheter is ready for immediate use.   Electronically Signed   By: Sandi Mariscal M.D.   On: 04/18/2015 10:32   Ir Angiogram Selective Each Additional Vessel  04/18/2015   INDICATION: History of cirrhosis of uncertain etiology post ultrasound-guided liver biopsy on 04/17/2015, now with hemodynamic instability and postprocedural CT scan demonstrated active extravasation from the left lobe of the liver. Please perform mesenteric arteriogram and possible embolization.  Patient also with acute renal insufficiency. Please perform ultrasound fluoroscopic guided temporary dialysis catheter placement.  EXAM: 1. ULTRASOUND GUIDANCE FOR  ARTERIAL ACCESS 2. CELIAC ARTERIOGRAM 3. SELECTIVE LEFT GASTRIC ARTERIOGRAM 4. SELECTIVE ACCESSORY LEFT HEPATIC ARTERIOGRAM 5. SELECTIVE ARTERIOGRAM OF THE ANTERIOR DIVISION OF THE ACCESSORY LEFT HEPATIC ARTERY AND PERCUTANEOUS PARTICLE EMBOLIZATION 6. ULTRASOUND AND FLUOROSCOPIC GUIDED LEFT COMMON FEMORAL VEIN APPROACH TEMPORARY DIALYSIS CATHETER.  COMPARISON:  CT the abdomen pelvis - 04/18/2015; ultrasound-guided random liver biopsy - 04/17/2015  MEDICATIONS: Patient intubated and sedated with continuous monitoring via the ICU staff.  CONTRAST:  63m OMNIPAQUE IOHEXOL 300 MG/ML SOLN, 561mOMNIPAQUE IOHEXOL 300 MG/ML SOLN  FLUOROSCOPY TIME:  1 minutes 42 seconds (1,3,664Gy)  COMPLICATIONS: None immediate  TECHNIQUE: Informed written consent was obtained from the patient's father after a discussion of the risks, benefits and alternatives to treatment. Questions regarding the procedure were encouraged and answered. A timeout was performed prior to the initiation of the procedure.  The bilateral groins were prepped and draped in the usual sterile fashion, and a sterile drape was applied covering the operative field. Maximum barrier sterile technique with sterile gowns and gloves were used for the procedure. A timeout was performed prior to the initiation of the procedure. Local anesthesia was provided with 1% lidocaine.  The right femoral head was marked fluoroscopically. Under ultrasound guidance, the right common femoral artery was accessed with a micropuncture kit after the overlying soft tissues were anesthetized with 1% lidocaine. An ultrasound image was saved for documentation purposes. The micropuncture sheath was exchanged for a 5 [place] sheath over a Bentson wire. A closure arteriogram was performed through the side of the sheath confirming access within the right common femoral artery.  Over a Bentson wire, a Mickelson catheter was advanced to the level of the thoracic aorta where it was back bled  and flushed. The catheter was then utilized to select the celiac artery and a celiac arteriogram was performed.  The Mickelson catheter was retracted to select the left gastric artery and a left gastric arteriogram was performed.  With the use of a Fathom micro wire, a high-flow Renegade micro catheter was advanced into the accessory left hepatic artery and a selective accessory left hepatic arteriogram was performed.  Over an exchange length microwire a regular Renegade micro catheter was advanced into the anterior segmental branch of the accessory left hepatic artery and a selective arteriogram was performed.  Despite prolonged efforts, the micro catheter could not be advanced into the tiny branch vessel of the left hepatic artery supplying the  area of persistent active extravasation. As such, particle embolization was performed of the anterior dictation of the left lateral hepatic artery with 700 to 900 micron Embospheres.  The micro catheter was retracted into the accessory left hepatic artery and a completion accessory left hepatic arteriogram was performed. All wires and catheters were removed from the patient. The right common femoral artery approach vascular sheath was secured at the right groin access site within interrupted suture and connected to a pressure bag.  Attention was now paid towards placement of the temporary dialysis catheter.  The left femoral head was marked fluoroscopically. Under direct ultrasound guidance, the left common femoral vein was accessed with a micropuncture kit. The track was dilated under intermittent fluoroscopic guidance ultimately allowing placement of a 20 cm non tunneled dialysis dialysis catheter with tip terminating within the distal aspect of the IVC. All lumens were noted to easily aspirate and flush peer and all lumens were flushed with appropriate heparin wells. Concern for dialysis catheter was secured in place within interrupted suture.  Dressings were applied.  The patient tolerated the above procedure well without immediate postprocedural complication and remained hemodynamically stable throughout the procedure.  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  Selective left gastric and accessory left hepatic arteriograms demonstrates an ill-defined area of active extravasation arising from an anterior segmental branch of the medial segment of the left lobe of the liver correlating with prior biopsy site.  Diffuse vessel spasm was noted about this distal vessel and as such, sub selective arteriogram and coil embolization could not be performed. Rather the anterior segmental vascular distribution was particle embolized with Embospheres.  Completion arteriogram demonstrates complete stasis of the anterior segmental branch of the accessory left hepatic artery with no additional areas of active extravasation.  Following ultrasound fluoroscopic guided placement, the temporary dialysis catheter tip terminates within the caudal aspect of the IVC.  IMPRESSION: 1. Technically successful particle embolization of the anterior division of the accessory left hepatic artery for active hepatic arterial extravasation. 2. Successful ultrasound fluoroscopic guided placement of a left common femoral vein approach temporary dialysis catheter with tip terminating within the caudal aspect of the IVC. The temporary dialysis catheter is ready for immediate use.   Electronically Signed   By: Sandi Mariscal M.D.   On: 04/18/2015 10:32   US Biopsy  04/17/2015   CLINICAL DATA:  29 year old with sickle cell anemia and cirrhosis. Request for liver biopsy.  EXAM: ULTRASOUND GUIDED LIVER BIOPSY  Physician: Stephan Minister. Anselm Pancoast, MD  FLUOROSCOPY TIME:  None  MEDICATIONS: 3 mg Versed, 75 mcg fentanyl. A radiology nurse monitored the  patient for moderate sedation.  ANESTHESIA/SEDATION: Moderate sedation time: 20 minutes  PROCEDURE: The procedure was explained to the patient. The risks and benefits of the procedure were discussed and the patient's questions were addressed. Informed consent was obtained from the patient. The liver was evaluated with ultrasound. Left hepatic lobe was selected for biopsy. The anterior abdomen was prepped and draped in sterile fashion. 1% lidocaine was used for local anesthetic. Using ultrasound guidance, 17 gauge needle was directed into the left hepatic lobe. Three core biopsies were obtained with an 18 gauge core device. The tract was embolized with Gel-Foam. Bandage placed over the puncture site.  FINDINGS: The liver has a nodular contour. Small amount of ascites along the  right side of the liver. Left hepatic lobe was selected for biopsy because there was no adjacent ascites. No evidence for bleeding or hematoma following the core biopsies.  Estimated blood loss: Minimal  COMPLICATIONS: None  IMPRESSION: Ultrasound-guided core biopsies of the left hepatic lobe.   Electronically Signed   By: Markus Daft M.D.   On: 04/17/2015 13:02   Ir Fluoro Guide Cv Line Left  04/18/2015   INDICATION: History of cirrhosis of uncertain etiology post ultrasound-guided liver biopsy on 04/17/2015, now with hemodynamic instability and postprocedural CT scan demonstrated active extravasation from the left lobe of the liver. Please perform mesenteric arteriogram and possible embolization.  Patient also with acute renal insufficiency. Please perform ultrasound fluoroscopic guided temporary dialysis catheter placement.  EXAM: 1. ULTRASOUND GUIDANCE FOR ARTERIAL ACCESS 2. CELIAC ARTERIOGRAM 3. SELECTIVE LEFT GASTRIC ARTERIOGRAM 4. SELECTIVE ACCESSORY LEFT HEPATIC ARTERIOGRAM 5. SELECTIVE ARTERIOGRAM OF THE ANTERIOR DIVISION OF THE ACCESSORY LEFT HEPATIC ARTERY AND PERCUTANEOUS PARTICLE EMBOLIZATION 6. ULTRASOUND AND FLUOROSCOPIC GUIDED  LEFT COMMON FEMORAL VEIN APPROACH TEMPORARY DIALYSIS CATHETER.  COMPARISON:  CT the abdomen pelvis - 04/18/2015; ultrasound-guided random liver biopsy - 04/17/2015  MEDICATIONS: Patient intubated and sedated with continuous monitoring via the ICU staff.  CONTRAST:  37m OMNIPAQUE IOHEXOL 300 MG/ML SOLN, 559mOMNIPAQUE IOHEXOL 300 MG/ML SOLN  FLUOROSCOPY TIME:  1 minutes 42 seconds (1,8,413Gy)  COMPLICATIONS: None immediate  TECHNIQUE: Informed written consent was obtained from the patient's father after a discussion of the risks, benefits and alternatives to treatment. Questions regarding the procedure were encouraged and answered. A timeout was performed prior to the initiation of the procedure.  The bilateral groins were prepped and draped in the usual sterile fashion, and a sterile drape was applied covering the operative field. Maximum barrier sterile technique with sterile gowns and gloves were used for the procedure. A timeout was performed prior to the initiation of the procedure. Local anesthesia was provided with 1% lidocaine.  The right femoral head was marked fluoroscopically. Under ultrasound guidance, the right common femoral artery was accessed with a micropuncture kit after the overlying soft tissues were anesthetized with 1% lidocaine. An ultrasound image was saved for documentation purposes. The micropuncture sheath was exchanged for a 5 [place] sheath over a Bentson wire. A closure arteriogram was performed through the side of the sheath confirming access within the right common femoral artery.  Over a Bentson wire, a Mickelson catheter was advanced to the level of the thoracic aorta where it was back bled and flushed. The catheter was then utilized to select the celiac artery and a celiac arteriogram was performed.  The Mickelson catheter was retracted to select the left gastric artery and a left gastric arteriogram was performed.  With the use of a Fathom micro wire, a high-flow Renegade  micro catheter was advanced into the accessory left hepatic artery and a selective accessory left hepatic arteriogram was performed.  Over an exchange length microwire a regular Renegade micro catheter was advanced into the anterior segmental branch of the accessory left hepatic artery and a selective arteriogram was performed.  Despite prolonged efforts, the micro catheter could not be advanced into the tiny branch vessel of the left hepatic artery supplying the area of persistent active extravasation. As such, particle embolization was performed of the anterior dictation of the left lateral hepatic artery with 700 to 900 micron Embospheres.  The micro catheter was retracted into the accessory left hepatic artery and a completion accessory left hepatic arteriogram was performed.  All wires and catheters were removed from the patient. The right common femoral artery approach vascular sheath was secured at the right groin access site within interrupted suture and connected to a pressure bag.  Attention was now paid towards placement of the temporary dialysis catheter.  The left femoral head was marked fluoroscopically. Under direct ultrasound guidance, the left common femoral vein was accessed with a micropuncture kit. The track was dilated under intermittent fluoroscopic guidance ultimately allowing placement of a 20 cm non tunneled dialysis dialysis catheter with tip terminating within the distal aspect of the IVC. All lumens were noted to easily aspirate and flush peer and all lumens were flushed with appropriate heparin wells. Concern for dialysis catheter was secured in place within interrupted suture.  Dressings were applied. The patient tolerated the above procedure well without immediate postprocedural complication and remained hemodynamically stable throughout the procedure.  FINDINGS: Celiac arteriogram confirms the presence of an accessory left hepatic artery supplying the lateral segment of the left lobe  of the liver arising from the left gastric artery as was demonstrated on preceding abdominal CT.  Selective left gastric and accessory left hepatic arteriograms demonstrates an ill-defined area of active extravasation arising from an anterior segmental branch of the medial segment of the left lobe of the liver correlating with prior biopsy site.  Diffuse vessel spasm was noted about this distal vessel and as such, sub selective arteriogram and coil embolization could not be performed. Rather the anterior segmental vascular distribution was particle embolized with Embospheres.  Completion arteriogram demonstrates complete stasis of the anterior segmental branch of the accessory left hepatic artery with no additional areas of active extravasation.  Following ultrasound fluoroscopic guided placement, the temporary dialysis catheter tip terminates within the caudal aspect of the IVC.  IMPRESSION: 1. Technically successful particle embolization of the anterior division of the accessory left hepatic artery for active hepatic arterial extravasation. 2. Successful ultrasound fluoroscopic guided placement of a left common femoral vein approach temporary dialysis catheter with tip terminating within the caudal aspect of the IVC. The temporary dialysis catheter is ready for immediate use.   Electronically Signed   By: Sandi Mariscal M.D.   On: 04/18/2015 10:32   Ir US Guide Vasc Access Left  04/18/2015   INDICATION: History of cirrhosis of uncertain etiology post ultrasound-guided liver biopsy on 04/17/2015, now with hemodynamic instability and postprocedural CT scan demonstrated active extravasation from the left lobe of the liver. Please perform mesenteric arteriogram and possible embolization.  Patient also with acute renal insufficiency. Please perform ultrasound fluoroscopic guided temporary dialysis catheter placement.  EXAM: 1. ULTRASOUND GUIDANCE FOR ARTERIAL ACCESS 2. CELIAC ARTERIOGRAM 3. SELECTIVE LEFT GASTRIC  ARTERIOGRAM 4. SELECTIVE ACCESSORY LEFT HEPATIC ARTERIOGRAM 5. SELECTIVE ARTERIOGRAM OF THE ANTERIOR DIVISION OF THE ACCESSORY LEFT HEPATIC ARTERY AND PERCUTANEOUS PARTICLE EMBOLIZATION 6. ULTRASOUND AND FLUOROSCOPIC GUIDED LEFT COMMON FEMORAL VEIN APPROACH TEMPORARY DIALYSIS CATHETER.  COMPARISON:  CT the abdomen pelvis - 04/18/2015; ultrasound-guided random liver biopsy - 04/17/2015  MEDICATIONS: Patient intubated and sedated with continuous monitoring via the ICU staff.  CONTRAST:  36m OMNIPAQUE IOHEXOL 300 MG/ML SOLN, 583mOMNIPAQUE IOHEXOL 300 MG/ML SOLN  FLUOROSCOPY TIME:  1 minutes 42 seconds (1,1,031Gy)  COMPLICATIONS: None immediate  TECHNIQUE: Informed written consent was obtained from the patient's father after a discussion of the risks, benefits and alternatives to treatment. Questions regarding the procedure were encouraged and answered. A timeout was performed prior to the initiation of the procedure.  The bilateral groins were prepped  and draped in the usual sterile fashion, and a sterile drape was applied covering the operative field. Maximum barrier sterile technique with sterile gowns and gloves were used for the procedure. A timeout was performed prior to the initiation of the procedure. Local anesthesia was provided with 1% lidocaine.  The right femoral head was marked fluoroscopically. Under ultrasound guidance, the right common femoral artery was accessed with a micropuncture kit after the overlying soft tissues were anesthetized with 1% lidocaine. An ultrasound image was saved for documentation purposes. The micropuncture sheath was exchanged for a 5 [place] vascular sheath over a Bentson wire. A closure arteriogram was performed through the side of the sheath confirming access within the right common femoral artery.  Over a Bentson wire, a Mickelson catheter was advanced to the level of the thoracic aorta where it was back bled and flushed. The catheter was then utilized to select the celiac  artery and a celiac arteriogram was performed.  The Mickelson catheter was retracted to select the left gastric artery and a left gastric arteriogram was performed.  With the use of a Fathom micro wire, a high-flow Renegade micro catheter was advanced into the accessory left hepatic artery and a selective accessory left hepatic arteriogram was performed.  Over an exchange length microwire a regular Renegade micro catheter was advanced into the anterior segmental branch of the accessory left hepatic artery and a selective arteriogram was performed.  Despite prolonged efforts, the micro catheter could not be advanced into the tiny branch vessel of the left hepatic artery supplying the area of persistent active extravasation. As such, particle embolization was performed of the anterior dictation of the left lateral hepatic artery with 700 to 900 micron Embospheres.  The micro catheter was retracted into the accessory left hepatic artery and a completion accessory left hepatic arteriogram was performed. All wires and catheters were removed from the patient. The right common femoral artery approach vascular sheath was secured at the right groin access site within interrupted suture and connected to a pressure bag.  Attention was now paid towards placement of the temporary dialysis catheter.  The left femoral head was marked fluoroscopically. Under direct ultrasound guidance, the left common femoral vein was accessed with a micropuncture kit. The track was dilated under intermittent fluoroscopic guidance ultimately allowing placement of a 20 cm non tunneled dialysis dialysis catheter with tip terminating within the distal aspect of the IVC. All lumens were noted to easily aspirate and flush peer and all lumens were flushed with appropriate heparin wells. Concern for dialysis catheter was secured in place within interrupted suture.  Dressings were applied. The patient tolerated the above procedure well without immediate  postprocedural complication and remained hemodynamically stable throughout the procedure.  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  Selective left gastric and accessory left hepatic arteriograms demonstrates an ill-defined area of active extravasation arising from an anterior segmental branch of the medial segment of the left lobe of the liver correlating with prior biopsy site.  Diffuse vessel spasm was noted about this distal vessel and as such, sub selective arteriogram and coil embolization could not be performed. Rather the anterior segmental vascular distribution was particle embolized with Embospheres.  Completion arteriogram demonstrates complete stasis of the anterior segmental branch of the accessory left hepatic artery with no additional areas of active extravasation.  Following ultrasound fluoroscopic guided placement, the temporary dialysis catheter tip terminates within the caudal aspect of the IVC.  IMPRESSION: 1. Technically successful particle embolization of the anterior division of the accessory left hepatic artery for active hepatic arterial extravasation. 2. Successful ultrasound fluoroscopic guided placement of a left common femoral vein approach temporary dialysis catheter with tip terminating within the caudal aspect of the IVC. The temporary dialysis catheter is ready for immediate use.   Electronically Signed   By: Sandi Mariscal M.D.   On: 04/18/2015 10:32   Ir US Guide Vasc Access Right  04/18/2015   INDICATION: History of cirrhosis of uncertain etiology post ultrasound-guided liver biopsy on 04/17/2015, now with hemodynamic instability and postprocedural CT scan demonstrated active extravasation from the left lobe of the liver. Please perform mesenteric arteriogram and possible embolization.  Patient  also with acute renal insufficiency. Please perform ultrasound fluoroscopic guided temporary dialysis catheter placement.  EXAM: 1. ULTRASOUND GUIDANCE FOR ARTERIAL ACCESS 2. CELIAC ARTERIOGRAM 3. SELECTIVE LEFT GASTRIC ARTERIOGRAM 4. SELECTIVE ACCESSORY LEFT HEPATIC ARTERIOGRAM 5. SELECTIVE ARTERIOGRAM OF THE ANTERIOR DIVISION OF THE ACCESSORY LEFT HEPATIC ARTERY AND PERCUTANEOUS PARTICLE EMBOLIZATION 6. ULTRASOUND AND FLUOROSCOPIC GUIDED LEFT COMMON FEMORAL VEIN APPROACH TEMPORARY DIALYSIS CATHETER.  COMPARISON:  CT the abdomen pelvis - 04/18/2015; ultrasound-guided random liver biopsy - 04/17/2015  MEDICATIONS: Patient intubated and sedated with continuous monitoring via the ICU staff.  CONTRAST:  66m OMNIPAQUE IOHEXOL 300 MG/ML SOLN, 544mOMNIPAQUE IOHEXOL 300 MG/ML SOLN  FLUOROSCOPY TIME:  1 minutes 42 seconds (1,9,678Gy)  COMPLICATIONS: None immediate  TECHNIQUE: Informed written consent was obtained from the patient's father after a discussion of the risks, benefits and alternatives to treatment. Questions regarding the procedure were encouraged and answered. A timeout was performed prior to the initiation of the procedure.  The bilateral groins were prepped and draped in the usual sterile fashion, and a sterile drape was applied covering the operative field. Maximum barrier sterile technique with sterile gowns and gloves were used for the procedure. A timeout was performed prior to the initiation of the procedure. Local anesthesia was provided with 1% lidocaine.  The right femoral head was marked fluoroscopically. Under ultrasound guidance, the right common femoral artery was accessed with a micropuncture kit after the overlying soft tissues were anesthetized with 1% lidocaine. An ultrasound image was saved for documentation purposes. The micropuncture sheath was exchanged for a 5 Fr[place]ascular sheath over a Bentson wire. A closure arteriogram was performed through the side of the sheath confirming access  within the right common femoral artery.  Over a Bentson wire, a Mickelson catheter was advanced to the level of the thoracic aorta where it was back bled and flushed. The catheter was then utilized to select the celiac artery and a celiac arteriogram was performed.  The Mickelson catheter was retracted to select the left gastric artery and a left gastric arteriogram was performed.  With the use of a Fathom micro wire, a high-flow Renegade micro catheter was advanced into the accessory left hepatic artery and a selective accessory left hepatic arteriogram was performed.  Over an exchange length microwire a regular Renegade micro catheter was advanced into the anterior segmental branch of the accessory left hepatic artery and a selective arteriogram was performed.  Despite prolonged efforts, the micro catheter could not be advanced into the tiny branch vessel of the left hepatic artery supplying the area of persistent active extravasation. As such, particle embolization was performed of the anterior dictation of the left lateral  hepatic artery with 700 to 900 micron Embospheres.  The micro catheter was retracted into the accessory left hepatic artery and a completion accessory left hepatic arteriogram was performed. All wires and catheters were removed from the patient. The right common femoral artery approach vascular sheath was secured at the right groin access site within interrupted suture and connected to a pressure bag.  Attention was now paid towards placement of the temporary dialysis catheter.  The left femoral head was marked fluoroscopically. Under direct ultrasound guidance, the left common femoral vein was accessed with a micropuncture kit. The track was dilated under intermittent fluoroscopic guidance ultimately allowing placement of a 20 cm non tunneled dialysis dialysis catheter with tip terminating within the distal aspect of the IVC. All lumens were noted to easily aspirate and flush peer and all  lumens were flushed with appropriate heparin wells. Concern for dialysis catheter was secured in place within interrupted suture.  Dressings were applied. The patient tolerated the above procedure well without immediate postprocedural complication and remained hemodynamically stable throughout the procedure.  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  Selective left gastric and accessory left hepatic arteriograms demonstrates an ill-defined area of active extravasation arising from an anterior segmental branch of the medial segment of the left lobe of the liver correlating with prior biopsy site.  Diffuse vessel spasm was noted about this distal vessel and as such, sub selective arteriogram and coil embolization could not be performed. Rather the anterior segmental vascular distribution was particle embolized with Embospheres.  Completion arteriogram demonstrates complete stasis of the anterior segmental branch of the accessory left hepatic artery with no additional areas of active extravasation.  Following ultrasound fluoroscopic guided placement, the temporary dialysis catheter tip terminates within the caudal aspect of the IVC.  IMPRESSION: 1. Technically successful particle embolization of the anterior division of the accessory left hepatic artery for active hepatic arterial extravasation. 2. Successful ultrasound fluoroscopic guided placement of a left common femoral vein approach temporary dialysis catheter with tip terminating within the caudal aspect of the IVC. The temporary dialysis catheter is ready for immediate use.   Electronically Signed   By: Sandi Mariscal M.D.   On: 04/18/2015 10:32   Dg Chest Port 1 View  04/18/2015   CLINICAL DATA:  Endotracheal tube and central line placements  EXAM: PORTABLE CHEST - 1 VIEW   COMPARISON:  04/17/2015  FINDINGS: The endotracheal tube tip is 1.5 cm above the carina. The nasogastric tube extends into the stomach. There is a left jugular central line with tip in the cavoatrial junction. There is no pneumothorax. There is no dense airspace consolidation. There is no large effusion.  IMPRESSION: Support equipment appears satisfactorily positioned.   Electronically Signed   By: Andreas Newport M.D.   On: 04/18/2015 00:40   Dg Chest Port 1 View  04/17/2015   CLINICAL DATA:  Shortness of Breath  EXAM: PORTABLE CHEST - 1 VIEW  COMPARISON:  April 03, 2015  FINDINGS: Degree of inspiration is shallow. There is no edema or consolidation. Heart size and pulmonary vascularity are within normal limits given the degree of shallow inspiratory effort. No adenopathy. No bone lesions. No pneumothorax.  IMPRESSION: No edema or consolidation.  Note shallow degree of inspiration.   Electronically Signed   By: Lowella Grip III M.D.  On: 04/17/2015 20:21   Dg Abd Portable 1v  04/17/2015   CLINICAL DATA:  29 year old female abdominal pain and distention x2 weeks.  EXAM: PORTABLE ABDOMEN - 1 VIEW  COMPARISON:  Abdominal ultrasound dated 04/08/2015 and MRI dated 04/12/2015.  FINDINGS: This is a nonspecific bowel gas pattern. Moderate stool noted throughout the colon. Right upper quadrant cholecystectomy clips. The osseous structures appear unremarkable.  IMPRESSION: Nonspecific bowel gas pattern without definite evidence of obstruction.   Electronically Signed   By: Anner Crete M.D.   On: 04/17/2015 20:26   Ir Embo Arterial Not [place]  04/18/2015   CLINICAL DATA:  29 year old female with a history of recent percutaneous liver biopsy for concern of cirrhosis diagnosis.  The patient subsequently developed intra abdominal hemorrhage and underwent embolization of left hepatic artery branches on the same day. Given that the patient is coagulopathic and is hypothermic, it  seems as though she has continued to bleed through the particle embolization, with low hemoglobin and downward trend of her blood pressure.  She presents for mesenteric angiogram and potential further embolization.  EXAM: MESENTERIC ANGIOGRAM, WITH ANGIOGRAM OF THE CELIAC ARTERY, LEFT GASTRIC ARTERY, LEFT HEPATIC ARTERY, AND SEGMENTAL BRANCHES OF THE LEFT HEPATIC ARTERY.  COIL EMBOLIZATION OF INFERIOR SEGMENT LEFT HEPATIC ARTERY.  ANESTHESIA/SEDATION: The patient is intubated, with management from respiratory therapy team.  2.0 Mg IV Versed; fentanyl infusion  Contrast Volume: 120 cc Omni 300  Additional Medications: 5 under mcg nitroglycerin, 2.5 mg intra-arterial verapamil  FLUOROSCOPY TIME:  29 minutes, 12 seconds Minutes.  PROCEDURE: The procedure, risks, benefits, and alternatives were explained to the patient's family. Questions regarding the procedure were encouraged and answered. The patient understands and consents to the procedure.  The indwelling right common femoral artery sheath was prepped and draped in the usual sterile fashion, as well as the right groin, which was prepped with Betadine in a sterile fashion, and a sterile drape was applied covering the operative field.  Wire was advanced through the indwelling sheath, which was then exchanged over the wire for a 35 cm 5 French sheath. This was attached to a pressure bag with heparinized saline.  A 5 [place] Mickelson catheter was then used to select the celiac artery and a celiac artery angiogram was performed with power injection.  The Mickelson catheter and a Glidewire were used to select the left gastric artery, with the [facility] the catheter advanced over the Glidewire into the proximal left gastric artery.  Left gastric artery angiogram was performed.  100 mcg of nitroglycerin were then infused intra arterial.  A renegade STC catheter was then advanced over a 014 fathom wire through the left gastric artery to select the the replaced left hepatic  artery, with sub selection of the inferior segments of the left hepatic artery, which were visualized to contribute to ongoing contrast pooling.  Sub selection of distal branches was performed with coil embolization with 2 mm and 3 mm diameter interlock coils.  Repeat angiogram of the segments demonstrated on going filling of the target vasculature, and further coil embolization of inferior segment of the left hepatic artery was required.  Gel-Foam slurry was infused after coil embolization of the downgoing/inferior segment.  Repeat angiogram demonstrated no further filling of the target vasculature.  No complications were encountered.  No significant blood loss was encountered during the procedure.  Patient remained hemodynamically stable through the procedure.  COMPLICATIONS: None.  FINDINGS: Angiogram of the celiac artery demonstrates variant anatomy of  the hepatic vasculature, seen on prior study. Replaced left hepatic artery from left gastric artery again visualized, which was selected.  Angiogram demonstrates vessel spasm with attenuation of multiple distal branches, compatible with the patient's current hypovolemic shock.  Selected angiography of segments of the left hepatic artery, segment 4 branch, demonstrate ongoing contrast pooling in the similar location to the comparison study.  After coil embolization of the targeted vasculature, there is no further filling on contrast injection.  IMPRESSION: Status post mesenteric angiogram with sub selection of replaced left hepatic artery via the left gastric artery, with coil embolization of segment 4 branches of the left hepatic artery, which occluded flow to the target vasculature. No further flow to the targeted vessels identified on the completion angiogram.  Signed,  Dulcy Fanny. Earleen Newport, DO  [facility]  [facility]   Electronically Signed   By: Corrie Mckusick D.O.   On: 04/18/2015 17:22   [place] Guide Roadmapping  04/18/2015   INDICATION: History of cirrhosis of uncertain etiology post ultrasound-guided liver biopsy on 04/17/2015, now with hemodynamic instability and postprocedural CT scan demonstrated active extravasation from the left lobe of the liver. Please perform mesenteric arteriogram and possible embolization.  Patient also with acute renal insufficiency. Please perform ultrasound fluoroscopic guided temporary dialysis catheter placement.  EXAM: 1. ULTRASOUND GUIDANCE FOR ARTERIAL ACCESS 2. CELIAC ARTERIOGRAM 3. SELECTIVE LEFT GASTRIC ARTERIOGRAM 4. SELECTIVE ACCESSORY LEFT HEPATIC ARTERIOGRAM 5. SELECTIVE ARTERIOGRAM OF THE ANTERIOR DIVISION OF THE ACCESSORY LEFT HEPATIC ARTERY AND PERCUTANEOUS PARTICLE EMBOLIZATION 6. ULTRASOUND AND FLUOROSCOPIC GUIDED LEFT COMMON FEMORAL VEIN APPROACH TEMPORARY DIALYSIS CATHETER.  COMPARISON:  CT the abdomen pelvis - 04/18/2015; ultrasound-guided random liver biopsy - 04/17/2015  MEDICATIONS: Patient intubated and sedated with continuous monitoring via the ICU staff.  CONTRAST:  14m OMNIPAQUE IOHEXOL 300 MG/ML SOLN, 58mOMNIPAQUE IOHEXOL 300 MG/ML SOLN  FLUOROSCOPY TIME:  1 minutes 42 seconds (1,2,423Gy)  COMPLICATIONS: None immediate  TECHNIQUE: Informed written consent was obtained from the patient's father after a discussion of the risks, benefits and alternatives to treatment. Questions regarding the procedure were encouraged and answered. A timeout was performed prior to the initiation of the procedure.  The bilateral groins were prepped and draped in the usual sterile fashion, and a sterile drape was applied covering the operative field. Maximum barrier sterile technique with sterile gowns and gloves were used for the procedure. A timeout was performed prior to the initiation of the procedure. Local anesthesia was provided with 1% lidocaine.  The right femoral head was marked fluoroscopically. Under ultrasound guidance, the right  common femoral artery was accessed with a micropuncture kit after the overlying soft tissues were anesthetized with 1% lidocaine. An ultrasound image was saved for documentation purposes. The micropuncture sheath was exchanged for a 5 Fr[place]ascular sheath over a Bentson wire. A closure arteriogram was performed through the side of the sheath confirming access within the right common femoral artery.  Over a Bentson wire, a Mickelson catheter was advanced to the level of the thoracic aorta where it was back bled and flushed. The catheter was then utilized to select the celiac artery and a celiac arteriogram was performed.  The Mickelson catheter was retracted to select the left gastric artery and a left gastric arteriogram was performed.  With the use of a Fathom micro wire, a high-flow Renegade micro catheter was advanced into the accessory left hepatic artery and a selective accessory left  hepatic arteriogram was performed.  Over an exchange length microwire a regular Renegade micro catheter was advanced into the anterior segmental branch of the accessory left hepatic artery and a selective arteriogram was performed.  Despite prolonged efforts, the micro catheter could not be advanced into the tiny branch vessel of the left hepatic artery supplying the area of persistent active extravasation. As such, particle embolization was performed of the anterior dictation of the left lateral hepatic artery with 700 to 900 micron Embospheres.  The micro catheter was retracted into the accessory left hepatic artery and a completion accessory left hepatic arteriogram was performed. All wires and catheters were removed from the patient. The right common femoral artery approach vascular sheath was secured at the right groin access site within interrupted suture and connected to a pressure bag.  Attention was now paid towards placement of the temporary dialysis catheter.  The left femoral head was marked fluoroscopically. Under  direct ultrasound guidance, the left common femoral vein was accessed with a micropuncture kit. The track was dilated under intermittent fluoroscopic guidance ultimately allowing placement of a 20 cm non tunneled dialysis dialysis catheter with tip terminating within the distal aspect of the IVC. All lumens were noted to easily aspirate and flush peer and all lumens were flushed with appropriate heparin wells. Concern for dialysis catheter was secured in place within interrupted suture.  Dressings were applied. The patient tolerated the above procedure well without immediate postprocedural complication and remained hemodynamically stable throughout the procedure.  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  Selective left gastric and accessory left hepatic arteriograms demonstrates an ill-defined area of active extravasation arising from an anterior segmental branch of the medial segment of the left lobe of the liver correlating with prior biopsy site.  Diffuse vessel spasm was noted about this distal vessel and as such, sub selective arteriogram and coil embolization could not be performed. Rather the anterior segmental vascular distribution was particle embolized with Embospheres.  Completion arteriogram demonstrates complete stasis of the anterior segmental branch of the accessory left hepatic artery with no additional areas of active extravasation.  Following ultrasound fluoroscopic guided placement, the temporary dialysis catheter tip terminates within the caudal aspect of the IVC.  IMPRESSION: 1. Technically successful particle embolization of the anterior division of the accessory left hepatic artery for active hepatic arterial extravasation. 2. Successful ultrasound fluoroscopic guided placement of a left common femoral  vein approach temporary dialysis catheter with tip terminating within the caudal aspect of the IVC. The temporary dialysis catheter is ready for immediate use.   Electronically Signed   By: Sandi Mariscal M.D.   On: 04/18/2015 10:32     Recent Labs Lab 04/12/15 1300 04/17/15 2244 04/18/15 1110  INR 1.28 2.09* 1.90*     A) Multiple bleeding sites Coagulopathich  Acidotic  Shock liver  ATN - cVVH  Plan Stat labs - dic panel, lactate, cbc, bmet, lft    Intervention Category Major Interventions:  Other:  Ruthanne Mcneish 04/18/2015, 6:50 PM

## 2015-04-18 NOTE — Progress Notes (Signed)
CRITICAL VALUE ALERT  Critical value received:  Potassium 7.2, total bilirubin 25.5  Date of notification:  04/18/2015  Time of notification:  0355  Critical value read back:Yes.    Nurse who received alert:  Drue Stageraroline Glover Capano, RN  MD notified (1st page):  Arsenio LoaderSommer, S  Time of first page: 0403  MD notified (2nd page):  Time of second page:  Responding MD:  Laural BenesSommer, S  Time MD responded:  0403  Pt down in IR at time of call for critical values. Values relayed to primary nurse caring for her and MD per protocol. NO further orders at this time.

## 2015-04-18 NOTE — Progress Notes (Signed)
Initial Nutrition Assessment  DOCUMENTATION CODES:  Not applicable  INTERVENTION:  If pt is to remain intubated >24 hours, recommend initiation of nutrition support.  Recommendations: Initiate Vital AF 1.2 @ 20 ml/hr via OGT and increase by 10 ml every 4 hours to goal rate of 55 ml/hr.   30 ml Prostat daily.    Tube feeding regimen provides 1684 kcal (100% of needs), 114 grams of protein, and 1071 ml of H2O.   RD to continue to monitor  NUTRITION DIAGNOSIS:  Inadequate oral intake related to inability to eat as evidenced by NPO status.  GOAL:  Patient will meet greater than or equal to 90% of their needs  MONITOR:  Vent status, Labs, Weight trends, Skin, I & O's  REASON FOR ASSESSMENT:  Ventilator    ASSESSMENT: 29 year old female with sickle cell anemia admitted for crisis 6/20. She was feeling better initially, however liver function continued to worsen. Had biopsy to further evaluate 6/28. Later than PM she had acute decompensation and acidosis.  Pt intubated 6/28. Pt with cirrhosis and liver mass.  Patient is currently intubated on ventilator support MV: 13.5 L/min Temp (24hrs), Avg:96.7 F (35.9 C), Min:93.7 F (34.3 C), Max:98.8 F (37.1 C)  Propofol:none  Labs reviewed: CBGs: 98-187 Elevated K, BUN, LFTs Low Creatinine  Height:  Ht Readings from Last 1 Encounters:  04/17/15 5\' 7"  (1.702 m)    Weight:  Wt Readings from Last 1 Encounters:  04/18/15 154 lb 8.7 oz (70.1 kg)    Ideal Body Weight:  61.4 kg  Wt Readings from Last 10 Encounters:  04/18/15 154 lb 8.7 oz (70.1 kg)  03/28/15 135 lb (61.236 kg)  01/17/15 131 lb 12.8 oz (59.784 kg)  11/23/14 136 lb (61.689 kg)  10/19/14 136 lb (61.689 kg)  07/31/14 134 lb (60.782 kg)  07/31/14 134 lb (60.782 kg)  06/21/14 133 lb (60.328 kg)  05/25/14 135 lb (61.236 kg)  05/01/14 132 lb (59.875 kg)    BMI:  Body mass index is 24.2 kg/(m^2).  Estimated Nutritional Needs:  Kcal:   1500-1700  Protein:  105-115g  Fluid:  1.6L/day    Skin:  Reviewed, no issues  Diet Order:  Diet NPO time specified  EDUCATION NEEDS:  No education needs identified at this time   Intake/Output Summary (Last 24 hours) at 04/18/15 1043 Last data filed at 04/18/15 1000  Gross per 24 hour  Intake 4637.41 ml  Output    540 ml  Net 4097.41 ml    Last BM:  6/28  Tilda FrancoLindsey Fitzpatrick [place], MS, RD, LDN Pager: [phone] After Hours Pager: [phone]

## 2015-04-18 NOTE — Progress Notes (Signed)
Cancel this number.  Wrong column.

## 2015-04-18 NOTE — Procedures (Signed)
Interventional Radiology Procedure Note  Procedure: Mesenteric angiogram, with coil embolization of left hepatic artery, inferior segment to hemorrhage. No flow through the segment at conclusion.  Complications: None Recommendations:  - Continue CCU care. - Maintain the right CFA for ICU (call with questions/concern)  Signed,  Yvone NeuJaime S. Loreta AveWagner, DO

## 2015-04-18 NOTE — Progress Notes (Signed)
PULMONARY / CRITICAL CARE MEDICINE   Name: Joan Martin MRN: [id number] DOB: 1986/06/27    ADMISSION DATE:  04/20/2015 CONSULTATION DATE:  04/17/2015  REFERRING MD :  Dr. Ashley Royalty  CHIEF COMPLAINT: sickle cell crisis  INITIAL PRESENTATION:  29 yo female admitted with sickle cell crisis.  Found to have liver mass.  Had Bx of on 6/28 >> developed hemoperitoneum, hemorrhagic shock, AKI, VDRF post-procedure.  STUDIES:  6/22 US abdomen >> cirrhosis, 9.6 cm mass Rt lobe of liver, ascites 6/23 MRI abdomen >> cirrhosis, macrolobulated portion of liver in Rt lobe 6/28 CT abd/pelvis >> hemoperitoneum  SIGNIFICANT EVENTS: 6/20 admitted for sickle cell crisis 6/28 liver biopsy >> hemoperitoneum; Embolization of Lt hepatic artery 6/29 start CRRT  SUBJECTIVE:  Received 2 unites PRBC, getting FFP.  Abd remains distended.  VITAL SIGNS: Temp:  [94.4 F (34.7 C)-97.7 F (36.5 C)] 96.8 F (36 C) (06/29 0700) Pulse Rate:  [77-111] 98 (06/29 0700) Resp:  [20-47] 28 (06/29 0700) BP: (86-158)/(19-94) 109/63 mmHg (06/29 0700) SpO2:  [89 %-100 %] 100 % (06/29 0700) Arterial Line BP: (129-144)/(61-67) 129/61 mmHg (06/29 0655) FiO2 (%):  [50 %-100 %] 50 % (06/29 0616) HEMODYNAMICS: CVP:  [9 mmHg-10 mmHg] 10 mmHg VENTILATOR SETTINGS: Vent Mode:  [-] PRVC FiO2 (%):  [50 %-100 %] 50 % Set Rate:  [28 bmp] 28 bmp Vt Set:  [490 mL] 490 mL PEEP:  [5 cmH20] 5 cmH20 Plateau Pressure:  [21 cmH20-24 cmH20] 21 cmH20 INTAKE / OUTPUT:  Intake/Output Summary (Last 24 hours) at 04/18/15 0741 Last data filed at 04/18/15 0630  Gross per 24 hour  Intake   2396 ml  Output   1140 ml  Net   1256 ml    PHYSICAL EXAMINATION: General: ill appearing Neuro: RASS -3 HEENT: ETT in place Cardiovascular: regular, no murmur Lungs: no wheeze Abdomen: firm, distended, absent bowel sounds Musculoskeletal: no edema Skin: no rashes  LABS:  CBC  Recent Labs Lab 04/17/15 0353 04/17/15 2037 04/18/15 0222   WBC 24.1* 30.5* 32.3*  HGB 6.9* 4.7* 5.5*  HCT 19.7* 13.8* 16.7*  PLT 152 89* 112*   Coag's  Recent Labs Lab 04/12/15 1300 04/17/15 2244  APTT 47*  --   INR 1.28 2.09*   BMET  Recent Labs Lab 04/17/15 0353 04/17/15 2037 04/18/15 0222  NA 136 138 138  K 4.1 6.4* 7.2*  CL 116* 117* 114*  CO2 15* 9* 9*  BUN 15 19 21*  CREATININE 0.32* 0.62 1.09*  GLUCOSE 79 20* 96   Electrolytes  Recent Labs Lab 04/17/15 0353 04/17/15 2037 04/18/15 0222  CALCIUM 9.1 9.2 7.5*  MG  --   --  2.2  PHOS  --   --  10.0*   Sepsis Markers  Recent Labs Lab 04/17/15 2143 04/18/15 0222  LATICACIDVEN 7.6* 8.0*  PROCALCITON  --  2.74   ABG  Recent Labs Lab 04/17/15 2051 04/17/15 2355 04/18/15 0553  PHART 7.071* 7.127* 7.196*  PCO2ART 27.2* 24.0* 23.6*  PO2ART 33.5* 449* 288*   Liver Enzymes  Recent Labs Lab 04/15/15 0611 04/17/15 0353 04/18/15 0222  AST 406* 347* 948*  ALT 112* 101* 183*  ALKPHOS 520* 506* 345*  BILITOT 33.0* 27.9* 25.5*  ALBUMIN 1.6* 1.5* 1.2*   Glucose  Recent Labs Lab 04/17/15 2347 04/18/15 0003 04/18/15 0139 04/18/15 0552 04/18/15 0617  GLUCAP 61* 135* 83 98 187*    Imaging Ct Head Wo Contrast  04/18/2015   CLINICAL DATA:  Evaluate for acute encephalopathy.  Hemorrhage after liver biopsy. History of sickle cell anemia, stroke.  EXAM: CT HEAD WITHOUT CONTRAST  TECHNIQUE: Contiguous axial images were obtained from the base of the skull through the vertex without intravenous contrast.  COMPARISON:  MRI of the brain January 22, 2015  FINDINGS: The ventricles and sulci are normal. No intraparenchymal hemorrhage, mass effect nor midline shift. No acute large vascular territory infarcts.  No abnormal extra-axial fluid collections. Basal cisterns are patent.  No skull fracture. The included ocular globes and orbital contents are non-suspicious. The mastoid aircells and included paranasal sinuses are well-aerated.  IMPRESSION: No acute intracranial  process; normal noncontrast CT head for age.   Electronically Signed   By: Awilda Metro M.D.   On: 04/18/2015 06:17   Ct Abdomen Pelvis W Contrast  04/18/2015   CLINICAL DATA:  Dropping hemoglobin after liver biopsy 04/17/2015.  EXAM: CT ABDOMEN AND PELVIS WITH CONTRAST  TECHNIQUE: Multidetector CT imaging of the abdomen and pelvis was performed using the standard protocol following bolus administration of intravenous contrast.  CONTRAST:  OMNIPAQUE IOHEXOL 300 MG/ML  SOLN  COMPARISON:  None.  FINDINGS: There is a large volume of peritoneal fluid. This probably is blood based on density of the fluid. There is active arterial hemorrhage during the scan, at the anterior surface of the left hepatic lobe. There are a few bubbles of peritoneal air, presumably from the percutaneous liver biopsy.  The liver is enlarged and cirrhotic. No intraparenchymal or subcapsular hemorrhage is evident. No focal liver lesion is evident. There is prior cholecystectomy.  There are unremarkable appearances of the pancreas and adrenals.  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 The spleen is either absent or very diminutive, consistent with the known sickle cell disease.  The abdominal aorta is normal in caliber. The inferior vena cava is collapsed, consistent with hypovolemia.  Bowel is unremarkable.  There is a Foley catheter and a rectal catheter.  Minimal atelectatic appearing posterior lung base opacities are present bilaterally, as well as trace pleural effusions.  IMPRESSION: Large volume hemoperitoneum with active arterial hemorrhage from the anterior surface of the left hepatic lobe. Recommend IR consultation to consider embolization. These results were called by telephone at the time of interpretation on 04/18/2015 at 1:54 am to charge nurse Verl Blalock, who verbally acknowledged these results. I provided him with the name and pager number of the interventionalist on call.  Enlarged, cirrhotic liver.  Bilateral renal  infarctions   Electronically Signed   By: Ellery Plunk M.D.   On: 04/18/2015 01:57   US Biopsy  04/17/2015   CLINICAL DATA:  29 year old with sickle cell anemia and cirrhosis. Request for liver biopsy.  EXAM: ULTRASOUND GUIDED LIVER BIOPSY  Physician: Rachelle Hora. Lowella Dandy, MD  FLUOROSCOPY TIME:  None  MEDICATIONS: 3 mg Versed, 75 mcg fentanyl. A radiology nurse monitored the patient for moderate sedation.  ANESTHESIA/SEDATION: Moderate sedation time: 20 minutes  PROCEDURE: The procedure was explained to the patient. The risks and benefits of the procedure were discussed and the patient's questions were addressed. Informed consent was obtained from the patient. The liver was evaluated with ultrasound. Left hepatic lobe was selected for biopsy. The anterior abdomen was prepped and draped in sterile fashion. 1% lidocaine was used for local anesthetic. Using ultrasound guidance, 17 gauge needle was directed into the left hepatic lobe. Three core biopsies were obtained with an 18 gauge core device. The tract was embolized with Gel-Foam. Bandage placed over the puncture site.  FINDINGS: The liver has  a nodular contour. Small amount of ascites along the right side of the liver. Left hepatic lobe was selected for biopsy because there was no adjacent ascites. No evidence for bleeding or hematoma following the core biopsies.  Estimated blood loss: Minimal  COMPLICATIONS: None  IMPRESSION: Ultrasound-guided core biopsies of the left hepatic lobe.   Electronically Signed   By: Richarda OverlieAdam  Henn M.D.   On: 04/17/2015 13:02   Dg Chest Port 1 View  04/18/2015   CLINICAL DATA:  Endotracheal tube and central line placements  EXAM: PORTABLE CHEST - 1 VIEW  COMPARISON:  04/17/2015  FINDINGS: The endotracheal tube tip is 1.5 cm above the carina. The nasogastric tube extends into the stomach. There is a left jugular central line with tip in the cavoatrial junction. There is no pneumothorax. There is no dense airspace consolidation. There is  no large effusion.  IMPRESSION: Support equipment appears satisfactorily positioned.   Electronically Signed   By: Ellery Plunkaniel R Mitchell M.D.   On: 04/18/2015 00:40   Dg Chest Port 1 View  04/17/2015   CLINICAL DATA:  Shortness of Breath  EXAM: PORTABLE CHEST - 1 VIEW  COMPARISON:  April 03, 2015  FINDINGS: Degree of inspiration is shallow. There is no edema or consolidation. Heart size and pulmonary vascularity are within normal limits given the degree of shallow inspiratory effort. No adenopathy. No bone lesions. No pneumothorax.  IMPRESSION: No edema or consolidation.  Note shallow degree of inspiration.   Electronically Signed   By: Bretta BangWilliam  Woodruff III M.D.   On: 04/17/2015 20:21   Dg Abd Portable 1v  04/17/2015   CLINICAL DATA:  29 year old female abdominal pain and distention x2 weeks.  EXAM: PORTABLE ABDOMEN - 1 VIEW  COMPARISON:  Abdominal ultrasound dated 04/06/2015 and MRI dated 04/12/2015.  FINDINGS: This is a nonspecific bowel gas pattern. Moderate stool noted throughout the colon. Right upper quadrant cholecystectomy clips. The osseous structures appear unremarkable.  IMPRESSION: Nonspecific bowel gas pattern without definite evidence of obstruction.   Electronically Signed   By: Elgie CollardArash  Radparvar M.D.   On: 04/17/2015 20:26     ASSESSMENT / PLAN:  PULMONARY ETT 6/28 > A:  Acute respiratory failure 2nd to hemorrhagic shock, encephalopathy, AKI, metabolic acidosis. Hx of asthma. P:   Full vent support F/u CXR, ABG Use brovana/pulmicort in place of advair for now PRN albuterol  CARDIOVASCULAR Rt IJ CVL 6/28 >> Lt femoral HD cath 6/28 >> A line 6/28 >> A:  Hemorrhagic shock 2nd to hemoperitoneum. P:  Continue IV fluids, blood product resuscitation  Goal CVP > 8 Pressors as needed to keep MAP > 65  RENAL A:   AKI 2nd to hemorrhagic shock >> baseline creatine 0.76 from 03/21/2015. Metabolic acidosis 2nd to lactic acidosis and renal failure. Hyperkalemia. P:   Continue HCO3 in  IV fluid for now To start CRRT 6/29 per renal F/u BMET, ABG  GASTROINTESTINAL A:   Cirrhosis of uncertain etiology >> s/p Lt hepatic lobe liver bx 6/28. Abdominal distention. P:   F/u liver bx results Protonix for SUP Check bladder pressure and monitor for abdominal compartment syndrome F/u LFT's Avoid acetaminophen in setting of liver disease  HEMATOLOGIC A:   Sickle cell crisis. Hemorrhagic shock 2nd to hemoperitoneum with consumption coagulopathy. P:  F/u CBC, coag's No indication for exchange transfusion at present Transfuse as needed for bleeding  INFECTIOUS A:   Shock >> most likely hemorrhagic; less concern for sepsis, but procalcitonin mildly elevated 6/29. P:   Day  2 vancomycin, aztreonam  BCx2 6/28 > UC 6/28 > Sputum 6/28 >  ENDOCRINE A:   Hypoglycemia. P:   Continue D10 in IV fluid for now CBG q1h for now  NEUROLOGIC A:    Acute metabolic encephalopathy with elevate ammonia level 6/28. Chronic headaches. P:   RASS goal -2 for now Add lactulose enema 6/29 Hold topamax for now   CC time 45 minutes.  Coralyn Helling, MD [facility] Pulmonary/Critical Care 04/18/2015, 7:59 AM Pager:  [phone] After 3pm call: [phone]

## 2015-04-18 NOTE — Progress Notes (Signed)
Rapid Response Event Note  Overview: Called to patient's room by charge RN on 3W.  Patient had liver biopsy earlier today and is now experiencing abdominal distension, hypotension and confusion  Initial Focused Assessment: Upon entering patient's room, patient SBP 90.  Tachypnea with RR 35-40.  Patient O2 sats 89-90% on room air.  Patient lethargic, follows commands but falls asleep easily.  Oriented to place and self, can not answer appropriately when questioned about pain, [facility]  Attempted to have patient point to where she is experiencing pain, but falls back asleep.  Grimaces to abdominal palpation in all quadrants.  Per patient's RN her abdominal distention has worsened since she has returned from her procedure.  Patient has received Dilaudid 0.5mg  IV x1 at 1655.  Patient's father at bedside, states that he feels like she has declined during the afternoon.  Interventions: Monitor shows sinus rhythm.  Patient placed on 2L O2 for low O2 sats.  MD notified by phone, orders placed for STAT labs and xray, and to transfuse 2u PRBC.    Event Summary: Patient transferred to SDU for closer monitoring.   Bonnielee Haffussell, Talen Poser Allen

## 2015-04-18 NOTE — Progress Notes (Addendum)
Joan Martin   DOB:1986-08-03   ZO#:[id number]   JWJ#:[id number]  Subjective: Pt unfortunately developed hemorrhagic shock after liver biopsy yesterday, status post left hepatic artery embolization. Her hemoglobin dropped to 4.6. She received 2 units of RBC last night. She was intubated, and started dialysis this morning for metabolic acidosis and hypercalcemia. She was seen by my partner Dr. Clelia Croft last night. She is receiving FFP this morning.    Objective:  Filed Vitals:   04/18/15 0900  BP: 109/49  Pulse: 88  Temp: 95.9 F (35.5 C)  Resp: 28    Body mass index is 24.2 kg/(m^2).  Intake/Output Summary (Last 24 hours) at 04/18/15 0919 Last data filed at 04/18/15 0900  Gross per 24 hour  Intake   3833 ml  Output   1290 ml  Net   2543 ml    Pt on mechanical ventilator, sedated  Sclerae (+) jaundice   No peripheral adenopathy  Lungs clear -- no rales or rhonchi  Heart regular rate and rhythm  Abdomen (+) very distended and firm   MSK no focal spinal tenderness, no peripheral edema  Neuro nonfocal   CBG (last 3)   Recent Labs  04/18/15 0552 04/18/15 0617 04/18/15 0741  GLUCAP 98 187* 110*     Labs:  Lab Results  Component Value Date   WBC 32.3* 04/18/2015   HGB 5.5* 04/18/2015   HCT 16.7* 04/18/2015   MCV 96.0 04/18/2015   PLT 112* 04/18/2015   NEUTROABS 22.7* 04/18/2015    @LASTCHEMISTRY @  Urine Studies No results for input(s): UHGB, CRYS in the last 72 hours.  Invalid input(s): UACOL, UAPR, USPG, UPH, UTP, UGL, UKET, UBIL, UNIT, UROB, Octavia, UEPI, UWBC, Ayesha Rumpf [place], [place], [place]  Basic Metabolic Panel:  Recent Labs Lab 04/14/15 1247 04/15/15 0611 04/17/15 0353 04/17/15 2037 04/18/15 0222  NA 137 135 136 138 138  K 4.2 4.1 4.1 6.4* 7.2*  CL 116* 116* 116* 117* 114*  CO2 13* 14* 15* 9* 9*  GLUCOSE 81 67 79 20* 96  BUN 14 15 15 19  21*  CREATININE <0.30* <0.30* 0.32* 0.62 1.09*  CALCIUM 8.8* 8.6* 9.1 9.2 7.5*  MG  --   --   --   --  2.2   PHOS  --   --   --   --  10.0*   GFR Estimated Creatinine Clearance: 73.4 mL/min (by C-G formula based on Cr of 1.09). Liver Function Tests:  Recent Labs Lab 04/13/15 0440 04/14/15 1247 04/15/15 0611 04/17/15 0353 04/18/15 0222  AST 301* 427* 406* 347* 948*  ALT 91* 123* 112* 101* 183*  ALKPHOS 487* 540* 520* 506* 345*  BILITOT 31.6* 40.6*  40.6* 33.0* 27.9* 25.5*  PROT 5.8* 6.0* 5.7* 5.6* 4.6*  ALBUMIN 1.7* 1.7* 1.6* 1.5* 1.2*   No results for input(s): LIPASE, AMYLASE in the last 168 hours.  Recent Labs Lab 04/17/15 2347  AMMONIA 141*   Coagulation profile  Recent Labs Lab 04/12/15 1300 04/17/15 2244  INR 1.28 2.09*    CBC:  Recent Labs Lab 04/12/15 0449 04/13/15 0440 04/15/15 0611 04/17/15 0353 04/17/15 2037 04/18/15 0222  WBC 16.3* 18.6* 23.8* 24.1* 30.5* 32.3*  NEUTROABS 9.6* 10.7* 8.8* 12.3*  --  22.7*  HGB 6.4* 7.6* 7.1* 6.9* 4.7* 5.5*  HCT 18.0* 22.2* 20.6* 19.7* 13.8* 16.7*  MCV 98.9 96.1 96.3 96.6 97.9 96.0  PLT 172 170 165 152 89* 112*   Cardiac Enzymes: No results for input(s): CKTOTAL, CKMB, CKMBINDEX, TROPONINI in the  last 168 hours. BNP: Invalid input(s): POCBNP CBG:  Recent Labs Lab 04/18/15 0003 04/18/15 0139 04/18/15 0552 04/18/15 0617 04/18/15 0741  GLUCAP 135* 83 98 187* 110*   D-Dimer No results for input(s): DDIMER in the last 72 hours. Hgb A1c No results for input(s): HGBA1C in the last 72 hours. Lipid Profile No results for input(s): CHOL, HDL, LDLCALC, TRIG, CHOLHDL, LDLDIRECT in the last 72 hours. Thyroid function studies No results for input(s): TSH, T4TOTAL, T3FREE, THYROIDAB in the last 72 hours.  Invalid input(s): FREET3 Anemia work up  [employer]  04/17/15 2236 04/18/15 0222  RETICCTPCT 19.5* 14.2*   Microbiology Recent Results (from the past 240 hour(s))  MRSA PCR Screening     Status: None   Collection Time: 04/17/15  8:36 PM  Result Value Ref Range Status   MRSA by PCR NEGATIVE NEGATIVE  Final    Comment:        The GeneXpert MRSA Assay (FDA approved for NASAL specimens only), is one component of a comprehensive MRSA colonization surveillance program. It is not intended to diagnose MRSA infection nor to guide or monitor treatment for MRSA infections.   Culture, blood (routine x 2)     Status: None (Preliminary result)   Collection Time: 04/17/15 10:37 PM  Result Value Ref Range Status   Specimen Description BLOOD BLOOD LEFT FOREARM  Final   Special Requests   Final    BOTTLES DRAWN AEROBIC ONLY 6CC Performed at [facility]    Culture PENDING  Incomplete   Report Status PENDING  Incomplete      Studies:  Ct Head Wo Contrast  04/18/2015   CLINICAL DATA:  Evaluate for acute encephalopathy. Hemorrhage after liver biopsy. History of sickle cell anemia, stroke.  EXAM: CT HEAD WITHOUT CONTRAST  TECHNIQUE: Contiguous axial images were obtained from the base of the skull through the vertex without intravenous contrast.  COMPARISON:  MRI of the brain January 22, 2015  FINDINGS: The ventricles and sulci are normal. No intraparenchymal hemorrhage, mass effect nor midline shift. No acute large vascular territory infarcts.  No abnormal extra-axial fluid collections. Basal cisterns are patent.  No skull fracture. The included ocular globes and orbital contents are non-suspicious. The mastoid aircells and included paranasal sinuses are well-aerated.  IMPRESSION: No acute intracranial process; normal noncontrast CT head for age.   Electronically Signed   By: Awilda Metro M.D.   On: 04/18/2015 06:17   Ct Abdomen Pelvis W Contrast  04/18/2015   CLINICAL DATA:  Dropping hemoglobin after liver biopsy 04/17/2015.  EXAM: CT ABDOMEN AND PELVIS WITH CONTRAST  TECHNIQUE: Multidetector CT imaging of the abdomen and pelvis was performed using the standard protocol following bolus administration of intravenous contrast.  CONTRAST:  OMNIPAQUE IOHEXOL 300 MG/ML  SOLN  COMPARISON:   None.  FINDINGS: There is a large volume of peritoneal fluid. This probably is blood based on density of the fluid. There is active arterial hemorrhage during the scan, at the anterior surface of the left hepatic lobe. There are a few bubbles of peritoneal air, presumably from the percutaneous liver biopsy.  The liver is enlarged and cirrhotic. No intraparenchymal or subcapsular hemorrhage is evident. No focal liver lesion is evident. There is prior cholecystectomy.  There are unremarkable appearances of the pancreas and adrenals.  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 The spleen is either absent or very diminutive, consistent with the known sickle cell disease.  The abdominal aorta is normal in caliber. The inferior vena  cava is collapsed, consistent with hypovolemia.  Bowel is unremarkable.  There is a Foley catheter and a rectal catheter.  Minimal atelectatic appearing posterior lung base opacities are present bilaterally, as well as trace pleural effusions.  IMPRESSION: Large volume hemoperitoneum with active arterial hemorrhage from the anterior surface of the left hepatic lobe. Recommend IR consultation to consider embolization. These results were called by telephone at the time of interpretation on 04/18/2015 at 1:54 am to charge nurse Verl BlalockHans Johnson, who verbally acknowledged these results. I provided him with the name and pager number of the interventionalist on call.  Enlarged, cirrhotic liver.  Bilateral renal infarctions   Electronically Signed   By: Ellery Plunkaniel R Mitchell M.D.   On: 04/18/2015 01:57   [place] Biopsy  04/17/2015   CLINICAL DATA:  29 year old with sickle cell anemia and cirrhosis. Request for liver biopsy.  EXAM: ULTRASOUND GUIDED LIVER BIOPSY  Physician: Rachelle HoraAdam R. Lowella DandyHenn, MD  FLUOROSCOPY TIME:  None  MEDICATIONS: 3 mg Versed, 75 mcg fentanyl. A radiology nurse monitored the patient for moderate sedation.  ANESTHESIA/SEDATION: Moderate sedation time: 20 minutes  PROCEDURE: The procedure was explained  to the patient. The risks and benefits of the procedure were discussed and the patient's questions were addressed. Informed consent was obtained from the patient. The liver was evaluated with ultrasound. Left hepatic lobe was selected for biopsy. The anterior abdomen was prepped and draped in sterile fashion. 1% lidocaine was used for local anesthetic. Using ultrasound guidance, 17 gauge needle was directed into the left hepatic lobe. Three core biopsies were obtained with an 18 gauge core device. The tract was embolized with Gel-Foam. Bandage placed over the puncture site.  FINDINGS: The liver has a nodular contour. Small amount of ascites along the right side of the liver. Left hepatic lobe was selected for biopsy because there was no adjacent ascites. No evidence for bleeding or hematoma following the core biopsies.  Estimated blood loss: Minimal  COMPLICATIONS: None  IMPRESSION: Ultrasound-guided core biopsies of the left hepatic lobe.   Electronically Signed   By: Richarda OverlieAdam  Henn M.D.   On: 04/17/2015 13:02   Dg Chest Port 1 View  04/18/2015   CLINICAL DATA:  Endotracheal tube and central line placements  EXAM: PORTABLE CHEST - 1 VIEW  COMPARISON:  04/17/2015  FINDINGS: The endotracheal tube tip is 1.5 cm above the carina. The nasogastric tube extends into the stomach. There is a left jugular central line with tip in the cavoatrial junction. There is no pneumothorax. There is no dense airspace consolidation. There is no large effusion.  IMPRESSION: Support equipment appears satisfactorily positioned.   Electronically Signed   By: Ellery Plunkaniel R Mitchell M.D.   On: 04/18/2015 00:40   Dg Chest Port 1 View  04/17/2015   CLINICAL DATA:  Shortness of Breath  EXAM: PORTABLE CHEST - 1 VIEW  COMPARISON:  April 03, 2015  FINDINGS: Degree of inspiration is shallow. There is no edema or consolidation. Heart size and pulmonary vascularity are within normal limits given the degree of shallow inspiratory effort. No adenopathy. No  bone lesions. No pneumothorax.  IMPRESSION: No edema or consolidation.  Note shallow degree of inspiration.   Electronically Signed   By: Bretta BangWilliam  Woodruff III M.D.   On: 04/17/2015 20:21   Dg Abd Portable 1v  04/17/2015   CLINICAL DATA:  29 year old female abdominal pain and distention x2 weeks.  EXAM: PORTABLE ABDOMEN - 1 VIEW  COMPARISON:  Abdominal ultrasound dated 10/16/15 and MRI dated 04/12/2015.  FINDINGS:  This is a nonspecific bowel gas pattern. Moderate stool noted throughout the colon. Right upper quadrant cholecystectomy clips. The osseous structures appear unremarkable.  IMPRESSION: Nonspecific bowel gas pattern without definite evidence of obstruction.   Electronically Signed   By: Elgie Collard M.D.   On: 04/17/2015 20:26    ASSESSMENT & PLAN:  29 year old African-American female, who is known history of sickle cell SS disease, presented with worsening anemia and pain crisis.  She was found to have liver cirrhosis on CT, and underwent liver biopsy yesterday morning. She developed hemorrhagic shock after biopsy, acute renal failure with metabolic acidosis and hypercalcemia, and intubated last night.   1. Hemorrhagic shock after liver biopsy, status post left hepatic artery embolization 6/28, hypotension resolved 2. Severe anemia from bleeding and baseline sickle cell disease and hemolysis, no evidence of autoimmune hemostasis 3. Liver cirrhosis  4. Coagulopathy from liver cirrhosis and acute hemorrhage 5. AKI  6. Hb SS with crisis  7. 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 Thrombocytopenia, likely secondary to acute bleeding  Recommendations: -continue supportive care, blood transfusion if Hb<7. Given her multiple prior blood transfusion, potential allo-antibody from blood transfusion, and history of possible transfusion-related hemoptysis, need to inform the blood bank for careful antibody workup before blood transfusion.  -follow up PT/INR and fibrinogen level closely, consider  FFP and cryo (if fibrinogen <100) to improve her coagulopathy  -follow CBC with reticular count, LDH closely, consider plt transfusion only if active bleeding and plt less than 50K. Be cautious about all-antibody induced hemolysis  -other management per ICU, GI and renal team  I'll follow up.   Malachy Mood, MD 04/18/2015  9:19 AM

## 2015-04-18 NOTE — Progress Notes (Signed)
Hgb results noted.  Early am Hgb 5.5, post transfusion to 5.7.  Concern for ongoing bleeding despite L hepatic artery embolization.  Hypothermia noted - temp 91.2, bair hugger in place.  VSS at present.     Plan: -Case discussed with IR > will only be able to target the same area for embolization without CTA of ABD to look for new area of bleeding.  Concern is additional dye load with CTA of abd + contrast for embolization  -CCS consulted for surgical evaluation -2 units PRBC's now  -Post transfusion H/H -family updated at bedside -2 units FFP now -follow INR this PM (1600) -monitor closely for hemodynamic changes   Canary BrimBrandi Ollis, NP-C [facility] Pgr: [phone] or if no answer [phone] 04/18/2015, 12:42 PM

## 2015-04-18 NOTE — Progress Notes (Signed)
Chaplain paged to provide spiritual care and comfort for Ms Joan Martin and her family. Ms Joan Martin was sedated when chaplain arrived. Ms Forster's father was at the bedside and active in the care of his daughter. It was from him that I was given the information that he is here from [place], and his wife (the mother of Ms Joan Martin) is traveling from [place] to be with them. A sister who attends [employer], was in a near by waiting room, at the time too overwhelmed to see her sister in such a poor physical state. This sister also has sickle cell issues.  The family is deeply religious. The family is [employer]. The request for a chaplain came from the father with the request that a chaplain come and pray for Ms Joan Martin.   Chaplain prayed for Ms Joan Martin and provided spiritual and emotional comfort and care.   Chaplains are available at all times. Please alert the chaplain should there be a request or need for further spiritual care.  Benjie Karvonenharles D. Breiona Couvillon, DMin Chaplain

## 2015-04-18 NOTE — Progress Notes (Signed)
ANTIBIOTIC CONSULT NOTE - INITIAL  Pharmacy Consult for vancomycin, aztreonam Indication: pneumonia  Allergies  Allergen Reactions  . Cephalosporins     Possible family reaction  . Citrus Other (See Comments)    Severity varies/ Reaction varies hives around mouth to swelling around mouth and lips.   . Morphine And Related Hives    Patient states she can take IV dilaudid with benadryl    Patient Measurements: Height: 5\' 7"  (170.2 cm) Weight: 148 lb 1.6 oz (67.178 kg) IBW/kg (Calculated) : 61.6 Adjusted Body Weight:   Vital Signs: Temp: 97.7 F (36.5 C) (06/29 0230) Temp Source: Core (Comment) (06/29 0215) BP: 109/67 mmHg (06/29 0547) Pulse Rate: 98 (06/29 0547) Intake/Output from previous day: 06/28 0701 - 06/29 0700 In: 1351 [P.O.:240; I.V.:625; Blood:426] Out: 1140 [Urine:1140] Intake/Output from this shift: Total I/O In: 1111 [I.V.:625; Blood:426; Other:60] Out: -   Labs:  Recent Labs  04/17/15 0353 04/17/15 2037 04/18/15 0222  WBC 24.1* 30.5* 32.3*  HGB 6.9* 4.7* 5.5*  PLT 152 89* 112*  CREATININE 0.32* 0.62 1.09*   Estimated Creatinine Clearance: 73.4 mL/min (by C-G formula based on Cr of 1.09). No results for input(s): VANCOTROUGH, VANCOPEAK, VANCORANDOM, GENTTROUGH, GENTPEAK, GENTRANDOM, TOBRATROUGH, TOBRAPEAK, TOBRARND, AMIKACINPEAK, AMIKACINTROU, AMIKACIN in the last 72 hours.   Microbiology: Recent Results (from the past 720 hour(s))  Culture, blood (single)     Status: None   Collection Time: 04/03/15  3:26 PM  Result Value Ref Range Status   Organism ID, Bacteria NO GROWTH 5 DAYS  Final  MRSA PCR Screening     Status: None   Collection Time: 04/17/15  8:36 PM  Result Value Ref Range Status   MRSA by PCR NEGATIVE NEGATIVE Final    Comment:        The GeneXpert MRSA Assay (FDA approved for NASAL specimens only), is one component of a comprehensive MRSA colonization surveillance program. It is not intended to diagnose MRSA infection nor to  guide or monitor treatment for MRSA infections.     Medical History: Past Medical History  Diagnosis Date  . Sickle cell anemia   . Stroke     7 years ago  . Sickle cell anemia   . Asthma     Medications:  Anti-infectives    Start     Dose/Rate Route Frequency Ordered Stop   04/18/15 0600  vancomycin (VANCOCIN) IVPB 750 mg/150 ml premix     750 mg 150 mL/hr over 60 Minutes Intravenous Every 12 hours 04/18/15 0558     04/17/15 2315  aztreonam (AZACTAM) 2 g in dextrose 5 % 50 mL IVPB     2 g 100 mL/hr over 30 Minutes Intravenous 3 times per day 04/17/15 2313     04/17/15 2315  vancomycin (VANCOCIN) IVPB 1000 mg/200 mL premix  Status:  Discontinued     1,000 mg 200 mL/hr over 60 Minutes Intravenous 3 times per day 04/17/15 2313 04/18/15 0557     Assessment: Patient with HCAP.    Goal of Therapy:  Vancomycin trough level 15-20 mcg/ml  Aztreonam dosed based on patient weight and renal function   Plan:  Measure antibiotic drug levels at steady state Follow up culture results was to be dosed vancomycin 1gm iv q8hr, but renal function has changed and now will dose 750mg  iv q12hr  Aztreonam 2gm iv q8hr  Darlina GuysGrimsley Jr, Chai Verdejo Crowford 04/18/2015,5:58 AM

## 2015-04-18 NOTE — Progress Notes (Signed)
eLink Physician-Brief Progress Note Patient Name: Joan Martin DOB: 11/23/1985 MRN: [id number]   Date of Service  04/18/2015  HPI/Events of Note  Hyperkalemia - K+ = 7.2. Patient already has another dose of Kayexalate ordered.   eICU Interventions  Will order: 1. D50 - 1 amp IV now.  2. Insulin 10 units IV now.  3. NaHCO3 100 meq IV now.  4. Re-check BMP at 8 AM.     Intervention Category Major Interventions: Electrolyte abnormality - evaluation and management  Elsworth Ledin Eugene 04/18/2015, 4:28 AM

## 2015-04-18 NOTE — Consult Note (Signed)
Joan Martin Admit Date: 04/06/2015 04/18/2015 Joan Martin, Joan Martin Requesting Physician:  Joan FriesMcQuaid MD  Reason for Consult:  AKI, Anuria, Hyperkalemia, Metabolic Acidosis HPI:  29 year old female seen at the request of Dr. Kendrick Martin for the above issues. Patient admitted 9 days ago with likely sickle cell pain crisis and was found to have elevated LFTs and concerns for cirrhosis.She had normal renal function at that time. In the process of evaluating her liver disease she underwent a liver biopsy yesterday. Over the past 24 hours she decompensated with a metabolic acidosis with elevated lactic acid, worsened anemia, and found to have hemoperitoneum. She required intubation and now is on 50% FiO2. Overnight she had successful embolization at a site of active extravasation from her liver biopsy. She has hyperkalemia with a potassium most recently of 7.2, serum bicarbonate of 9, elevated anion gap. She does not currently require vasopressin.   Overnight she had a CT the abdomen and pelvis with IV contrast. She has been receiving standing ibuprofen. Overnight she has had nearly 0mL urine output. her creatinine is increased value of 0.3 to greater than 1.09 .  CREAT (mg/dL)  Date Value  69/62/952806/20/2016 1.73*  04/03/2015 0.85  03/29/2014 0.71  02/23/2014 0.65  05/09/2013 0.82   CREATININE, SER (mg/dL)  Date Value  41/32/440106/29/2016 1.09*  04/17/2015 0.62  04/17/2015 0.32*  04/15/2015 <0.30*  04/14/2015 <0.30*  04/13/2015 <0.30*  04/12/2015 <0.30*  04/12/2015 0.40*  04/11/2015 0.49  04/10/2015 0.49  ] I/Os: I/O last 3 completed shifts: In: 1260 [P.O.:240; I.V.:1020] Out: 1140 [Urine:1140]   ROS IV Contrast: Rec overnight 04/16/16 NSAIDS:Has been receiving throughout this hospital stay TMP/SMX no exposure Hypotension no exposure Balance of 12 systems is negative w/ exceptions as above  PMH  Past Medical History  Diagnosis Date  . Sickle cell anemia   . Stroke     7 years ago  . Sickle cell  anemia   . Asthma    PSH  Past Surgical History  Procedure Laterality Date  . Cholecystectomy     FH  Family History  Problem Relation Age of Onset  . Diabetes Mother   . Hypertension Mother   . Diabetes Father   . Hypertension Father   . Cancer Maternal Aunt   . Sickle cell anemia Sister    SH  reports that she has never smoked. She has never used smokeless tobacco. She reports that she does not drink alcohol or use illicit drugs. Allergies  Allergies  Allergen Reactions  . Cephalosporins     Possible family reaction  . Citrus Other (See Comments)    Severity varies/ Reaction varies hives around mouth to swelling around mouth and lips.   . Morphine And Related Hives    Patient states she can take IV dilaudid with benadryl   Home medications Prior to Admission medications   Medication Sig Start Date End Date Taking? Authorizing Provider  albuterol (PROVENTIL) (2.5 MG/3ML) 0.083% nebulizer solution Take 3 mLs (2.5 mg total) by nebulization every 6 (six) hours as needed for shortness of breath. 12/01/13  Yes Joan MaroonLachina M Hollis, FNP  diphenhydrAMINE (BENADRYL) 25 MG tablet Take 25 mg by mouth every 6 (six) hours as needed for itching.   Yes Historical Provider, MD  Fluticasone-Salmeterol (ADVAIR) 250-50 MCG/DOSE AEPB Inhale 1 puff into the lungs daily.   Yes Historical Provider, MD  folic acid (FOLVITE) 1 MG tablet Take 1 tablet (1 mg total) by mouth daily. 01/03/14  Yes Joan MaroonLachina M Hollis, FNP  hydroxyurea (HYDREA)  500 MG capsule TAKE ONE CAPSULE BY MOUTH TWICE DAILY   Yes Joan Harm, MD  ibuprofen (ADVIL,MOTRIN) 600 MG tablet Take 1 tablet (600 mg total) by mouth every 8 (eight) hours as needed for pain. 07/27/13  Yes Joan Sessions, NP  mometasone (NASONEX) 50 MCG/ACT nasal spray Place 2 sprays into the nose daily. Patient taking differently: Place 2 sprays into the nose at bedtime.  11/23/14  Yes Joan Maroon, FNP  ondansetron (ZOFRAN) 4 MG tablet Take 1 tablet (4 mg  total) by mouth every 6 (six) hours. Patient taking differently: Take 4 mg by mouth every 6 (six) hours as needed for nausea or vomiting.  10/19/14  Yes Joan Maroon, FNP  oxyCODONE-acetaminophen (PERCOCET) 10-325 MG per tablet Take 1 tablet every 4 hours for severe pain Patient taking differently: Take 1 tablet by mouth every 4 (four) hours as needed for pain.  02/13/15  Yes Joan Maroon, FNP  promethazine (PHENERGAN) 25 MG tablet Take 25 mg by mouth every 6 (six) hours as needed for nausea.   Yes Historical Provider, MD  topiramate (TOPAMAX) 50 MG tablet Take 0.5tab BID x7d, then 1tab BID. Patient taking differently: Take 50 mg by mouth 2 (two) times daily.  01/17/15  Yes Joan Dallas, DO  SUMAtriptan (IMITREX) 25 MG tablet Take 1 tablet (25 mg total) by mouth once. May repeat in 2 hours if headache persists or recurs. Not to exceed 2 doses in 24 hours 10/19/14   Joan Maroon, FNP  Vitamin D, Ergocalciferol, (DRISDOL) 50000 UNITS CAPS capsule TAKE 1 CAPSULE BY MOUTH ONCE WEEKLY 04/10/15   Joan Maroon, FNP    Current Medications Scheduled Meds: . sodium chloride   Intravenous Once  . sodium chloride   Intravenous Once  . sodium chloride   Intravenous Once  . antiseptic oral rinse  7 mL Mouth Rinse QID  . aztreonam  2 g Intravenous 3 times per day  . calcium gluconate  1 g Intravenous Once  . calcium gluconate  1 g Intravenous Once  . chlorhexidine  15 mL Mouth Rinse BID  . famotidine (PEPCID) IV  20 mg Intravenous Q12H  . fentaNYL (SUBLIMAZE) injection  100 mcg Intravenous Once  . fluticasone  1 spray Each Nare Daily  . folic acid  1 mg Oral Daily  . heparin lock flush      . lactulose  30 g Per Tube TID  . lidocaine (cardiac) 100 mg/11ml      . lidocaine-EPINEPHrine      . mometasone-formoterol  2 puff Inhalation BID  . senna-docusate  1 tablet Oral QHS  . sodium bicarbonate  50 mEq Intravenous Once  . succinylcholine      . topiramate  25 mg Oral Q12H  . vancomycin   750 mg Intravenous Q12H  . Vitamin D (Ergocalciferol)  50,000 Units Oral Q Sun   Continuous Infusions: . dextrose 75 mL/hr at 04/18/15 0604  . fentaNYL infusion INTRAVENOUS 50 mcg/hr (04/17/15 2300)  . norepinephrine (LEVOPHED) Adult infusion    . dialysis replacement fluid (prismasate)    . dialysis replacement fluid (prismasate)    . dialysate (PRISMASATE)    .  sodium bicarbonate  infusion 1000 mL 100 mL/hr at 04/17/15 2209   PRN Meds:.albuterol, bisacodyl, diphenhydrAMINE, feeding supplement (ENSURE ENLIVE), fentaNYL, heparin, HYDROmorphone (DILAUDID) injection, midazolam, midazolam, oxyCODONE, polyethylene glycol, promethazine, sodium chloride  CBC  Recent Labs Lab 04/15/15 0611 04/17/15 0353 04/17/15 2037 04/18/15 0222  WBC  23.8* 24.1* 30.5* 32.3*  NEUTROABS 8.8* 12.3*  --  22.7*  HGB 7.1* 6.9* 4.7* 5.5*  HCT 20.6* 19.7* 13.8* 16.7*  MCV 96.3 96.6 97.9 96.0  PLT 165 152 89* 112*   Basic Metabolic Panel  Recent Labs Lab 04/12/15 1900 04/13/15 0440 04/14/15 1247 04/15/15 0611 04/17/15 0353 04/17/15 2037 04/18/15 0222  NA 137 137 137 135 136 138 138  K 4.1 4.2 4.2 4.1 4.1 6.4* 7.2*  CL 114* 116* 116* 116* 116* 117* 114*  CO2 15* 13* 13* 14* 15* 9* 9*  GLUCOSE 90 68 81 67 79 20* 96  BUN 13 13 14 15 15 19  21*  CREATININE <0.30* <0.30* <0.30* <0.30* 0.32* 0.62 1.09*  CALCIUM 8.2* 8.2* 8.8* 8.6* 9.1 9.2 7.5*  PHOS  --   --   --   --   --   --  10.0*    Physical Exam  Blood pressure 105/60, pulse 95, temperature 97.7 F (36.5 C), temperature source Core (Comment), resp. rate 28, height 5\' 7"  (1.702 m), weight 67.178 kg (148 lb 1.6 oz), last menstrual period 04/03/2015, SpO2 100 %. GEN: intubated/sedated ENT: ETT in place EYES: eyes closed CV: regular, nl s1s2 PULM: coarse bs Martin/l ABD: distended SKIN: no rashes, L femoral TDC in place EXT:no peripheral edema   Assessment/Plan 54F with AKI (BL SCr < 0.3), hyperkalemia, inc AG metabolic acidosis,  hemoperitoneum / abd hemorrhage after liver biopsy s/p embolization, sickle cell anemia.  1. AKI, anuric, related to hypovolemia + IV Contrast (6/28) + NSAIDs --> ATN 1. BL SCr < 0.3 2. Begin CRRT 04/18/15 1. High pre/post and DFR to aid K clearance 2. Start on 4K bath, if hyperK persists after improvement in acidosis and aggressive dialysis change to [facility] 3. No UF 4. Pt can not rec heparin or citrate for anticoagulation 5. Using L Femoral TDC 6. Father updated 2. Hyperkalemia: as per #1 1. Overnight has red HCO3 replacement and 30gm kayexalate and insulin 2. Need to update 3. Inc AG Metabolic Acidosis with elevated lactic acid level  1. On IV NaHCO3 gtt currently + PCCM has been giving amps of NaHCO3 overnight 2. Per #1 4. VDRF 2/2 inadequate resp compensation for acidosis 5. Hemoperitoneum after liver biopsy s/p embolization 04/17/15 6. Sickle Cell Anemia + ABLA; 2u PRBC g iven overnight 7. ? HCAP/Sepsis on empiric Vanc/Aztreonam per PCCM and Pharmacy 8. AMS with hepatic encephalopathy 9. Cirrhosis / ALF 10. Coagulopathy, inc INR; to rec FFP  Sabra Heck MD [phone] pgr 04/18/2015, 6:36 AM

## 2015-04-18 NOTE — Progress Notes (Signed)
eLink Physician-Brief Progress Note Patient Name: Joan Martin DOB: 11/28/1985 MRN: [id number]   Date of Service  04/18/2015  HPI/Events of Note  Multiple issues: 1. NH3 = 141, 2. ABG = 7.12/24/449/7.8 3. Head CT Scan negative to my read. 4. CT Abdomen/Pelvis - Blood in abdomen. Active Arterial bleeding noted. 5. Blood glucose = 82. Note that the patient is getting 2 units of PRBC.   eICU Interventions  Will order: 1. I have spoken to Dr. Elmon KirschnerWatt from IR about arteriography and possible embolization of arterial bleeding.  2. Lactulose 30 gm via OGT now and TID. 3. Increase D10 IV infusion to 75 mL/hour.  4. NaHCO3 100 meq IV now.  5. ABG at 3 AM.     Intervention Category Major Interventions: Hemorrhage - evaluation and management;Acid-Base disturbance - evaluation and management;Respiratory failure - evaluation and management  [name] 04/18/2015, 1:57 AM

## 2015-04-18 NOTE — Progress Notes (Signed)
Repeat ABG shows resolution of severe acidemia, will dc NaHCO3 gtt. Cont CRRT, keeping even.  HR/BP stable, no pressors.   Vinson Moselleob Kensleigh Gates MD (pgr) [phone]    [phone] 04/18/2015, 1:23 PM

## 2015-04-18 NOTE — Progress Notes (Signed)
eLink Physician-Brief Progress Note Patient Name: Joan Martin DOB: 02/13/1986 MRN: [id number]   Date of Service  04/18/2015  HPI/Events of Note  Active Intra-abdominal hemorrhage. Last INR = 2.09.  eICU Interventions  Will transfuse 2 units FFP when available.      Intervention Category Major Interventions: Hemorrhage - evaluation and management  Kasyn Rolph Eugene 04/18/2015, 2:28 AM

## 2015-04-19 ENCOUNTER — Inpatient Hospital Stay (HOSPITAL_COMMUNITY): Payer: Medicaid Other

## 2015-04-19 ENCOUNTER — Ambulatory Visit: Payer: Medicaid Other | Admitting: Neurology

## 2015-04-19 DIAGNOSIS — K7460 Unspecified cirrhosis of liver: Secondary | ICD-10-CM | POA: Insufficient documentation

## 2015-04-19 DIAGNOSIS — R58 Hemorrhage, not elsewhere classified: Secondary | ICD-10-CM

## 2015-04-19 LAB — PREPARE FRESH FROZEN PLASMA
UNIT DIVISION: 0
Unit division: 0
Unit division: 0
Unit division: 0

## 2015-04-19 LAB — PREPARE PLATELET PHERESIS
Unit division: 0
Unit division: 0

## 2015-04-19 LAB — HEPATIC FUNCTION PANEL
ALK PHOS: 291 U/L — AB (ref 38–126)
ALK PHOS: 354 U/L — AB (ref 38–126)
ALT: 981 U/L — ABNORMAL HIGH (ref 14–54)
ALT: 1331 U/L — ABNORMAL HIGH (ref 14–54)
AST: 6302 U/L — ABNORMAL HIGH (ref 15–41)
AST: 9238 U/L — ABNORMAL HIGH (ref 15–41)
Albumin: 2 g/dL — ABNORMAL LOW (ref 3.5–5.0)
Albumin: 1.9 g/dL — ABNORMAL LOW (ref 3.5–5.0)
BILIRUBIN TOTAL: 33.7 mg/dL — AB (ref 0.3–1.2)
BILIRUBIN TOTAL: 35.4 mg/dL — AB (ref 0.3–1.2)
Bilirubin, Direct: 19.1 mg/dL — ABNORMAL HIGH (ref 0.1–0.5)
Bilirubin, Direct: 23 mg/dL — ABNORMAL HIGH (ref 0.1–0.5)
Indirect Bilirubin: 14.6 mg/dL — ABNORMAL HIGH (ref 0.3–0.9)
Indirect Bilirubin: 12.4 mg/dL — ABNORMAL HIGH (ref 0.3–0.9)
TOTAL PROTEIN: 4.9 g/dL — AB (ref 6.5–8.1)
TOTAL PROTEIN: 5 g/dL — AB (ref 6.5–8.1)

## 2015-04-19 LAB — PROTIME-INR
INR: 2.01 — AB (ref 0.00–1.49)
INR: 2.13 — AB (ref 0.00–1.49)
PROTHROMBIN TIME: 22.7 s — AB (ref 11.6–15.2)
Prothrombin Time: 23.6 seconds — ABNORMAL HIGH (ref 11.6–15.2)

## 2015-04-19 LAB — BLOOD GAS, ARTERIAL
Acid-base deficit: 8.5 mmol/L — ABNORMAL HIGH (ref 0.0–2.0)
Bicarbonate: 15.6 mEq/L — ABNORMAL LOW (ref 20.0–24.0)
Drawn by: 422461
FIO2: 0.3 %
MECHVT: 490 mL
O2 SAT: 95.8 %
PEEP: 5 cmH2O
Patient temperature: 36
RATE: 28 resp/min
TCO2: 15 mmol/L (ref 0–100)
pCO2 arterial: 27.1 mmHg — ABNORMAL LOW (ref 35.0–45.0)
pH, Arterial: 7.372 (ref 7.350–7.450)
pO2, Arterial: 77.7 mmHg — ABNORMAL LOW (ref 80.0–100.0)

## 2015-04-19 LAB — RENAL FUNCTION PANEL
ALBUMIN: 1.9 g/dL — AB (ref 3.5–5.0)
ANION GAP: 18 — AB (ref 5–15)
Albumin: 1.9 g/dL — ABNORMAL LOW (ref 3.5–5.0)
Anion gap: 17 — ABNORMAL HIGH (ref 5–15)
BUN: 17 mg/dL (ref 6–20)
BUN: 12 mg/dL (ref 6–20)
CALCIUM: 5.6 mg/dL — AB (ref 8.9–10.3)
CHLORIDE: 106 mmol/L (ref 101–111)
CO2: 16 mmol/L — ABNORMAL LOW (ref 22–32)
CO2: 13 mmol/L — ABNORMAL LOW (ref 22–32)
CREATININE: 0.39 mg/dL — AB (ref 0.44–1.00)
Calcium: 6.1 mg/dL — CL (ref 8.9–10.3)
Chloride: 105 mmol/L (ref 101–111)
Creatinine, Ser: 0.47 mg/dL (ref 0.44–1.00)
GFR calc non Af Amer: >60 mL/min (ref >60)
Glucose, Bld: 94 mg/dL (ref 65–99)
Glucose, Bld: 110 mg/dL — ABNORMAL HIGH (ref 65–99)
PHOSPHORUS: 2.9 mg/dL (ref 2.5–4.6)
Phosphorus: 3.5 mg/dL (ref 2.5–4.6)
Potassium: 4 mmol/L (ref 3.5–5.1)
Potassium: 4.3 mmol/L (ref 3.5–5.1)
SODIUM: 136 mmol/L (ref 135–145)
Sodium: 139 mmol/L (ref 135–145)

## 2015-04-19 LAB — GLUCOSE, CAPILLARY
Glucose-Capillary: 82 mg/dL (ref 65–99)
Glucose-Capillary: 77 mg/dL (ref 65–99)
Glucose-Capillary: 94 mg/dL (ref 65–99)
Glucose-Capillary: 79 mg/dL (ref 65–99)

## 2015-04-19 LAB — CBC
HCT: 22.9 % — ABNORMAL LOW (ref 36.0–46.0)
HCT: 20.3 % — ABNORMAL LOW (ref 36.0–46.0)
Hemoglobin: 8.1 g/dL — ABNORMAL LOW (ref 12.0–15.0)
Hemoglobin: 7 g/dL — ABNORMAL LOW (ref 12.0–15.0)
MCH: 29.6 pg (ref 26.0–34.0)
MCH: 29.8 pg (ref 26.0–34.0)
MCHC: 35.4 g/dL (ref 30.0–36.0)
MCHC: 34.5 g/dL (ref 30.0–36.0)
MCV: 83.6 fL (ref 78.0–100.0)
MCV: 86.4 fL (ref 78.0–100.0)
PLATELETS: 176 10*3/uL (ref 150–400)
PLATELETS: 154 10*3/uL (ref 150–400)
RBC: 2.74 MIL/uL — ABNORMAL LOW (ref 3.87–5.11)
RBC: 2.35 MIL/uL — ABNORMAL LOW (ref 3.87–5.11)
RDW: 18.7 % — ABNORMAL HIGH (ref 11.5–15.5)
RDW: 20.3 % — ABNORMAL HIGH (ref 11.5–15.5)
WBC: 25.2 10*3/uL — ABNORMAL HIGH (ref 4.0–10.5)
WBC: 28.2 10*3/uL — AB (ref 4.0–10.5)

## 2015-04-19 LAB — BASIC METABOLIC PANEL
ANION GAP: 20 — AB (ref 5–15)
BUN: 20 mg/dL (ref 6–20)
CHLORIDE: 107 mmol/L (ref 101–111)
CO2: 14 mmol/L — ABNORMAL LOW (ref 22–32)
Calcium: 6.4 mg/dL — CL (ref 8.9–10.3)
Creatinine, Ser: 0.61 mg/dL (ref 0.44–1.00)
GFR calc non Af Amer: >60 mL/min (ref >60)
Glucose, Bld: 74 mg/dL (ref 65–99)
Potassium: 4.3 mmol/L (ref 3.5–5.1)
SODIUM: 141 mmol/L (ref 135–145)

## 2015-04-19 LAB — CK TOTAL AND CKMB (NOT AT ARMC)
CK, MB: 24.1 ng/mL — ABNORMAL HIGH (ref 0.5–5.0)
RELATIVE INDEX: 3.5 — AB (ref 0.0–2.5)
Total CK: 684 U/L — ABNORMAL HIGH (ref 38–234)

## 2015-04-19 LAB — CK: CK TOTAL: 1726 U/L — AB (ref 38–234)

## 2015-04-19 LAB — PROCALCITONIN: Procalcitonin: 6.75 ng/mL

## 2015-04-19 LAB — LACTIC ACID, PLASMA: LACTIC ACID, VENOUS: 7.8 mmol/L — AB (ref 0.5–2.0)

## 2015-04-19 LAB — FIBRINOGEN: FIBRINOGEN: 175 mg/dL — AB (ref 204–475)

## 2015-04-19 LAB — PHOSPHORUS: Phosphorus: 3.6 mg/dL (ref 2.5–4.6)

## 2015-04-19 LAB — VANCOMYCIN, TROUGH: Vancomycin Tr: 7 ug/mL — ABNORMAL LOW (ref 10.0–20.0)

## 2015-04-19 LAB — MAGNESIUM: Magnesium: 2.1 mg/dL (ref 1.7–2.4)

## 2015-04-19 MED ORDER — ARTIFICIAL TEARS OP OINT
TOPICAL_OINTMENT | Freq: Three times a day (TID) | OPHTHALMIC | Status: DC | PRN
  Filled 2015-04-19: qty 3.5

## 2015-04-19 MED ORDER — DEXTROSE-NACL 5-0.9 % IV SOLN: INTRAVENOUS | Status: DC

## 2015-04-19 MED ORDER — SODIUM CHLORIDE 0.9 % IV SOLN
250.0000 mg | Freq: Four times a day (QID) | INTRAVENOUS | Status: DC
  Administered 2015-04-19 – 2015-04-21 (×8): 250 mg via INTRAVENOUS
  Filled 2015-04-19 (×10): qty 250

## 2015-04-19 MED ORDER — SODIUM CHLORIDE 0.9 % IV BOLUS (SEPSIS)
1000.0000 mL | Freq: Once | INTRAVENOUS | Status: AC
  Administered 2015-04-19: 1000 mL via INTRAVENOUS

## 2015-04-19 MED ORDER — VITAMIN K1 10 MG/ML IJ SOLN
5.0000 mg | Freq: Once | INTRAVENOUS | Status: AC
  Administered 2015-04-19: 5 mg via INTRAVENOUS
  Filled 2015-04-19: qty 0.5

## 2015-04-19 MED ORDER — SODIUM CHLORIDE 0.9 % IV SOLN
1.0000 g | Freq: Once | INTRAVENOUS | Status: AC
  Administered 2015-04-19: 1 g via INTRAVENOUS
  Filled 2015-04-19: qty 10

## 2015-04-19 MED ORDER — DEXTROSE 5 % IV SOLN
2.0000 ug/min | INTRAVENOUS | Status: DC
  Administered 2015-04-19: 40 ug/min via INTRAVENOUS
  Administered 2015-04-19: 5 ug/min via INTRAVENOUS
  Administered 2015-04-20: 40 ug/min via INTRAVENOUS
  Administered 2015-04-20 (×2): 50 ug/min via INTRAVENOUS
  Administered 2015-04-20: 40 ug/min via INTRAVENOUS
  Administered 2015-04-21: 50 ug/min via INTRAVENOUS
  Filled 2015-04-19 (×8): qty 16

## 2015-04-19 MED ORDER — VANCOMYCIN HCL IN DEXTROSE 1-5 GM/200ML-% IV SOLN
1000.0000 mg | INTRAVENOUS | Status: DC
Start: 2015-04-20 — End: 2015-04-21
  Administered 2015-04-20 – 2015-04-21 (×2): 1000 mg via INTRAVENOUS
  Filled 2015-04-19 (×2): qty 200

## 2015-04-19 MED ORDER — SODIUM CHLORIDE 0.9 % IV SOLN
Freq: Once | INTRAVENOUS | Status: DC
Start: 2015-04-19 — End: 2015-04-19

## 2015-04-19 MED ORDER — VANCOMYCIN HCL IN DEXTROSE 1-5 GM/200ML-% IV SOLN
1000.0000 mg | Freq: Once | INTRAVENOUS | Status: AC
  Administered 2015-04-19: 1000 mg via INTRAVENOUS
  Filled 2015-04-19: qty 200

## 2015-04-19 MED ORDER — DEXTROSE 50 % IV SOLN
INTRAVENOUS | Status: AC
  Administered 2015-04-19: 50 mL
  Filled 2015-04-19: qty 50

## 2015-04-19 NOTE — Progress Notes (Signed)
Subjective: Sedated on the Vent, on Dialysis, on pressors, bleeding from multiple sites.  They have had to go back up on pressor.  Objective: Vital signs in last 24 hours: Temp:  [90.3 F (32.4 C)-97.2 F (36.2 C)] 95.5 F (35.3 C) (06/30 1100) Pulse Rate:  [65-89] 89 (06/30 0812) Resp:  [0-30] 28 (06/30 1100) BP: (89-114)/(47-77) 109/55 mmHg (06/30 0812) SpO2:  [93 %-100 %] 97 % (06/30 1100) Arterial Line BP: (88-123)/(44-72) 102/51 mmHg (06/30 1100) FiO2 (%):  [30 %] 30 % (06/30 0812) Weight:  [78.1 kg (172 lb 2.9 oz)] 78.1 kg (172 lb 2.9 oz) (06/30 0500) Last BM Date: 04/19/15 55 ml urine yesterday Temp still around 95,  lft's rising, INR 2.01 this AM I cannot tell how much blood and FFP she has received. Intake/Output from previous day: 06/29 0701 - 06/30 0700 In: 7068.1 [I.V.:2934.6; Blood:2443.5; NG/GT:120; IV Piggyback:1520] Out: 65 [Urine:55; Emesis/NG output:10] Intake/Output this shift: Total I/O In: 668.5 [I.V.:388.5; Other:30; IV Piggyback:250] Out: 440 [Other:440]  General appearance: Sedated and on Vent, she did open her eyes.  she is bleeding from subglotic space, and multiple other sites GI: distended tense, no BS.    Lab Results:   Recent Labs  04/18/15 2020 04/19/15 0500  WBC 22.5* 25.2*  HGB 7.6* 8.1*  HCT 22.4* 22.9*  PLT 185  167 176    BMET  Recent Labs  04/18/15 2020 04/19/15 0456  NA 141 139  K 4.3 4.0  CL 107 106  CO2 14* 16*  GLUCOSE 74 94  BUN 20 17  CREATININE 0.61 0.39*  CALCIUM 6.4* 6.1*   PT/INR  Recent Labs  04/18/15 2020 04/19/15 0456  LABPROT 21.4*  21.5* 22.7*  INR 1.86*  1.88* 2.01*     Recent Labs Lab 04/15/15 0611 04/17/15 0353 04/18/15 0222 04/18/15 2020 04/19/15 0456  AST 406* 347* 948* 6302* 9238*  ALT 112* 101* 183* 981* 1331*  ALKPHOS 520* 506* 345* 291* 354*  BILITOT 33.0* 27.9* 25.5* 33.7* 35.4*  PROT 5.7* 5.6* 4.6* 4.9* 5.0*  ALBUMIN 1.6* 1.5* 1.2* 2.0* 1.9*  1.9*     Lipase      Component Value Date/Time   LIPASE >3000* 04/18/2015 2020     Studies/Results: Ct Head Wo Contrast  04/18/2015   CLINICAL DATA:  Evaluate for acute encephalopathy. Hemorrhage after liver biopsy. History of sickle cell anemia, stroke.  EXAM: CT HEAD WITHOUT CONTRAST  TECHNIQUE: Contiguous axial images were obtained from the base of the skull through the vertex without intravenous contrast.  COMPARISON:  MRI of the brain January 22, 2015  FINDINGS: The ventricles and sulci are normal. No intraparenchymal hemorrhage, mass effect nor midline shift. No acute large vascular territory infarcts.  No abnormal extra-axial fluid collections. Basal cisterns are patent.  No skull fracture. The included ocular globes and orbital contents are non-suspicious. The mastoid aircells and included paranasal sinuses are well-aerated.  IMPRESSION: No acute intracranial process; normal noncontrast CT head for age.   Electronically Signed   By: Elon Alas M.D.   On: 04/18/2015 06:17   Ct Abdomen Pelvis W Contrast  04/18/2015   CLINICAL DATA:  Dropping hemoglobin after liver biopsy 04/17/2015.  EXAM: CT ABDOMEN AND PELVIS WITH CONTRAST  TECHNIQUE: Multidetector CT imaging of the abdomen and pelvis was performed using the standard protocol following bolus administration of intravenous contrast.  CONTRAST:  163m OMNIPAQUE IOHEXOL 300 MG/ML  SOLN  COMPARISON:  None.  FINDINGS: There is a large volume of peritoneal fluid.  This probably is blood based on density of the fluid. There is active arterial hemorrhage during the scan, at the anterior surface of the left hepatic lobe. There are a few bubbles of peritoneal air, presumably from the percutaneous liver biopsy.  The liver is enlarged and cirrhotic. No intraparenchymal or subcapsular hemorrhage is evident. No focal liver lesion is evident. There is prior cholecystectomy.  There are unremarkable appearances of the pancreas and adrenals.  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  The spleen is either absent or very diminutive, consistent with the known sickle cell disease.  The abdominal aorta is normal in caliber. The inferior vena cava is collapsed, consistent with hypovolemia.  Bowel is unremarkable.  There is a Foley catheter and a rectal catheter.  Minimal atelectatic appearing posterior lung base opacities are present bilaterally, as well as trace pleural effusions.  IMPRESSION: Large volume hemoperitoneum with active arterial hemorrhage from the anterior surface of the left hepatic lobe. Recommend IR consultation to consider embolization. These results were called by telephone at the time of interpretation on 04/18/2015 at 1:54 am to charge nurse Jennye Moccasin, who verbally acknowledged these results. I provided him with the name and pager number of the interventionalist on call.  Enlarged, cirrhotic liver.  Bilateral renal infarctions   Electronically Signed   By: Andreas Newport M.D.   On: 04/18/2015 01:57   Ir Angiogram Visceral Selective  04/18/2015   CLINICAL DATA:  29 year old female with a history of recent percutaneous liver biopsy for concern of cirrhosis diagnosis.  The patient subsequently developed intra abdominal hemorrhage and underwent embolization of left hepatic artery branches on the same day. Given that the patient is coagulopathic and is hypothermic, it seems as though she has continued to bleed through the particle embolization, with low hemoglobin and downward trend of her blood pressure.  She presents for mesenteric angiogram and potential further embolization.  EXAM: MESENTERIC ANGIOGRAM, WITH ANGIOGRAM OF THE CELIAC ARTERY, LEFT GASTRIC ARTERY, LEFT HEPATIC ARTERY, AND SEGMENTAL BRANCHES OF THE LEFT HEPATIC ARTERY.  COIL EMBOLIZATION OF INFERIOR SEGMENT LEFT HEPATIC ARTERY.  ANESTHESIA/SEDATION: The patient is intubated, with management from respiratory therapy team.  2.0 Mg IV Versed; fentanyl infusion  Contrast Volume: 120 cc Omni 300  Additional  Medications: 5 under mcg nitroglycerin, 2.5 mg intra-arterial verapamil  FLUOROSCOPY TIME:  29 minutes, 12 seconds Minutes.  PROCEDURE: The procedure, risks, benefits, and alternatives were explained to the patient's family. Questions regarding the procedure were encouraged and answered. The patient understands and consents to the procedure.  The indwelling right common femoral artery sheath was prepped and draped in the usual sterile fashion, as well as the right groin, which was prepped with Betadine in a sterile fashion, and a sterile drape was applied covering the operative field.  Wire was advanced through the indwelling sheath, which was then exchanged over the wire for a 35 cm 5 French sheath. This was attached to a pressure bag with heparinized saline.  A 5 [place] Mickelson catheter was then used to select the celiac artery and a celiac artery angiogram was performed with power injection.  The Mickelson catheter and a Glidewire were used to select the left gastric artery, with the [facility] the catheter advanced over the Glidewire into the proximal left gastric artery.  Left gastric artery angiogram was performed.  100 mcg of nitroglycerin were then infused intra arterial.  A renegade STC catheter was then advanced over a 014 fathom wire through the left gastric artery to select the the  replaced left hepatic artery, with sub selection of the inferior segments of the left hepatic artery, which were visualized to contribute to ongoing contrast pooling.  Sub selection of distal branches was performed with coil embolization with 2 mm and 3 mm diameter interlock coils.  Repeat angiogram of the segments demonstrated on going filling of the target vasculature, and further coil embolization of inferior segment of the left hepatic artery was required.  Gel-Foam slurry was infused after coil embolization of the downgoing/inferior segment.  Repeat angiogram demonstrated no further filling of the target vasculature.  No  complications were encountered.  No significant blood loss was encountered during the procedure.  Patient remained hemodynamically stable through the procedure.  COMPLICATIONS: None.  FINDINGS: Angiogram of the celiac artery demonstrates variant anatomy of the hepatic vasculature, seen on prior study. Replaced left hepatic artery from left gastric artery again visualized, which was selected.  Angiogram demonstrates vessel spasm with attenuation of multiple distal branches, compatible with the patient's current hypovolemic shock.  Selected angiography of segments of the left hepatic artery, segment 4 branch, demonstrate ongoing contrast pooling in the similar location to the comparison study.  After coil embolization of the targeted vasculature, there is no further filling on contrast injection.  IMPRESSION: Status post mesenteric angiogram with sub selection of replaced left hepatic artery via the left gastric artery, with coil embolization of segment 4 branches of the left hepatic artery, which occluded flow to the target vasculature. No further flow to the targeted vessels identified on the completion angiogram.  Signed,  Dulcy Fanny. Earleen Newport, DO  [facility]  [facility]   Electronically Signed   By: Corrie Mckusick D.O.   On: 04/18/2015 17:22   Ir Angiogram Visceral Selective  04/18/2015   INDICATION: History of cirrhosis of uncertain etiology post ultrasound-guided liver biopsy on 04/17/2015, now with hemodynamic instability and postprocedural CT scan demonstrated active extravasation from the left lobe of the liver. Please perform mesenteric arteriogram and possible embolization.  Patient also with acute renal insufficiency. Please perform ultrasound fluoroscopic guided temporary dialysis catheter placement.  EXAM: 1. ULTRASOUND GUIDANCE FOR ARTERIAL ACCESS 2. CELIAC ARTERIOGRAM 3. SELECTIVE LEFT GASTRIC ARTERIOGRAM 4. SELECTIVE ACCESSORY LEFT HEPATIC ARTERIOGRAM 5.  SELECTIVE ARTERIOGRAM OF THE ANTERIOR DIVISION OF THE ACCESSORY LEFT HEPATIC ARTERY AND PERCUTANEOUS PARTICLE EMBOLIZATION 6. ULTRASOUND AND FLUOROSCOPIC GUIDED LEFT COMMON FEMORAL VEIN APPROACH TEMPORARY DIALYSIS CATHETER.  COMPARISON:  CT the abdomen pelvis - 04/18/2015; ultrasound-guided random liver biopsy - 04/17/2015  MEDICATIONS: Patient intubated and sedated with continuous monitoring via the ICU staff.  CONTRAST:  21m OMNIPAQUE IOHEXOL 300 MG/ML SOLN, 567mOMNIPAQUE IOHEXOL 300 MG/ML SOLN  FLUOROSCOPY TIME:  1 minutes 42 seconds (1,8,119Gy)  COMPLICATIONS: None immediate  TECHNIQUE: Informed written consent was obtained from the patient's father after a discussion of the risks, benefits and alternatives to treatment. Questions regarding the procedure were encouraged and answered. A timeout was performed prior to the initiation of the procedure.  The bilateral groins were prepped and draped in the usual sterile fashion, and a sterile drape was applied covering the operative field. Maximum barrier sterile technique with sterile gowns and gloves were used for the procedure. A timeout was performed prior to the initiation of the procedure. Local anesthesia was provided with 1% lidocaine.  The right femoral head was marked fluoroscopically. Under ultrasound guidance, the right common femoral artery was accessed with a micropuncture kit after the overlying soft tissues were anesthetized with 1% lidocaine. An ultrasound image  was saved for documentation purposes. The micropuncture sheath was exchanged for a 5 [place] vascular sheath over a Bentson wire. A closure arteriogram was performed through the side of the sheath confirming access within the right common femoral artery.  Over a Bentson wire, a Mickelson catheter was advanced to the level of the thoracic aorta where it was back bled and flushed. The catheter was then utilized to select the celiac artery and a celiac arteriogram was performed.  The Mickelson  catheter was retracted to select the left gastric artery and a left gastric arteriogram was performed.  With the use of a Fathom micro wire, a high-flow Renegade micro catheter was advanced into the accessory left hepatic artery and a selective accessory left hepatic arteriogram was performed.  Over an exchange length microwire a regular Renegade micro catheter was advanced into the anterior segmental branch of the accessory left hepatic artery and a selective arteriogram was performed.  Despite prolonged efforts, the micro catheter could not be advanced into the tiny branch vessel of the left hepatic artery supplying the area of persistent active extravasation. As such, particle embolization was performed of the anterior dictation of the left lateral hepatic artery with 700 to 900 micron Embospheres.  The micro catheter was retracted into the accessory left hepatic artery and a completion accessory left hepatic arteriogram was performed. All wires and catheters were removed from the patient. The right common femoral artery approach vascular sheath was secured at the right groin access site within interrupted suture and connected to a pressure bag.  Attention was now paid towards placement of the temporary dialysis catheter.  The left femoral head was marked fluoroscopically. Under direct ultrasound guidance, the left common femoral vein was accessed with a micropuncture kit. The track was dilated under intermittent fluoroscopic guidance ultimately allowing placement of a 20 cm non tunneled dialysis dialysis catheter with tip terminating within the distal aspect of the IVC. All lumens were noted to easily aspirate and flush peer and all lumens were flushed with appropriate heparin wells. Concern for dialysis catheter was secured in place within interrupted suture.  Dressings were applied. The patient tolerated the above procedure well without immediate postprocedural complication and remained hemodynamically stable  throughout the procedure.  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  Selective left gastric and accessory left hepatic arteriograms demonstrates an ill-defined area of active extravasation arising from an anterior segmental branch of the medial segment of the left lobe of the liver correlating with prior biopsy site.  Diffuse vessel spasm was noted about this distal vessel and as such, sub selective arteriogram and coil embolization could not be performed. Rather the anterior segmental vascular distribution was particle embolized with Embospheres.  Completion arteriogram demonstrates complete stasis of the anterior segmental branch of the accessory left hepatic artery with no additional areas of active extravasation.  Following ultrasound fluoroscopic guided placement, the temporary dialysis catheter tip terminates within the caudal aspect of the IVC.  IMPRESSION: 1. Technically successful particle embolization of the anterior division of the accessory left hepatic artery for active hepatic arterial extravasation. 2. Successful ultrasound fluoroscopic guided placement of a left common femoral vein approach temporary dialysis catheter with tip terminating within the caudal aspect of the IVC. The temporary dialysis catheter is ready for immediate use.   Electronically Signed   By: Jenny Reichmann  Watts M.D.   On: 04/18/2015 10:32   Ir Angiogram Selective Each Additional Vessel  04/18/2015   CLINICAL DATA:  29 year old female with a history of recent percutaneous liver biopsy for concern of cirrhosis diagnosis.  The patient subsequently developed intra abdominal hemorrhage and underwent embolization of left hepatic artery branches on the same day. Given that the patient is coagulopathic and is hypothermic, it seems as though she has  continued to bleed through the particle embolization, with low hemoglobin and downward trend of her blood pressure.  She presents for mesenteric angiogram and potential further embolization.  EXAM: MESENTERIC ANGIOGRAM, WITH ANGIOGRAM OF THE CELIAC ARTERY, LEFT GASTRIC ARTERY, LEFT HEPATIC ARTERY, AND SEGMENTAL BRANCHES OF THE LEFT HEPATIC ARTERY.  COIL EMBOLIZATION OF INFERIOR SEGMENT LEFT HEPATIC ARTERY.  ANESTHESIA/SEDATION: The patient is intubated, with management from respiratory therapy team.  2.0 Mg IV Versed; fentanyl infusion  Contrast Volume: 120 cc Omni 300  Additional Medications: 5 under mcg nitroglycerin, 2.5 mg intra-arterial verapamil  FLUOROSCOPY TIME:  29 minutes, 12 seconds Minutes.  PROCEDURE: The procedure, risks, benefits, and alternatives were explained to the patient's family. Questions regarding the procedure were encouraged and answered. The patient understands and consents to the procedure.  The indwelling right common femoral artery sheath was prepped and draped in the usual sterile fashion, as well as the right groin, which was prepped with Betadine in a sterile fashion, and a sterile drape was applied covering the operative field.  Wire was advanced through the indwelling sheath, which was then exchanged over the wire for a 35 cm 5 French sheath. This was attached to a pressure bag with heparinized saline.  A 5 [place] Mickelson catheter was then used to select the celiac artery and a celiac artery angiogram was performed with power injection.  The Mickelson catheter and a Glidewire were used to select the left gastric artery, with the [facility] the catheter advanced over the Glidewire into the proximal left gastric artery.  Left gastric artery angiogram was performed.  100 mcg of nitroglycerin were then infused intra arterial.  A renegade STC catheter was then advanced over a 014 fathom wire through the left gastric artery to select the the replaced left hepatic artery, with sub  selection of the inferior segments of the left hepatic artery, which were visualized to contribute to ongoing contrast pooling.  Sub selection of distal branches was performed with coil embolization with 2 mm and 3 mm diameter interlock coils.  Repeat angiogram of the segments demonstrated on going filling of the target vasculature, and further coil embolization of inferior segment of the left hepatic artery was required.  Gel-Foam slurry was infused after coil embolization of the downgoing/inferior segment.  Repeat angiogram demonstrated no further filling of the target vasculature.  No complications were encountered.  No significant blood loss was encountered during the procedure.  Patient remained hemodynamically stable through the procedure.  COMPLICATIONS: None.  FINDINGS: Angiogram of the celiac artery demonstrates variant anatomy of the hepatic vasculature, seen on prior study. Replaced left hepatic artery from left gastric artery again visualized, which was selected.  Angiogram demonstrates vessel spasm with attenuation of multiple distal branches, compatible with the patient's current hypovolemic shock.  Selected angiography of segments of the left hepatic artery, segment 4 branch, demonstrate ongoing contrast pooling in the similar location to the comparison study.  After coil embolization of the targeted vasculature, there is no further filling on contrast injection.  IMPRESSION: Status post mesenteric angiogram with sub selection of replaced  left hepatic artery via the left gastric artery, with coil embolization of segment 4 branches of the left hepatic artery, which occluded flow to the target vasculature. No further flow to the targeted vessels identified on the completion angiogram.  Signed,  Dulcy Fanny. Earleen Newport, DO  [facility]  [facility]   Electronically Signed   By: Corrie Mckusick D.O.   On: 04/18/2015 17:22   Ir Angiogram Selective Each Additional  Vessel  04/18/2015   CLINICAL DATA:  29 year old female with a history of recent percutaneous liver biopsy for concern of cirrhosis diagnosis.  The patient subsequently developed intra abdominal hemorrhage and underwent embolization of left hepatic artery branches on the same day. Given that the patient is coagulopathic and is hypothermic, it seems as though she has continued to bleed through the particle embolization, with low hemoglobin and downward trend of her blood pressure.  She presents for mesenteric angiogram and potential further embolization.  EXAM: MESENTERIC ANGIOGRAM, WITH ANGIOGRAM OF THE CELIAC ARTERY, LEFT GASTRIC ARTERY, LEFT HEPATIC ARTERY, AND SEGMENTAL BRANCHES OF THE LEFT HEPATIC ARTERY.  COIL EMBOLIZATION OF INFERIOR SEGMENT LEFT HEPATIC ARTERY.  ANESTHESIA/SEDATION: The patient is intubated, with management from respiratory therapy team.  2.0 Mg IV Versed; fentanyl infusion  Contrast Volume: 120 cc Omni 300  Additional Medications: 5 under mcg nitroglycerin, 2.5 mg intra-arterial verapamil  FLUOROSCOPY TIME:  29 minutes, 12 seconds Minutes.  PROCEDURE: The procedure, risks, benefits, and alternatives were explained to the patient's family. Questions regarding the procedure were encouraged and answered. The patient understands and consents to the procedure.  The indwelling right common femoral artery sheath was prepped and draped in the usual sterile fashion, as well as the right groin, which was prepped with Betadine in a sterile fashion, and a sterile drape was applied covering the operative field.  Wire was advanced through the indwelling sheath, which was then exchanged over the wire for a 35 cm 5 French sheath. This was attached to a pressure bag with heparinized saline.  A 5 [place] Mickelson catheter was then used to select the celiac artery and a celiac artery angiogram was performed with power injection.  The Mickelson catheter and a Glidewire were used to select the left gastric  artery, with the [facility] the catheter advanced over the Glidewire into the proximal left gastric artery.  Left gastric artery angiogram was performed.  100 mcg of nitroglycerin were then infused intra arterial.  A renegade STC catheter was then advanced over a 014 fathom wire through the left gastric artery to select the the replaced left hepatic artery, with sub selection of the inferior segments of the left hepatic artery, which were visualized to contribute to ongoing contrast pooling.  Sub selection of distal branches was performed with coil embolization with 2 mm and 3 mm diameter interlock coils.  Repeat angiogram of the segments demonstrated on going filling of the target vasculature, and further coil embolization of inferior segment of the left hepatic artery was required.  Gel-Foam slurry was infused after coil embolization of the downgoing/inferior segment.  Repeat angiogram demonstrated no further filling of the target vasculature.  No complications were encountered.  No significant blood loss was encountered during the procedure.  Patient remained hemodynamically stable through the procedure.  COMPLICATIONS: None.  FINDINGS: Angiogram of the celiac artery demonstrates variant anatomy of the hepatic vasculature, seen on prior study. Replaced left hepatic artery from left gastric artery again visualized, which was selected.  Angiogram demonstrates vessel spasm with  attenuation of multiple distal branches, compatible with the patient's current hypovolemic shock.  Selected angiography of segments of the left hepatic artery, segment 4 branch, demonstrate ongoing contrast pooling in the similar location to the comparison study.  After coil embolization of the targeted vasculature, there is no further filling on contrast injection.  IMPRESSION: Status post mesenteric angiogram with sub selection of replaced left hepatic artery via the left gastric artery, with coil embolization of segment 4 branches of the  left hepatic artery, which occluded flow to the target vasculature. No further flow to the targeted vessels identified on the completion angiogram.  Signed,  Dulcy Fanny. Earleen Newport, DO  [facility]  [facility]   Electronically Signed   By: Corrie Mckusick D.O.   On: 04/18/2015 17:22   Ir Angiogram Selective Each Additional Vessel  04/18/2015   INDICATION: History of cirrhosis of uncertain etiology post ultrasound-guided liver biopsy on 04/17/2015, now with hemodynamic instability and postprocedural CT scan demonstrated active extravasation from the left lobe of the liver. Please perform mesenteric arteriogram and possible embolization.  Patient also with acute renal insufficiency. Please perform ultrasound fluoroscopic guided temporary dialysis catheter placement.  EXAM: 1. ULTRASOUND GUIDANCE FOR ARTERIAL ACCESS 2. CELIAC ARTERIOGRAM 3. SELECTIVE LEFT GASTRIC ARTERIOGRAM 4. SELECTIVE ACCESSORY LEFT HEPATIC ARTERIOGRAM 5. SELECTIVE ARTERIOGRAM OF THE ANTERIOR DIVISION OF THE ACCESSORY LEFT HEPATIC ARTERY AND PERCUTANEOUS PARTICLE EMBOLIZATION 6. ULTRASOUND AND FLUOROSCOPIC GUIDED LEFT COMMON FEMORAL VEIN APPROACH TEMPORARY DIALYSIS CATHETER.  COMPARISON:  CT the abdomen pelvis - 04/18/2015; ultrasound-guided random liver biopsy - 04/17/2015  MEDICATIONS: Patient intubated and sedated with continuous monitoring via the ICU staff.  CONTRAST:  73mL OMNIPAQUE IOHEXOL 300 MG/ML SOLN, 24mL OMNIPAQUE IOHEXOL 300 MG/ML SOLN  FLUOROSCOPY TIME:  1 minutes 42 seconds (7,412 mGy)  COMPLICATIONS: None immediate  TECHNIQUE: Informed written consent was obtained from the patient's father after a discussion of the risks, benefits and alternatives to treatment. Questions regarding the procedure were encouraged and answered. A timeout was performed prior to the initiation of the procedure.  The bilateral groins were prepped and draped in the usual sterile fashion, and a sterile drape was  applied covering the operative field. Maximum barrier sterile technique with sterile gowns and gloves were used for the procedure. A timeout was performed prior to the initiation of the procedure. Local anesthesia was provided with 1% lidocaine.  The right femoral head was marked fluoroscopically. Under ultrasound guidance, the right common femoral artery was accessed with a micropuncture kit after the overlying soft tissues were anesthetized with 1% lidocaine. An ultrasound image was saved for documentation purposes. The micropuncture sheath was exchanged for a 5 [place] vascular sheath over a Bentson wire. A closure arteriogram was performed through the side of the sheath confirming access within the right common femoral artery.  Over a Bentson wire, a Mickelson catheter was advanced to the level of the thoracic aorta where it was back bled and flushed. The catheter was then utilized to select the celiac artery and a celiac arteriogram was performed.  The Mickelson catheter was retracted to select the left gastric artery and a left gastric arteriogram was performed.  With the use of a Fathom micro wire, a high-flow Renegade micro catheter was advanced into the accessory left hepatic artery and a selective accessory left hepatic arteriogram was performed.  Over an exchange length microwire a regular Renegade micro catheter was advanced into the anterior segmental branch of the accessory left hepatic artery and a selective arteriogram  was performed.  Despite prolonged efforts, the micro catheter could not be advanced into the tiny branch vessel of the left hepatic artery supplying the area of persistent active extravasation. As such, particle embolization was performed of the anterior dictation of the left lateral hepatic artery with 700 to 900 micron Embospheres.  The micro catheter was retracted into the accessory left hepatic artery and a completion accessory left hepatic arteriogram was performed. All wires and  catheters were removed from the patient. The right common femoral artery approach vascular sheath was secured at the right groin access site within interrupted suture and connected to a pressure bag.  Attention was now paid towards placement of the temporary dialysis catheter.  The left femoral head was marked fluoroscopically. Under direct ultrasound guidance, the left common femoral vein was accessed with a micropuncture kit. The track was dilated under intermittent fluoroscopic guidance ultimately allowing placement of a 20 cm non tunneled dialysis dialysis catheter with tip terminating within the distal aspect of the IVC. All lumens were noted to easily aspirate and flush peer and all lumens were flushed with appropriate heparin wells. Concern for dialysis catheter was secured in place within interrupted suture.  Dressings were applied. The patient tolerated the above procedure well without immediate postprocedural complication and remained hemodynamically stable throughout the procedure.  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  Selective left gastric and accessory left hepatic arteriograms demonstrates an ill-defined area of active extravasation arising from an anterior segmental branch of the medial segment of the left lobe of the liver correlating with prior biopsy site.  Diffuse vessel spasm was noted about this distal vessel and as such, sub selective arteriogram and coil embolization could not be performed. Rather the anterior segmental vascular distribution was particle embolized with Embospheres.  Completion arteriogram demonstrates complete stasis of the anterior segmental branch of the accessory left hepatic artery with no additional areas of active extravasation.  Following ultrasound fluoroscopic guided  placement, the temporary dialysis catheter tip terminates within the caudal aspect of the IVC.  IMPRESSION: 1. Technically successful particle embolization of the anterior division of the accessory left hepatic artery for active hepatic arterial extravasation. 2. Successful ultrasound fluoroscopic guided placement of a left common femoral vein approach temporary dialysis catheter with tip terminating within the caudal aspect of the IVC. The temporary dialysis catheter is ready for immediate use.   Electronically Signed   By: Sandi Mariscal M.D.   On: 04/18/2015 10:32   Ir Angiogram Selective Each Additional Vessel  04/18/2015   INDICATION: History of cirrhosis of uncertain etiology post ultrasound-guided liver biopsy on 04/17/2015, now with hemodynamic instability and postprocedural CT scan demonstrated active extravasation from the left lobe of the liver. Please perform mesenteric arteriogram and possible embolization.  Patient also with acute renal insufficiency. Please perform ultrasound fluoroscopic guided temporary dialysis catheter placement.  EXAM: 1. ULTRASOUND GUIDANCE FOR ARTERIAL ACCESS 2. CELIAC ARTERIOGRAM 3. SELECTIVE LEFT GASTRIC ARTERIOGRAM 4. SELECTIVE ACCESSORY LEFT HEPATIC ARTERIOGRAM 5. SELECTIVE ARTERIOGRAM OF THE ANTERIOR DIVISION OF THE ACCESSORY LEFT HEPATIC ARTERY AND PERCUTANEOUS PARTICLE EMBOLIZATION 6. ULTRASOUND AND FLUOROSCOPIC GUIDED LEFT COMMON FEMORAL VEIN APPROACH TEMPORARY DIALYSIS CATHETER.  COMPARISON:  CT the abdomen pelvis - 04/18/2015; ultrasound-guided random liver biopsy - 04/17/2015  MEDICATIONS: Patient intubated and sedated with continuous monitoring via the ICU staff.  CONTRAST:  54m OMNIPAQUE IOHEXOL 300 MG/ML SOLN, 542mOMNIPAQUE IOHEXOL 300 MG/ML SOLN  FLUOROSCOPY TIME:  1 minutes 42 seconds (1,1,950Gy)  COMPLICATIONS: None immediate  TECHNIQUE: Informed written consent was obtained from the patient's father after a discussion of the risks, benefits and alternatives  to treatment. Questions regarding the procedure were encouraged and answered. A timeout was performed prior to the initiation of the procedure.  The bilateral groins were prepped and draped in the usual sterile fashion, and a sterile drape was applied covering the operative field. Maximum barrier sterile technique with sterile gowns and gloves were used for the procedure. A timeout was performed prior to the initiation of the procedure. Local anesthesia was provided with 1% lidocaine.  The right femoral head was marked fluoroscopically. Under ultrasound guidance, the right common femoral artery was accessed with a micropuncture kit after the overlying soft tissues were anesthetized with 1% lidocaine. An ultrasound image was saved for documentation purposes. The micropuncture sheath was exchanged for a 5 [place] sheath over a Bentson wire. A closure arteriogram was performed through the side of the sheath confirming access within the right common femoral artery.  Over a Bentson wire, a Mickelson catheter was advanced to the level of the thoracic aorta where it was back bled and flushed. The catheter was then utilized to select the celiac artery and a celiac arteriogram was performed.  The Mickelson catheter was retracted to select the left gastric artery and a left gastric arteriogram was performed.  With the use of a Fathom micro wire, a high-flow Renegade micro catheter was advanced into the accessory left hepatic artery and a selective accessory left hepatic arteriogram was performed.  Over an exchange length microwire a regular Renegade micro catheter was advanced into the anterior segmental branch of the accessory left hepatic artery and a selective arteriogram was performed.  Despite prolonged efforts, the micro catheter could not be advanced into the tiny branch vessel of the left hepatic artery supplying the area of persistent active extravasation. As such, particle embolization was performed of the  anterior dictation of the left lateral hepatic artery with 700 to 900 micron Embospheres.  The micro catheter was retracted into the accessory left hepatic artery and a completion accessory left hepatic arteriogram was performed. All wires and catheters were removed from the patient. The right common femoral artery approach vascular sheath was secured at the right groin access site within interrupted suture and connected to a pressure bag.  Attention was now paid towards placement of the temporary dialysis catheter.  The left femoral head was marked fluoroscopically. Under direct ultrasound guidance, the left common femoral vein was accessed with a micropuncture kit. The track was dilated under intermittent fluoroscopic guidance ultimately allowing placement of a 20 cm non tunneled dialysis dialysis catheter with tip terminating within the distal aspect of the IVC. All lumens were noted to easily aspirate and flush peer and all lumens were flushed with appropriate heparin wells. Concern for dialysis catheter was secured in place within interrupted suture.  Dressings were applied. The patient tolerated the above procedure well without immediate postprocedural complication and remained hemodynamically stable throughout the procedure.  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  Selective left gastric and accessory left hepatic arteriograms demonstrates an ill-defined area of active extravasation arising from an anterior segmental branch of  the medial segment of the left lobe of the liver correlating with prior biopsy site.  Diffuse vessel spasm was noted about this distal vessel and as such, sub selective arteriogram and coil embolization could not be performed. Rather the anterior segmental vascular distribution was particle  embolized with Embospheres.  Completion arteriogram demonstrates complete stasis of the anterior segmental branch of the accessory left hepatic artery with no additional areas of active extravasation.  Following ultrasound fluoroscopic guided placement, the temporary dialysis catheter tip terminates within the caudal aspect of the IVC.  IMPRESSION: 1. Technically successful particle embolization of the anterior division of the accessory left hepatic artery for active hepatic arterial extravasation. 2. Successful ultrasound fluoroscopic guided placement of a left common femoral vein approach temporary dialysis catheter with tip terminating within the caudal aspect of the IVC. The temporary dialysis catheter is ready for immediate use.   Electronically Signed   By: Sandi Mariscal M.D.   On: 04/18/2015 10:32   US Biopsy  04/17/2015   CLINICAL DATA:  29 year old with sickle cell anemia and cirrhosis. Request for liver biopsy.  EXAM: ULTRASOUND GUIDED LIVER BIOPSY  Physician: Stephan Minister. Anselm Pancoast, MD  FLUOROSCOPY TIME:  None  MEDICATIONS: 3 mg Versed, 75 mcg fentanyl. A radiology nurse monitored the patient for moderate sedation.  ANESTHESIA/SEDATION: Moderate sedation time: 20 minutes  PROCEDURE: The procedure was explained to the patient. The risks and benefits of the procedure were discussed and the patient's questions were addressed. Informed consent was obtained from the patient. The liver was evaluated with ultrasound. Left hepatic lobe was selected for biopsy. The anterior abdomen was prepped and draped in sterile fashion. 1% lidocaine was used for local anesthetic. Using ultrasound guidance, 17 gauge needle was directed into the left hepatic lobe. Three core biopsies were obtained with an 18 gauge core device. The tract was embolized with Gel-Foam. Bandage placed over the puncture site.  FINDINGS: The liver has a nodular contour. Small amount of ascites along the right side of the liver. Left hepatic lobe was selected  for biopsy because there was no adjacent ascites. No evidence for bleeding or hematoma following the core biopsies.  Estimated blood loss: Minimal  COMPLICATIONS: None  IMPRESSION: Ultrasound-guided core biopsies of the left hepatic lobe.   Electronically Signed   By: Markus Daft M.D.   On: 04/17/2015 13:02   Ir Fluoro Guide Cv Line Left  04/18/2015   INDICATION: History of cirrhosis of uncertain etiology post ultrasound-guided liver biopsy on 04/17/2015, now with hemodynamic instability and postprocedural CT scan demonstrated active extravasation from the left lobe of the liver. Please perform mesenteric arteriogram and possible embolization.  Patient also with acute renal insufficiency. Please perform ultrasound fluoroscopic guided temporary dialysis catheter placement.  EXAM: 1. ULTRASOUND GUIDANCE FOR ARTERIAL ACCESS 2. CELIAC ARTERIOGRAM 3. SELECTIVE LEFT GASTRIC ARTERIOGRAM 4. SELECTIVE ACCESSORY LEFT HEPATIC ARTERIOGRAM 5. SELECTIVE ARTERIOGRAM OF THE ANTERIOR DIVISION OF THE ACCESSORY LEFT HEPATIC ARTERY AND PERCUTANEOUS PARTICLE EMBOLIZATION 6. ULTRASOUND AND FLUOROSCOPIC GUIDED LEFT COMMON FEMORAL VEIN APPROACH TEMPORARY DIALYSIS CATHETER.  COMPARISON:  CT the abdomen pelvis - 04/18/2015; ultrasound-guided random liver biopsy - 04/17/2015  MEDICATIONS: Patient intubated and sedated with continuous monitoring via the ICU staff.  CONTRAST:  11m OMNIPAQUE IOHEXOL 300 MG/ML SOLN, 598mOMNIPAQUE IOHEXOL 300 MG/ML SOLN  FLUOROSCOPY TIME:  1 minutes 42 seconds (1,6,967Gy)  COMPLICATIONS: None immediate  TECHNIQUE: Informed written consent was obtained from the patient's father after a discussion of the risks, benefits and alternatives to treatment.  Questions regarding the procedure were encouraged and answered. A timeout was performed prior to the initiation of the procedure.  The bilateral groins were prepped and draped in the usual sterile fashion, and a sterile drape was applied covering the operative  field. Maximum barrier sterile technique with sterile gowns and gloves were used for the procedure. A timeout was performed prior to the initiation of the procedure. Local anesthesia was provided with 1% lidocaine.  The right femoral head was marked fluoroscopically. Under ultrasound guidance, the right common femoral artery was accessed with a micropuncture kit after the overlying soft tissues were anesthetized with 1% lidocaine. An ultrasound image was saved for documentation purposes. The micropuncture sheath was exchanged for a 5 [place] vascular sheath over a Bentson wire. A closure arteriogram was performed through the side of the sheath confirming access within the right common femoral artery.  Over a Bentson wire, a Mickelson catheter was advanced to the level of the thoracic aorta where it was back bled and flushed. The catheter was then utilized to select the celiac artery and a celiac arteriogram was performed.  The Mickelson catheter was retracted to select the left gastric artery and a left gastric arteriogram was performed.  With the use of a Fathom micro wire, a high-flow Renegade micro catheter was advanced into the accessory left hepatic artery and a selective accessory left hepatic arteriogram was performed.  Over an exchange length microwire a regular Renegade micro catheter was advanced into the anterior segmental branch of the accessory left hepatic artery and a selective arteriogram was performed.  Despite prolonged efforts, the micro catheter could not be advanced into the tiny branch vessel of the left hepatic artery supplying the area of persistent active extravasation. As such, particle embolization was performed of the anterior dictation of the left lateral hepatic artery with 700 to 900 micron Embospheres.  The micro catheter was retracted into the accessory left hepatic artery and a completion accessory left hepatic arteriogram was performed. All wires and catheters were removed from the  patient. The right common femoral artery approach vascular sheath was secured at the right groin access site within interrupted suture and connected to a pressure bag.  Attention was now paid towards placement of the temporary dialysis catheter.  The left femoral head was marked fluoroscopically. Under direct ultrasound guidance, the left common femoral vein was accessed with a micropuncture kit. The track was dilated under intermittent fluoroscopic guidance ultimately allowing placement of a 20 cm non tunneled dialysis dialysis catheter with tip terminating within the distal aspect of the IVC. All lumens were noted to easily aspirate and flush peer and all lumens were flushed with appropriate heparin wells. Concern for dialysis catheter was secured in place within interrupted suture.  Dressings were applied. The patient tolerated the above procedure well without immediate postprocedural complication and remained hemodynamically stable throughout the procedure.  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  Selective left gastric and accessory left hepatic arteriograms demonstrates an ill-defined area of active extravasation arising from an anterior segmental branch of the medial segment of the left lobe of the liver correlating with prior biopsy site.  Diffuse vessel spasm was noted about this distal vessel and as such, sub selective arteriogram and coil embolization could not be performed. Rather the anterior segmental vascular distribution was particle embolized with  Embospheres.  Completion arteriogram demonstrates complete stasis of the anterior segmental branch of the accessory left hepatic artery with no additional areas of active extravasation.  Following ultrasound fluoroscopic guided placement, the temporary dialysis  catheter tip terminates within the caudal aspect of the IVC.  IMPRESSION: 1. Technically successful particle embolization of the anterior division of the accessory left hepatic artery for active hepatic arterial extravasation. 2. Successful ultrasound fluoroscopic guided placement of a left common femoral vein approach temporary dialysis catheter with tip terminating within the caudal aspect of the IVC. The temporary dialysis catheter is ready for immediate use.   Electronically Signed   By: Sandi Mariscal M.D.   On: 04/18/2015 10:32   Ir US Guide Vasc Access Left  04/18/2015   INDICATION: History of cirrhosis of uncertain etiology post ultrasound-guided liver biopsy on 04/17/2015, now with hemodynamic instability and postprocedural CT scan demonstrated active extravasation from the left lobe of the liver. Please perform mesenteric arteriogram and possible embolization.  Patient also with acute renal insufficiency. Please perform ultrasound fluoroscopic guided temporary dialysis catheter placement.  EXAM: 1. ULTRASOUND GUIDANCE FOR ARTERIAL ACCESS 2. CELIAC ARTERIOGRAM 3. SELECTIVE LEFT GASTRIC ARTERIOGRAM 4. SELECTIVE ACCESSORY LEFT HEPATIC ARTERIOGRAM 5. SELECTIVE ARTERIOGRAM OF THE ANTERIOR DIVISION OF THE ACCESSORY LEFT HEPATIC ARTERY AND PERCUTANEOUS PARTICLE EMBOLIZATION 6. ULTRASOUND AND FLUOROSCOPIC GUIDED LEFT COMMON FEMORAL VEIN APPROACH TEMPORARY DIALYSIS CATHETER.  COMPARISON:  CT the abdomen pelvis - 04/18/2015; ultrasound-guided random liver biopsy - 04/17/2015  MEDICATIONS: Patient intubated and sedated with continuous monitoring via the ICU staff.  CONTRAST:  69m OMNIPAQUE IOHEXOL 300 MG/ML SOLN, 575mOMNIPAQUE IOHEXOL 300 MG/ML SOLN  FLUOROSCOPY TIME:  1 minutes 42 seconds (1,9,371Gy)  COMPLICATIONS: None immediate  TECHNIQUE: Informed written consent was obtained from the patient's father after a discussion of the risks, benefits and alternatives to treatment. Questions regarding the procedure  were encouraged and answered. A timeout was performed prior to the initiation of the procedure.  The bilateral groins were prepped and draped in the usual sterile fashion, and a sterile drape was applied covering the operative field. Maximum barrier sterile technique with sterile gowns and gloves were used for the procedure. A timeout was performed prior to the initiation of the procedure. Local anesthesia was provided with 1% lidocaine.  The right femoral head was marked fluoroscopically. Under ultrasound guidance, the right common femoral artery was accessed with a micropuncture kit after the overlying soft tissues were anesthetized with 1% lidocaine. An ultrasound image was saved for documentation purposes. The micropuncture sheath was exchanged for a 5 [place] sheath over a Bentson wire. A closure arteriogram was performed through the side of the sheath confirming access within the right common femoral artery.  Over a Bentson wire, a Mickelson catheter was advanced to the level of the thoracic aorta where it was back bled and flushed. The catheter was then utilized to select the celiac artery and a celiac arteriogram was performed.  The Mickelson catheter was retracted to select the left gastric artery and a left gastric arteriogram was performed.  With the use of a Fathom micro wire, a high-flow Renegade micro catheter was advanced into the accessory left hepatic artery and a selective accessory left hepatic arteriogram was performed.  Over an exchange length microwire a regular Renegade micro catheter was advanced into the anterior segmental branch of the accessory left hepatic artery and a selective arteriogram was performed.  Despite prolonged efforts, the micro catheter could not be advanced into the tiny branch vessel  of the left hepatic artery supplying the area of persistent active extravasation. As such, particle embolization was performed of the anterior dictation of the left lateral hepatic  artery with 700 to 900 micron Embospheres.  The micro catheter was retracted into the accessory left hepatic artery and a completion accessory left hepatic arteriogram was performed. All wires and catheters were removed from the patient. The right common femoral artery approach vascular sheath was secured at the right groin access site within interrupted suture and connected to a pressure bag.  Attention was now paid towards placement of the temporary dialysis catheter.  The left femoral head was marked fluoroscopically. Under direct ultrasound guidance, the left common femoral vein was accessed with a micropuncture kit. The track was dilated under intermittent fluoroscopic guidance ultimately allowing placement of a 20 cm non tunneled dialysis dialysis catheter with tip terminating within the distal aspect of the IVC. All lumens were noted to easily aspirate and flush peer and all lumens were flushed with appropriate heparin wells. Concern for dialysis catheter was secured in place within interrupted suture.  Dressings were applied. The patient tolerated the above procedure well without immediate postprocedural complication and remained hemodynamically stable throughout the procedure.  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  Selective left gastric and accessory left hepatic arteriograms demonstrates an ill-defined area of active extravasation arising from an anterior segmental branch of the medial segment of the left lobe of the liver correlating with prior biopsy site.  Diffuse vessel spasm was noted about this distal vessel and as such, sub selective arteriogram and coil embolization could not be performed. Rather the anterior segmental vascular distribution was particle embolized with Embospheres.  Completion arteriogram  demonstrates complete stasis of the anterior segmental branch of the accessory left hepatic artery with no additional areas of active extravasation.  Following ultrasound fluoroscopic guided placement, the temporary dialysis catheter tip terminates within the caudal aspect of the IVC.  IMPRESSION: 1. Technically successful particle embolization of the anterior division of the accessory left hepatic artery for active hepatic arterial extravasation. 2. Successful ultrasound fluoroscopic guided placement of a left common femoral vein approach temporary dialysis catheter with tip terminating within the caudal aspect of the IVC. The temporary dialysis catheter is ready for immediate use.   Electronically Signed   By: Sandi Mariscal M.D.   On: 04/18/2015 10:32   Ir US Guide Vasc Access Right  04/18/2015   INDICATION: History of cirrhosis of uncertain etiology post ultrasound-guided liver biopsy on 04/17/2015, now with hemodynamic instability and postprocedural CT scan demonstrated active extravasation from the left lobe of the liver. Please perform mesenteric arteriogram and possible embolization.  Patient also with acute renal insufficiency. Please perform ultrasound fluoroscopic guided temporary dialysis catheter placement.  EXAM: 1. ULTRASOUND GUIDANCE FOR ARTERIAL ACCESS 2. CELIAC ARTERIOGRAM 3. SELECTIVE LEFT GASTRIC ARTERIOGRAM 4. SELECTIVE ACCESSORY LEFT HEPATIC ARTERIOGRAM 5. SELECTIVE ARTERIOGRAM OF THE ANTERIOR DIVISION OF THE ACCESSORY LEFT HEPATIC ARTERY AND PERCUTANEOUS PARTICLE EMBOLIZATION 6. ULTRASOUND AND FLUOROSCOPIC GUIDED LEFT COMMON FEMORAL VEIN APPROACH TEMPORARY DIALYSIS CATHETER.  COMPARISON:  CT the abdomen pelvis - 04/18/2015; ultrasound-guided random liver biopsy - 04/17/2015  MEDICATIONS: Patient intubated and sedated with continuous monitoring via the ICU staff.  CONTRAST:  80m OMNIPAQUE IOHEXOL 300 MG/ML SOLN, 518mOMNIPAQUE IOHEXOL 300 MG/ML SOLN  FLUOROSCOPY TIME:  1 minutes  42 seconds  (9,798 mGy)  COMPLICATIONS: None immediate  TECHNIQUE: Informed written consent was obtained from the patient's father after a discussion of the risks, benefits and alternatives to treatment. Questions regarding the procedure were encouraged and answered. A timeout was performed prior to the initiation of the procedure.  The bilateral groins were prepped and draped in the usual sterile fashion, and a sterile drape was applied covering the operative field. Maximum barrier sterile technique with sterile gowns and gloves were used for the procedure. A timeout was performed prior to the initiation of the procedure. Local anesthesia was provided with 1% lidocaine.  The right femoral head was marked fluoroscopically. Under ultrasound guidance, the right common femoral artery was accessed with a micropuncture kit after the overlying soft tissues were anesthetized with 1% lidocaine. An ultrasound image was saved for documentation purposes. The micropuncture sheath was exchanged for a 5 [place] vascular sheath over a Bentson wire. A closure arteriogram was performed through the side of the sheath confirming access within the right common femoral artery.  Over a Bentson wire, a Mickelson catheter was advanced to the level of the thoracic aorta where it was back bled and flushed. The catheter was then utilized to select the celiac artery and a celiac arteriogram was performed.  The Mickelson catheter was retracted to select the left gastric artery and a left gastric arteriogram was performed.  With the use of a Fathom micro wire, a high-flow Renegade micro catheter was advanced into the accessory left hepatic artery and a selective accessory left hepatic arteriogram was performed.  Over an exchange length microwire a regular Renegade micro catheter was advanced into the anterior segmental branch of the accessory left hepatic artery and a selective arteriogram was performed.  Despite prolonged efforts, the micro catheter could  not be advanced into the tiny branch vessel of the left hepatic artery supplying the area of persistent active extravasation. As such, particle embolization was performed of the anterior dictation of the left lateral hepatic artery with 700 to 900 micron Embospheres.  The micro catheter was retracted into the accessory left hepatic artery and a completion accessory left hepatic arteriogram was performed. All wires and catheters were removed from the patient. The right common femoral artery approach vascular sheath was secured at the right groin access site within interrupted suture and connected to a pressure bag.  Attention was now paid towards placement of the temporary dialysis catheter.  The left femoral head was marked fluoroscopically. Under direct ultrasound guidance, the left common femoral vein was accessed with a micropuncture kit. The track was dilated under intermittent fluoroscopic guidance ultimately allowing placement of a 20 cm non tunneled dialysis dialysis catheter with tip terminating within the distal aspect of the IVC. All lumens were noted to easily aspirate and flush peer and all lumens were flushed with appropriate heparin wells. Concern for dialysis catheter was secured in place within interrupted suture.  Dressings were applied. The patient tolerated the above procedure well without immediate postprocedural complication and remained hemodynamically stable throughout the procedure.  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  Selective left gastric and accessory left hepatic arteriograms demonstrates an ill-defined area of active extravasation arising from an anterior segmental branch of the medial segment of the left lobe of the liver correlating with prior biopsy site.  Diffuse vessel  spasm was noted  about this distal vessel and as such, sub selective arteriogram and coil embolization could not be performed. Rather the anterior segmental vascular distribution was particle embolized with Embospheres.  Completion arteriogram demonstrates complete stasis of the anterior segmental branch of the accessory left hepatic artery with no additional areas of active extravasation.  Following ultrasound fluoroscopic guided placement, the temporary dialysis catheter tip terminates within the caudal aspect of the IVC.  IMPRESSION: 1. Technically successful particle embolization of the anterior division of the accessory left hepatic artery for active hepatic arterial extravasation. 2. Successful ultrasound fluoroscopic guided placement of a left common femoral vein approach temporary dialysis catheter with tip terminating within the caudal aspect of the IVC. The temporary dialysis catheter is ready for immediate use.   Electronically Signed   By: Sandi Mariscal M.D.   On: 04/18/2015 10:32   Dg Chest Port 1 View  04/19/2015   CLINICAL DATA:  Respiratory failure.  Endotracheal intubation.  EXAM: PORTABLE CHEST - 1 VIEW  COMPARISON:  04/17/2015  FINDINGS: The endotracheal tube is 1.7 cm above the carina. The nasogastric tube extends into the stomach. The right jugular central line extends into the right atrium. There is no pneumothorax. There is no large effusion. There is unchanged cardiomegaly. There is no confluent airspace consolidation.  IMPRESSION: Support equipment appears satisfactorily positioned. The lungs are grossly clear.   Electronically Signed   By: Andreas Newport M.D.   On: 04/19/2015 06:32   Dg Chest Port 1 View  04/18/2015   CLINICAL DATA:  Endotracheal tube and central line placements  EXAM: PORTABLE CHEST - 1 VIEW  COMPARISON:  04/17/2015  FINDINGS: The endotracheal tube tip is 1.5 cm above the carina. The nasogastric tube extends into the stomach. There is a left jugular central line with tip in the  cavoatrial junction. There is no pneumothorax. There is no dense airspace consolidation. There is no large effusion.  IMPRESSION: Support equipment appears satisfactorily positioned.   Electronically Signed   By: Andreas Newport M.D.   On: 04/18/2015 00:40   Dg Chest Port 1 View  04/17/2015   CLINICAL DATA:  Shortness of Breath  EXAM: PORTABLE CHEST - 1 VIEW  COMPARISON:  April 03, 2015  FINDINGS: Degree of inspiration is shallow. There is no edema or consolidation. Heart size and pulmonary vascularity are within normal limits given the degree of shallow inspiratory effort. No adenopathy. No bone lesions. No pneumothorax.  IMPRESSION: No edema or consolidation.  Note shallow degree of inspiration.   Electronically Signed   By: Lowella Grip III M.D.   On: 04/17/2015 20:21   Dg Abd Portable 1v  04/17/2015   CLINICAL DATA:  29 year old female abdominal pain and distention x2 weeks.  EXAM: PORTABLE ABDOMEN - 1 VIEW  COMPARISON:  Abdominal ultrasound dated 03/30/2015 and MRI dated 04/12/2015.  FINDINGS: This is a nonspecific bowel gas pattern. Moderate stool noted throughout the colon. Right upper quadrant cholecystectomy clips. The osseous structures appear unremarkable.  IMPRESSION: Nonspecific bowel gas pattern without definite evidence of obstruction.   Electronically Signed   By: Anner Crete M.D.   On: 04/17/2015 20:26   Ir Embo Arterial Not [place]  04/18/2015   CLINICAL DATA:  29 year old female with a history of recent percutaneous liver biopsy for concern of cirrhosis diagnosis.  The patient subsequently developed intra abdominal hemorrhage and underwent embolization of left hepatic artery branches on the same day. Given that the patient is coagulopathic  and is hypothermic, it seems as though she has continued to bleed through the particle embolization, with low hemoglobin and downward trend of her blood pressure.  She presents for mesenteric angiogram and  potential further embolization.  EXAM: MESENTERIC ANGIOGRAM, WITH ANGIOGRAM OF THE CELIAC ARTERY, LEFT GASTRIC ARTERY, LEFT HEPATIC ARTERY, AND SEGMENTAL BRANCHES OF THE LEFT HEPATIC ARTERY.  COIL EMBOLIZATION OF INFERIOR SEGMENT LEFT HEPATIC ARTERY.  ANESTHESIA/SEDATION: The patient is intubated, with management from respiratory therapy team.  2.0 Mg IV Versed; fentanyl infusion  Contrast Volume: 120 cc Omni 300  Additional Medications: 5 under mcg nitroglycerin, 2.5 mg intra-arterial verapamil  FLUOROSCOPY TIME:  29 minutes, 12 seconds Minutes.  PROCEDURE: The procedure, risks, benefits, and alternatives were explained to the patient's family. Questions regarding the procedure were encouraged and answered. The patient understands and consents to the procedure.  The indwelling right common femoral artery sheath was prepped and draped in the usual sterile fashion, as well as the right groin, which was prepped with Betadine in a sterile fashion, and a sterile drape was applied covering the operative field.  Wire was advanced through the indwelling sheath, which was then exchanged over the wire for a 35 cm 5 French sheath. This was attached to a pressure bag with heparinized saline.  A 5 [place] Mickelson catheter was then used to select the celiac artery and a celiac artery angiogram was performed with power injection.  The Mickelson catheter and a Glidewire were used to select the left gastric artery, with the [facility] the catheter advanced over the Glidewire into the proximal left gastric artery.  Left gastric artery angiogram was performed.  100 mcg of nitroglycerin were then infused intra arterial.  A renegade STC catheter was then advanced over a 014 fathom wire through the left gastric artery to select the the replaced left hepatic artery, with sub selection of the inferior segments of the left hepatic artery, which were visualized to contribute to ongoing contrast pooling.  Sub selection of distal branches was  performed with coil embolization with 2 mm and 3 mm diameter interlock coils.  Repeat angiogram of the segments demonstrated on going filling of the target vasculature, and further coil embolization of inferior segment of the left hepatic artery was required.  Gel-Foam slurry was infused after coil embolization of the downgoing/inferior segment.  Repeat angiogram demonstrated no further filling of the target vasculature.  No complications were encountered.  No significant blood loss was encountered during the procedure.  Patient remained hemodynamically stable through the procedure.  COMPLICATIONS: None.  FINDINGS: Angiogram of the celiac artery demonstrates variant anatomy of the hepatic vasculature, seen on prior study. Replaced left hepatic artery from left gastric artery again visualized, which was selected.  Angiogram demonstrates vessel spasm with attenuation of multiple distal branches, compatible with the patient's current hypovolemic shock.  Selected angiography of segments of the left hepatic artery, segment 4 branch, demonstrate ongoing contrast pooling in the similar location to the comparison study.  After coil embolization of the targeted vasculature, there is no further filling on contrast injection.  IMPRESSION: Status post mesenteric angiogram with sub selection of replaced left hepatic artery via the left gastric artery, with coil embolization of segment 4 branches of the left hepatic artery, which occluded flow to the target vasculature. No further flow to the targeted vessels identified on the completion angiogram.  Signed,  Dulcy Fanny. Earleen Newport, DO  [facility]  [facility]   Electronically Signed   By: York Cerise  Earleen Newport D.O.   On: 04/18/2015 17:22   [place] Guide Roadmapping  04/18/2015   INDICATION: History of cirrhosis of uncertain etiology post ultrasound-guided liver biopsy on 04/17/2015, now with hemodynamic  instability and postprocedural CT scan demonstrated active extravasation from the left lobe of the liver. Please perform mesenteric arteriogram and possible embolization.  Patient also with acute renal insufficiency. Please perform ultrasound fluoroscopic guided temporary dialysis catheter placement.  EXAM: 1. ULTRASOUND GUIDANCE FOR ARTERIAL ACCESS 2. CELIAC ARTERIOGRAM 3. SELECTIVE LEFT GASTRIC ARTERIOGRAM 4. SELECTIVE ACCESSORY LEFT HEPATIC ARTERIOGRAM 5. SELECTIVE ARTERIOGRAM OF THE ANTERIOR DIVISION OF THE ACCESSORY LEFT HEPATIC ARTERY AND PERCUTANEOUS PARTICLE EMBOLIZATION 6. ULTRASOUND AND FLUOROSCOPIC GUIDED LEFT COMMON FEMORAL VEIN APPROACH TEMPORARY DIALYSIS CATHETER.  COMPARISON:  CT the abdomen pelvis - 04/18/2015; ultrasound-guided random liver biopsy - 04/17/2015  MEDICATIONS: Patient intubated and sedated with continuous monitoring via the ICU staff.  CONTRAST:  21m OMNIPAQUE IOHEXOL 300 MG/ML SOLN, 566mOMNIPAQUE IOHEXOL 300 MG/ML SOLN  FLUOROSCOPY TIME:  1 minutes 42 seconds (1,2,637Gy)  COMPLICATIONS: None immediate  TECHNIQUE: Informed written consent was obtained from the patient's father after a discussion of the risks, benefits and alternatives to treatment. Questions regarding the procedure were encouraged and answered. A timeout was performed prior to the initiation of the procedure.  The bilateral groins were prepped and draped in the usual sterile fashion, and a sterile drape was applied covering the operative field. Maximum barrier sterile technique with sterile gowns and gloves were used for the procedure. A timeout was performed prior to the initiation of the procedure. Local anesthesia was provided with 1% lidocaine.  The right femoral head was marked fluoroscopically. Under ultrasound guidance, the right common femoral artery was accessed with a micropuncture kit after the overlying soft tissues were anesthetized with 1% lidocaine. An ultrasound image was saved for documentation  purposes. The micropuncture sheath was exchanged for a 5 [place] sheath over a Bentson wire. A closure arteriogram was performed through the side of the sheath confirming access within the right common femoral artery.  Over a Bentson wire, a Mickelson catheter was advanced to the level of the thoracic aorta where it was back bled and flushed. The catheter was then utilized to select the celiac artery and a celiac arteriogram was performed.  The Mickelson catheter was retracted to select the left gastric artery and a left gastric arteriogram was performed.  With the use of a Fathom micro wire, a high-flow Renegade micro catheter was advanced into the accessory left hepatic artery and a selective accessory left hepatic arteriogram was performed.  Over an exchange length microwire a regular Renegade micro catheter was advanced into the anterior segmental branch of the accessory left hepatic artery and a selective arteriogram was performed.  Despite prolonged efforts, the micro catheter could not be advanced into the tiny branch vessel of the left hepatic artery supplying the area of persistent active extravasation. As such, particle embolization was performed of the anterior dictation of the left lateral hepatic artery with 700 to 900 micron Embospheres.  The micro catheter was retracted into the accessory left hepatic artery and a completion accessory left hepatic arteriogram was performed. All wires and catheters were removed from the patient. The right common femoral artery approach vascular sheath was secured at the right groin access site within interrupted suture and connected to a pressure bag.  Attention was now paid towards placement of the temporary dialysis catheter.  The left  femoral head was marked fluoroscopically. Under direct ultrasound guidance, the left common femoral vein was accessed with a micropuncture kit. The track was dilated under intermittent fluoroscopic guidance ultimately allowing  placement of a 20 cm non tunneled dialysis dialysis catheter with tip terminating within the distal aspect of the IVC. All lumens were noted to easily aspirate and flush peer and all lumens were flushed with appropriate heparin wells. Concern for dialysis catheter was secured in place within interrupted suture.  Dressings were applied. The patient tolerated the above procedure well without immediate postprocedural complication and remained hemodynamically stable throughout the procedure.  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  Selective left gastric and accessory left hepatic arteriograms demonstrates an ill-defined area of active extravasation arising from an anterior segmental branch of the medial segment of the left lobe of the liver correlating with prior biopsy site.  Diffuse vessel spasm was noted about this distal vessel and as such, sub selective arteriogram and coil embolization could not be performed. Rather the anterior segmental vascular distribution was particle embolized with Embospheres.  Completion arteriogram demonstrates complete stasis of the anterior segmental branch of the accessory left hepatic artery with no additional areas of active extravasation.  Following ultrasound fluoroscopic guided placement, the temporary dialysis catheter tip terminates within the caudal aspect of the IVC.  IMPRESSION: 1. Technically successful particle embolization of the anterior division of the accessory left hepatic artery for active hepatic arterial extravasation. 2. Successful ultrasound fluoroscopic guided placement of a left common femoral vein approach temporary dialysis catheter with tip terminating within the caudal aspect of the IVC. The temporary dialysis catheter is ready for immediate use.   Electronically  Signed   By: Sandi Mariscal M.D.   On: 04/18/2015 10:32    Medications: . antiseptic oral rinse  7 mL Mouth Rinse QID  . arformoterol  15 mcg Nebulization BID  . budesonide (PULMICORT) nebulizer solution  0.25 mg Nebulization BID  . calcium gluconate IVPB  4 g Intravenous Once  . chlorhexidine  15 mL Mouth Rinse BID  . fentaNYL (SUBLIMAZE) injection  100 mcg Intravenous Once  . folic acid  1 mg Intravenous Daily  . heparin  1,000 Units Intracatheter Once  . imipenem-cilastatin  250 mg Intravenous 4 times per day  . lactulose  30 g Per Tube TID  . pantoprazole (PROTONIX) IV  40 mg Intravenous Q24H  . Vitamin D (Ergocalciferol)  50,000 Units Oral Q Sun   . citrate dextrose    . dextrose 50 mL/hr at 04/19/15 0700  . fentaNYL infusion INTRAVENOUS 200 mcg/hr (04/19/15 0700)  . norepinephrine (LEVOPHED) Adult infusion 20 mcg/min (04/19/15 1145)  . dialysis replacement fluid (prismasate) 400 mL/hr at 04/19/15 0944  . dialysis replacement fluid (prismasate) 400 mL/hr at 04/19/15 0811  . dialysate (PRISMASATE) 1,800 mL/hr at 04/19/15 0812    Assessment/Plan 1. S/p liver biopsy with hemoperitoneum S/p particle embolization of the medial segmental branch of the left hepatic artery for active extravasation & temporary HD catheter placement 04/18/15 in IR Dr. Pascal Lux 2. Hemorrhagic shock  - pressor support 3.Ventilator dependant respiratory failure 4. Acute kidney injury  - on dialysis 5. Hypercoagulable with elevated INR/PTT -  Ongoing bleeding 6. 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    Recent Labs Lab 04/15/15 [phone]  04/17/15 0353 04/18/15 0222 04/18/15 2020 04/19/15 0456  AST 406* 347* 948* 6302* 9238*  ALT 112* 101* 183* 981* 1331*  ALKPHOS 520* 506* 345* 291* 354*  BILITOT 33.0* 27.9* 25.5* 33.7* 35.4*  PROT 5.7* 5.6* 4.6* 4.9* 5.0*  ALBUMIN 1.6* 1.5* 1.2* 2.0* 1.9*  1.9*    Plan:  CCM is working  to correct bleeding. H/H up this AM.  No surgical role at this point.  LOS: 10 days    Coley Littles 04/19/2015

## 2015-04-19 NOTE — Progress Notes (Signed)
eLink Physician-Brief Progress Note Patient Name: Joan Martin DOB: 11/27/1985 MRN: [id number]   Date of Service  04/19/2015  HPI/Events of Note  Multiple issues: 1. Increased vasopressor requirement and 2. Additional AM labs? CVP = 5. Last Lactic Acid = 10.4.  eICU Interventions  Will order: 1. 0.9 NaCl 1 liter IV now.  2. Add CBC and Fibrinogen to AM labs     Intervention Category Major Interventions: Hypovolemia - evaluation and treatment with fluids;Hypotension - evaluation and management Intermediate Interventions: Other:  Sommer,Steven Dennard N[place] 04/19/2015, 4:47 AM

## 2015-04-19 NOTE — Progress Notes (Signed)
Joan Martin   DOB:[birth year]   ZO#:[id number]   JWJ#:[id number]  Subjective: Pt remains to be on MV, and CRRT. Hypotensive, on pressor. She has slow bleeding from multiple IV lines and mouth. She received 2u RBC, 2U FFP, and plts yesterday, 2u FFP this morning.    Objective:  Filed Vitals:   04/19/15 2200  BP:   Pulse:   Temp: 98.2 F (36.8 C)  Resp: 28    Body mass index is 26.96 kg/(m^2).  Intake/Output Summary (Last 24 hours) at 04/19/15 2227 Last data filed at 04/19/15 2200  Gross per 24 hour  Intake 4894.59 ml  Output   1608 ml  Net 3286.59 ml    Pt on mechanical ventilator, sedated  Sclerae (+) jaundice   No peripheral adenopathy  Lungs clear -- no rales or rhonchi  Heart regular rate and rhythm  Abdomen (+) very distended and firm   MSK no focal spinal tenderness, no peripheral edema  (+) bleeding at multiple iV line insertion sites    CBG (last 3)   Recent Labs  04/19/15 0738 04/19/15 1134 04/19/15 1558  GLUCAP 77 94 79     Labs:  Lab Results  Component Value Date   WBC 25.2* 04/19/2015   HGB 8.1* 04/19/2015   HCT 22.9* 04/19/2015   MCV 83.6 04/19/2015   PLT 176 04/19/2015   NEUTROABS 16.1* 04/18/2015    @  Urine Studies No results for input(s): UHGB, CRYS in the last 72 hours.  Invalid input(s): UACOL, UAPR, USPG, UPH, UTP, UGL, [place], UBIL, UNIT, UROB, [place], UEPI, UWBC, [place], [place], [place], [employer], [place]  Basic Metabolic Panel:  Recent Labs Lab 04/17/15 2037 04/18/15 0222 04/18/15 1110 04/18/15 2020 04/19/15 0456  NA 138 138 141 141 139  K 6.4* 7.2* 4.7 4.3 4.0  CL 117* 114* 108 107 106  CO2 9* 9* 15* 14* 16*  GLUCOSE 20* 96 102* 74 94  BUN 19 21* CREATININE 0.62 1.09* 0.61 0.61 0.39*  CALCIUM 9.2 7.5* 7.1* 6.4* 6.1*  MG  --  2.2  --   --  2.1  PHOS  --  10.0*  --   --  3.6  3.5   GFR Estimated Creatinine Clearance: 110.7 mL/min (by C-G formula based on Cr of 0.39). Liver Function Tests:  Recent Labs Lab  04/15/15 0611 04/17/15 0353 04/18/15 0222 04/18/15 2020 04/19/15 0456  AST 406* 347* 948* 6302* 9238*  ALT 112* 101* 183* 981* 1331*  ALKPHOS 520* 506* 345* 291* 354*  BILITOT 33.0* 27.9* 25.5* 33.7* 35.4*  PROT 5.7* 5.6* 4.6* 4.9* 5.0*  ALBUMIN 1.6* 1.5* 1.2* 2.0* 1.9*  1.9*    Recent Labs Lab 04/18/15 2020  LIPASE >3000*    Recent Labs Lab 04/17/15 2347  AMMONIA 141*   Coagulation profile  Recent Labs Lab 04/17/15 2244 04/18/15 1110 04/18/15 2020 04/19/15 0456 04/19/15 2150  INR 2.09* 1.90* 1.86*  1.88* 2.01* 2.13*    CBC:  Recent Labs Lab 04/13/15 0440 04/15/15 6213 04/17/15 0353 04/17/15 2037 04/18/15 0222 04/18/15 1110 04/18/15 2020 04/19/15 0500  WBC 18.6* 23.8* 24.1* 30.5* 32.3*  --  22.5* 25.2*  NEUTROABS 10.7* 8.8* 12.3*  --  22.7*  --  16.1*  --   HGB 7.6* 7.1* 6.9* 4.7* 5.5* 5.7* 7.6* 8.1*  HCT 22.2* 20.6* 19.7* 13.8* 16.7* 17.0* 22.4* 22.9*  MCV 96.1 96.3 96.6 97.9 96.0  --  85.2 83.6  PLT 170 165 152 89* 112*  --  185  167 176   Cardiac Enzymes:  Recent Labs Lab 04/18/15 2020 04/19/15 0456  CKTOTAL 684* 1726*  CKMB 24.1*  --   TROPONINI 0.15*  --    BNP: Invalid input(s): POCBNP CBG:  Recent Labs Lab 04/18/15 2346 04/19/15 0345 04/19/15 0738 04/19/15 1134 04/19/15 1558  GLUCAP 116* 82 77 94 79   D-Dimer  Recent Labs  04/18/15 2020  DDIMER >20.00*   Hgb A1c No results for input(s): HGBA1C in the last 72 hours. Lipid Profile No results for input(s): CHOL, HDL, LDLCALC, TRIG, CHOLHDL, LDLDIRECT in the last 72 hours. Thyroid function studies No results for input(s): TSH, T4TOTAL, T3FREE, THYROIDAB in the last 72 hours.  Invalid input(s): FREET3 Anemia work up  [employer]  04/17/15 2236 04/18/15 0222  RETICCTPCT 19.5* 14.2*   Microbiology Recent Results (from the past 240 hour(s))  MRSA PCR Screening     Status: None   Collection Time: 04/17/15  8:36 PM  Result Value Ref Range Status   MRSA by PCR  NEGATIVE NEGATIVE Final    Comment:        The GeneXpert MRSA Assay (FDA approved for NASAL specimens only), is one component of a comprehensive MRSA colonization surveillance program. It is not intended to diagnose MRSA infection nor to guide or monitor treatment for MRSA infections.   Culture, blood (routine x 2)     Status: None (Preliminary result)   Collection Time: 04/17/15 10:37 PM  Result Value Ref Range Status   Specimen Description BLOOD BLOOD LEFT FOREARM  Final   Special Requests BOTTLES DRAWN AEROBIC ONLY 6CC  Final   Culture   Final    NO GROWTH 1 DAY Performed at [facility]    Report Status PENDING  Incomplete  Culture, respiratory (NON-Expectorated)     Status: None (Preliminary result)   Collection Time: 04/18/15 12:02 AM  Result Value Ref Range Status   Specimen Description TRACHEAL ASPIRATE  Final   Special Requests Normal  Final   Gram Stain   Final    NO WBC SEEN RARE SQUAMOUS EPITHELIAL CELLS PRESENT RARE GRAM POSITIVE RODS Performed at [facility]    Culture   Final    Non-Pathogenic Oropharyngeal-type [place] Isolated. Performed at [facility]    Report Status PENDING  Incomplete  Urine culture     Status: None (Preliminary result)   Collection Time: 04/18/15 11:15 AM  Result Value Ref Range Status   Specimen Description URINE, CATHETERIZED  Final   Special Requests Normal  Final   Culture   Final    NO GROWTH < 24 HOURS Performed at [facility] [place] [facility]    Report Status PENDING  Incomplete  Culture, blood (routine x 2)     Status: None (Preliminary result)   Collection Time: 04/18/15 11:50 PM  Result Value Ref Range Status   Specimen Description BLOOD BLOOD LEFT HAND  Final   Special Requests BOTTLES DRAWN AEROBIC ONLY  Final   Culture   Final    NO GROWTH 1 DAY Performed at [facility]    Report Status PENDING  Incomplete      Studies:  Reviewed   ASSESSMENT & PLAN:  29 year old  African-American female, who is known history of sickle cell SS disease, presented with worsening anemia and pain crisis.  She was found to have liver cirrhosis on CT, and underwent liver biopsy on 6/28. She developed hemorrhagic shock after biopsy, acute renal failure with metabolic acidosis and hypercalcemia,  and got intubated.   1. Hemorrhagic shock after liver biopsy, status post left hepatic artery embolization 6/28, hypotension resolved 2. Severe anemia from bleeding and baseline sickle cell disease and hemolysis, no evidence of autoimmune hemostasis 3. Liver cirrhosis, and fulminant liver failure  4. 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 AKI  6. Hb SS with crisis  7. 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 Thrombocytopenia, likely secondary to acute bleeding  Recommendations: -Unfortunately her coagulapathy has been difficult to correct, due to her fulminant liver failure and DIC. Continue monitor PT/INR and fibrinogen closely, continue FFP transfusion and cryo if fibrinogen <100 to improve her coagulopathy  -Her abdominal bleeding is not mainly due to her coagulopathy, so the benefit of using  hemostasis agent such as novo seven is limited  -continue supportive care, blood transfusion if Hb<7. Given her multiple prior blood transfusion, potential allo-antibody from blood transfusion, and history of possible transfusion-related hemoptysis, need to inform the blood bank for careful antibody workup before blood transfusion.  -other management per ICU, GI and renal team  I'll follow up.   Malachy MoodFeng, Sheyenne Konz, MD 04/19/2015  10:27 PM

## 2015-04-19 NOTE — Progress Notes (Signed)
Patient ID: Joan Martin, female   DOB: [birth year], 29 y.o.   MRN: [id number]    Referring Physician(s): Matthews,Michelle A  Subjective:  Pt on vent; opens eyes and F/C; still with intermittent oozing of blood from lines, mouth, nose; on pressors, getting CRRT  Allergies: Cephalosporins; Citrus; and Morphine and related  Medications: Prior to Admission medications   Medication Sig Start Date End Date Taking? Authorizing Provider  albuterol (PROVENTIL) (2.5 MG/3ML) 0.083% nebulizer solution Take 3 mLs (2.5 mg total) by nebulization every 6 (six) hours as needed for shortness of breath. 12/01/13  Yes Dorena Dew, FNP  diphenhydrAMINE (BENADRYL) 25 MG tablet Take 25 mg by mouth every 6 (six) hours as needed for itching.   Yes Historical Provider, MD  Fluticasone-Salmeterol (ADVAIR) 250-50 MCG/DOSE AEPB Inhale 1 puff into the lungs daily.   Yes Historical Provider, MD  folic acid (FOLVITE) 1 MG tablet Take 1 tablet (1 mg total) by mouth daily. 01/03/14  Yes Dorena Dew, FNP  hydroxyurea (HYDREA) 500 MG capsule TAKE ONE CAPSULE BY MOUTH TWICE DAILY   Yes Leana Gamer, MD  ibuprofen (ADVIL,MOTRIN) 600 MG tablet Take 1 tablet (600 mg total) by mouth every 8 (eight) hours as needed for pain. 07/27/13  Yes Kerin Perna, NP  mometasone (NASONEX) 50 MCG/ACT nasal spray Place 2 sprays into the nose daily. Patient taking differently: Place 2 sprays into the nose at bedtime.  11/23/14  Yes Dorena Dew, FNP  ondansetron (ZOFRAN) 4 MG tablet Take 1 tablet (4 mg total) by mouth every 6 (six) hours. Patient taking differently: Take 4 mg by mouth every 6 (six) hours as needed for nausea or vomiting.  10/19/14  Yes Dorena Dew, FNP  oxyCODONE-acetaminophen (PERCOCET) 10-325 MG per tablet Take 1 tablet every 4 hours for severe pain Patient taking differently: Take 1 tablet by mouth every 4 (four) hours as needed for pain.  02/13/15  Yes Dorena Dew, FNP  promethazine  (PHENERGAN) 25 MG tablet Take 25 mg by mouth every 6 (six) hours as needed for nausea.   Yes Historical Provider, MD  topiramate (TOPAMAX) 50 MG tablet Take 0.5tab BID x7d, then 1tab BID. Patient taking differently: Take 50 mg by mouth 2 (two) times daily.  01/17/15  Yes Pieter Partridge, DO  SUMAtriptan (IMITREX) 25 MG tablet Take 1 tablet (25 mg total) by mouth once. May repeat in 2 hours if headache persists or recurs. Not to exceed 2 doses in 24 hours 10/19/14   Dorena Dew, FNP  Vitamin D, Ergocalciferol, (DRISDOL) 50000 UNITS CAPS capsule TAKE 1 CAPSULE BY MOUTH ONCE WEEKLY 04/10/15   Dorena Dew, FNP     Vital Signs: BP 109/55 mmHg  Pulse 89  Temp(Src) 95.5 F (35.3 C) (Core (Comment))  Resp 28  Ht 5' 7"  (1.702 m)  Wt 172 lb 2.9 oz (78.1 kg)  BMI 26.96 kg/m2  SpO2 97%  PF   LMP 04/03/2015  Physical Exam on vent; F/C; rt CFA sheath intact, some oozing from site, dressing changed this am; abd dist, no BS, mild tenderness, ext- 2+ edema  Imaging: Ct Head Wo Contrast  04/18/2015   CLINICAL DATA:  Evaluate for acute encephalopathy. Hemorrhage after liver biopsy. History of sickle cell anemia, stroke.  EXAM: CT HEAD WITHOUT CONTRAST  TECHNIQUE: Contiguous axial images were obtained from the base of the skull through the vertex without intravenous contrast.  COMPARISON:  MRI of the brain January 22, 2015  FINDINGS:  The ventricles and sulci are normal. No intraparenchymal hemorrhage, mass effect nor midline shift. No acute large vascular territory infarcts.  No abnormal extra-axial fluid collections. Basal cisterns are patent.  No skull fracture. The included ocular globes and orbital contents are non-suspicious. The mastoid aircells and included paranasal sinuses are well-aerated.  IMPRESSION: No acute intracranial process; normal noncontrast CT head for age.   Electronically Signed   By: Elon Alas M.D.   On: 04/18/2015 06:17   Ct Abdomen Pelvis W Contrast  04/18/2015   CLINICAL  DATA:  Dropping hemoglobin after liver biopsy 04/17/2015.  EXAM: CT ABDOMEN AND PELVIS WITH CONTRAST  TECHNIQUE: Multidetector CT imaging of the abdomen and pelvis was performed using the standard protocol following bolus administration of intravenous contrast.  CONTRAST:  1106m OMNIPAQUE IOHEXOL 300 MG/ML  SOLN  COMPARISON:  None.  FINDINGS: There is a large volume of peritoneal fluid. This probably is blood based on density of the fluid. There is active arterial hemorrhage during the scan, at the anterior surface of the left hepatic lobe. There are a few bubbles of peritoneal air, presumably from the percutaneous liver biopsy.  The liver is enlarged and cirrhotic. No intraparenchymal or subcapsular hemorrhage is evident. No focal liver lesion is evident. There is prior cholecystectomy.  There are unremarkable appearances of the pancreas and adrenals.  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 The spleen is either absent or very diminutive, consistent with the known sickle cell disease.  The abdominal aorta is normal in caliber. The inferior vena cava is collapsed, consistent with hypovolemia.  Bowel is unremarkable.  There is a Foley catheter and a rectal catheter.  Minimal atelectatic appearing posterior lung base opacities are present bilaterally, as well as trace pleural effusions.  IMPRESSION: Large volume hemoperitoneum with active arterial hemorrhage from the anterior surface of the left hepatic lobe. Recommend IR consultation to consider embolization. These results were called by telephone at the time of interpretation on 04/18/2015 at 1:54 am to charge nurse HJennye Moccasin who verbally acknowledged these results. I provided him with the name and pager number of the interventionalist on call.  Enlarged, cirrhotic liver.  Bilateral renal infarctions   Electronically Signed   By: DAndreas NewportM.D.   On: 04/18/2015 01:57   Ir Angiogram Visceral Selective  04/18/2015   CLINICAL DATA:  29year old female with  a history of recent percutaneous liver biopsy for concern of cirrhosis diagnosis.  The patient subsequently developed intra abdominal hemorrhage and underwent embolization of left hepatic artery branches on the same day. Given that the patient is coagulopathic and is hypothermic, it seems as though she has continued to bleed through the particle embolization, with low hemoglobin and downward trend of her blood pressure.  She presents for mesenteric angiogram and potential further embolization.  EXAM: MESENTERIC ANGIOGRAM, WITH ANGIOGRAM OF THE CELIAC ARTERY, LEFT GASTRIC ARTERY, LEFT HEPATIC ARTERY, AND SEGMENTAL BRANCHES OF THE LEFT HEPATIC ARTERY.  COIL EMBOLIZATION OF INFERIOR SEGMENT LEFT HEPATIC ARTERY.  ANESTHESIA/SEDATION: The patient is intubated, with management from respiratory therapy team.  2.0 Mg IV Versed; fentanyl infusion  Contrast Volume: 120 cc Omni 300  Additional Medications: 5 under mcg nitroglycerin, 2.5 mg intra-arterial verapamil  FLUOROSCOPY TIME:  29 minutes, 12 seconds Minutes.  PROCEDURE: The procedure, risks, benefits, and alternatives were explained to the patient's family. Questions regarding the procedure were encouraged and answered. The patient understands and consents to the procedure.  The indwelling right common femoral artery sheath was prepped and draped in  the usual sterile fashion, as well as the right groin, which was prepped with Betadine in a sterile fashion, and a sterile drape was applied covering the operative field.  Wire was advanced through the indwelling sheath, which was then exchanged over the wire for a 35 cm 5 French sheath. This was attached to a pressure bag with heparinized saline.  A 5 [place] Mickelson catheter was then used to select the celiac artery and a celiac artery angiogram was performed with power injection.  The Mickelson catheter and a Glidewire were used to select the left gastric artery, with the [facility] the catheter advanced over the  Glidewire into the proximal left gastric artery.  Left gastric artery angiogram was performed.  100 mcg of nitroglycerin were then infused intra arterial.  A renegade STC catheter was then advanced over a 014 fathom wire through the left gastric artery to select the the replaced left hepatic artery, with sub selection of the inferior segments of the left hepatic artery, which were visualized to contribute to ongoing contrast pooling.  Sub selection of distal branches was performed with coil embolization with 2 mm and 3 mm diameter interlock coils.  Repeat angiogram of the segments demonstrated on going filling of the target vasculature, and further coil embolization of inferior segment of the left hepatic artery was required.  Gel-Foam slurry was infused after coil embolization of the downgoing/inferior segment.  Repeat angiogram demonstrated no further filling of the target vasculature.  No complications were encountered.  No significant blood loss was encountered during the procedure.  Patient remained hemodynamically stable through the procedure.  COMPLICATIONS: None.  FINDINGS: Angiogram of the celiac artery demonstrates variant anatomy of the hepatic vasculature, seen on prior study. Replaced left hepatic artery from left gastric artery again visualized, which was selected.  Angiogram demonstrates vessel spasm with attenuation of multiple distal branches, compatible with the patient's current hypovolemic shock.  Selected angiography of segments of the left hepatic artery, segment 4 branch, demonstrate ongoing contrast pooling in the similar location to the comparison study.  After coil embolization of the targeted vasculature, there is no further filling on contrast injection.  IMPRESSION: Status post mesenteric angiogram with sub selection of replaced left hepatic artery via the left gastric artery, with coil embolization of segment 4 branches of the left hepatic artery, which occluded flow to the target  vasculature. No further flow to the targeted vessels identified on the completion angiogram.  Signed,  Dulcy Fanny. Earleen Newport, DO  [facility]  [facility]   Electronically Signed   By: Corrie Mckusick D.O.   On: 04/18/2015 17:22   Ir Angiogram Visceral Selective  04/18/2015   INDICATION: History of cirrhosis of uncertain etiology post ultrasound-guided liver biopsy on 04/17/2015, now with hemodynamic instability and postprocedural CT scan demonstrated active extravasation from the left lobe of the liver. Please perform mesenteric arteriogram and possible embolization.  Patient also with acute renal insufficiency. Please perform ultrasound fluoroscopic guided temporary dialysis catheter placement.  EXAM: 1. ULTRASOUND GUIDANCE FOR ARTERIAL ACCESS 2. CELIAC ARTERIOGRAM 3. SELECTIVE LEFT GASTRIC ARTERIOGRAM 4. SELECTIVE ACCESSORY LEFT HEPATIC ARTERIOGRAM 5. SELECTIVE ARTERIOGRAM OF THE ANTERIOR DIVISION OF THE ACCESSORY LEFT HEPATIC ARTERY AND PERCUTANEOUS PARTICLE EMBOLIZATION 6. ULTRASOUND AND FLUOROSCOPIC GUIDED LEFT COMMON FEMORAL VEIN APPROACH TEMPORARY DIALYSIS CATHETER.  COMPARISON:  CT the abdomen pelvis - 04/18/2015; ultrasound-guided random liver biopsy - 04/17/2015  MEDICATIONS: Patient intubated and sedated with continuous monitoring via the ICU staff.  CONTRAST:  31m OMNIPAQUE IOHEXOL  300 MG/ML SOLN, 55m OMNIPAQUE IOHEXOL 300 MG/ML SOLN  FLUOROSCOPY TIME:  1 minutes 42 seconds (11,093mGy)  COMPLICATIONS: None immediate  TECHNIQUE: Informed written consent was obtained from the patient's father after a discussion of the risks, benefits and alternatives to treatment. Questions regarding the procedure were encouraged and answered. A timeout was performed prior to the initiation of the procedure.  The bilateral groins were prepped and draped in the usual sterile fashion, and a sterile drape was applied covering the operative field. Maximum barrier sterile technique  with sterile gowns and gloves were used for the procedure. A timeout was performed prior to the initiation of the procedure. Local anesthesia was provided with 1% lidocaine.  The right femoral head was marked fluoroscopically. Under ultrasound guidance, the right common femoral artery was accessed with a micropuncture kit after the overlying soft tissues were anesthetized with 1% lidocaine. An ultrasound image was saved for documentation purposes. The micropuncture sheath was exchanged for a 5 [place] sheath over a Bentson wire. A closure arteriogram was performed through the side of the sheath confirming access within the right common femoral artery.  Over a Bentson wire, a Mickelson catheter was advanced to the level of the thoracic aorta where it was back bled and flushed. The catheter was then utilized to select the celiac artery and a celiac arteriogram was performed.  The Mickelson catheter was retracted to select the left gastric artery and a left gastric arteriogram was performed.  With the use of a Fathom micro wire, a high-flow Renegade micro catheter was advanced into the accessory left hepatic artery and a selective accessory left hepatic arteriogram was performed.  Over an exchange length microwire a regular Renegade micro catheter was advanced into the anterior segmental branch of the accessory left hepatic artery and a selective arteriogram was performed.  Despite prolonged efforts, the micro catheter could not be advanced into the tiny branch vessel of the left hepatic artery supplying the area of persistent active extravasation. As such, particle embolization was performed of the anterior dictation of the left lateral hepatic artery with 700 to 900 micron Embospheres.  The micro catheter was retracted into the accessory left hepatic artery and a completion accessory left hepatic arteriogram was performed. All wires and catheters were removed from the patient. The right common femoral artery  approach vascular sheath was secured at the right groin access site within interrupted suture and connected to a pressure bag.  Attention was now paid towards placement of the temporary dialysis catheter.  The left femoral head was marked fluoroscopically. Under direct ultrasound guidance, the left common femoral vein was accessed with a micropuncture kit. The track was dilated under intermittent fluoroscopic guidance ultimately allowing placement of a 20 cm non tunneled dialysis dialysis catheter with tip terminating within the distal aspect of the IVC. All lumens were noted to easily aspirate and flush peer and all lumens were flushed with appropriate heparin wells. Concern for dialysis catheter was secured in place within interrupted suture.  Dressings were applied. The patient tolerated the above procedure well without immediate postprocedural complication and remained hemodynamically stable throughout the procedure.  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  Selective left gastric and accessory left hepatic arteriograms demonstrates an ill-defined area of active extravasation arising from an anterior segmental branch of the medial segment  of the left lobe of the liver correlating with prior biopsy site.  Diffuse vessel spasm was noted about this distal vessel and as such, sub selective arteriogram and coil embolization could not be performed. Rather the anterior segmental vascular distribution was particle embolized with Embospheres.  Completion arteriogram demonstrates complete stasis of the anterior segmental branch of the accessory left hepatic artery with no additional areas of active extravasation.  Following ultrasound fluoroscopic guided placement, the temporary dialysis catheter tip terminates within the caudal  aspect of the IVC.  IMPRESSION: 1. Technically successful particle embolization of the anterior division of the accessory left hepatic artery for active hepatic arterial extravasation. 2. Successful ultrasound fluoroscopic guided placement of a left common femoral vein approach temporary dialysis catheter with tip terminating within the caudal aspect of the IVC. The temporary dialysis catheter is ready for immediate use.   Electronically Signed   By: Sandi Mariscal M.D.   On: 04/18/2015 10:32   Ir Angiogram Selective Each Additional Vessel  04/18/2015   CLINICAL DATA:  29 year old female with a history of recent percutaneous liver biopsy for concern of cirrhosis diagnosis.  The patient subsequently developed intra abdominal hemorrhage and underwent embolization of left hepatic artery branches on the same day. Given that the patient is coagulopathic and is hypothermic, it seems as though she has continued to bleed through the particle embolization, with low hemoglobin and downward trend of her blood pressure.  She presents for mesenteric angiogram and potential further embolization.  EXAM: MESENTERIC ANGIOGRAM, WITH ANGIOGRAM OF THE CELIAC ARTERY, LEFT GASTRIC ARTERY, LEFT HEPATIC ARTERY, AND SEGMENTAL BRANCHES OF THE LEFT HEPATIC ARTERY.  COIL EMBOLIZATION OF INFERIOR SEGMENT LEFT HEPATIC ARTERY.  ANESTHESIA/SEDATION: The patient is intubated, with management from respiratory therapy team.  2.0 Mg IV Versed; fentanyl infusion  Contrast Volume: 120 cc Omni 300  Additional Medications: 5 under mcg nitroglycerin, 2.5 mg intra-arterial verapamil  FLUOROSCOPY TIME:  29 minutes, 12 seconds Minutes.  PROCEDURE: The procedure, risks, benefits, and alternatives were explained to the patient's family. Questions regarding the procedure were encouraged and answered. The patient understands and consents to the procedure.  The indwelling right common femoral artery sheath was prepped and draped in the usual sterile fashion, as  well as the right groin, which was prepped with Betadine in a sterile fashion, and a sterile drape was applied covering the operative field.  Wire was advanced through the indwelling sheath, which was then exchanged over the wire for a 35 cm 5 French sheath. This was attached to a pressure bag with heparinized saline.  A 5 [place] Mickelson catheter was then used to select the celiac artery and a celiac artery angiogram was performed with power injection.  The Mickelson catheter and a Glidewire were used to select the left gastric artery, with the [facility] the catheter advanced over the Glidewire into the proximal left gastric artery.  Left gastric artery angiogram was performed.  100 mcg of nitroglycerin were then infused intra arterial.  A renegade STC catheter was then advanced over a 014 fathom wire through the left gastric artery to select the the replaced left hepatic artery, with sub selection of the inferior segments of the left hepatic artery, which were visualized to contribute to ongoing contrast pooling.  Sub selection of distal branches was performed with coil embolization with 2 mm and 3 mm diameter interlock coils.  Repeat angiogram of the segments demonstrated on going filling of the target vasculature, and further coil embolization of inferior segment of the  left hepatic artery was required.  Gel-Foam slurry was infused after coil embolization of the downgoing/inferior segment.  Repeat angiogram demonstrated no further filling of the target vasculature.  No complications were encountered.  No significant blood loss was encountered during the procedure.  Patient remained hemodynamically stable through the procedure.  COMPLICATIONS: None.  FINDINGS: Angiogram of the celiac artery demonstrates variant anatomy of the hepatic vasculature, seen on prior study. Replaced left hepatic artery from left gastric artery again visualized, which was selected.  Angiogram demonstrates vessel spasm with attenuation  of multiple distal branches, compatible with the patient's current hypovolemic shock.  Selected angiography of segments of the left hepatic artery, segment 4 branch, demonstrate ongoing contrast pooling in the similar location to the comparison study.  After coil embolization of the targeted vasculature, there is no further filling on contrast injection.  IMPRESSION: Status post mesenteric angiogram with sub selection of replaced left hepatic artery via the left gastric artery, with coil embolization of segment 4 branches of the left hepatic artery, which occluded flow to the target vasculature. No further flow to the targeted vessels identified on the completion angiogram.  Signed,  Dulcy Fanny. Earleen Newport, DO  [facility]  [facility]   Electronically Signed   By: Corrie Mckusick D.O.   On: 04/18/2015 17:22   Ir Angiogram Selective Each Additional Vessel  04/18/2015   CLINICAL DATA:  29 year old female with a history of recent percutaneous liver biopsy for concern of cirrhosis diagnosis.  The patient subsequently developed intra abdominal hemorrhage and underwent embolization of left hepatic artery branches on the same day. Given that the patient is coagulopathic and is hypothermic, it seems as though she has continued to bleed through the particle embolization, with low hemoglobin and downward trend of her blood pressure.  She presents for mesenteric angiogram and potential further embolization.  EXAM: MESENTERIC ANGIOGRAM, WITH ANGIOGRAM OF THE CELIAC ARTERY, LEFT GASTRIC ARTERY, LEFT HEPATIC ARTERY, AND SEGMENTAL BRANCHES OF THE LEFT HEPATIC ARTERY.  COIL EMBOLIZATION OF INFERIOR SEGMENT LEFT HEPATIC ARTERY.  ANESTHESIA/SEDATION: The patient is intubated, with management from respiratory therapy team.  2.0 Mg IV Versed; fentanyl infusion  Contrast Volume: 120 cc Omni 300  Additional Medications: 5 under mcg nitroglycerin, 2.5 mg intra-arterial verapamil  FLUOROSCOPY TIME:   29 minutes, 12 seconds Minutes.  PROCEDURE: The procedure, risks, benefits, and alternatives were explained to the patient's family. Questions regarding the procedure were encouraged and answered. The patient understands and consents to the procedure.  The indwelling right common femoral artery sheath was prepped and draped in the usual sterile fashion, as well as the right groin, which was prepped with Betadine in a sterile fashion, and a sterile drape was applied covering the operative field.  Wire was advanced through the indwelling sheath, which was then exchanged over the wire for a 35 cm 5 French sheath. This was attached to a pressure bag with heparinized saline.  A 5 [place] Mickelson catheter was then used to select the celiac artery and a celiac artery angiogram was performed with power injection.  The Mickelson catheter and a Glidewire were used to select the left gastric artery, with the [facility] the catheter advanced over the Glidewire into the proximal left gastric artery.  Left gastric artery angiogram was performed.  100 mcg of nitroglycerin were then infused intra arterial.  A renegade STC catheter was then advanced over a 014 fathom wire through the left gastric artery to select the the replaced left hepatic artery,  with sub selection of the inferior segments of the left hepatic artery, which were visualized to contribute to ongoing contrast pooling.  Sub selection of distal branches was performed with coil embolization with 2 mm and 3 mm diameter interlock coils.  Repeat angiogram of the segments demonstrated on going filling of the target vasculature, and further coil embolization of inferior segment of the left hepatic artery was required.  Gel-Foam slurry was infused after coil embolization of the downgoing/inferior segment.  Repeat angiogram demonstrated no further filling of the target vasculature.  No complications were encountered.  No significant blood loss was encountered during the  procedure.  Patient remained hemodynamically stable through the procedure.  COMPLICATIONS: None.  FINDINGS: Angiogram of the celiac artery demonstrates variant anatomy of the hepatic vasculature, seen on prior study. Replaced left hepatic artery from left gastric artery again visualized, which was selected.  Angiogram demonstrates vessel spasm with attenuation of multiple distal branches, compatible with the patient's current hypovolemic shock.  Selected angiography of segments of the left hepatic artery, segment 4 branch, demonstrate ongoing contrast pooling in the similar location to the comparison study.  After coil embolization of the targeted vasculature, there is no further filling on contrast injection.  IMPRESSION: Status post mesenteric angiogram with sub selection of replaced left hepatic artery via the left gastric artery, with coil embolization of segment 4 branches of the left hepatic artery, which occluded flow to the target vasculature. No further flow to the targeted vessels identified on the completion angiogram.  Signed,  Dulcy Fanny. Earleen Newport, DO  [facility]  [facility]   Electronically Signed   By: Corrie Mckusick D.O.   On: 04/18/2015 17:22   Ir Angiogram Selective Each Additional Vessel  04/18/2015   INDICATION: History of cirrhosis of uncertain etiology post ultrasound-guided liver biopsy on 04/17/2015, now with hemodynamic instability and postprocedural CT scan demonstrated active extravasation from the left lobe of the liver. Please perform mesenteric arteriogram and possible embolization.  Patient also with acute renal insufficiency. Please perform ultrasound fluoroscopic guided temporary dialysis catheter placement.  EXAM: 1. ULTRASOUND GUIDANCE FOR ARTERIAL ACCESS 2. CELIAC ARTERIOGRAM 3. SELECTIVE LEFT GASTRIC ARTERIOGRAM 4. SELECTIVE ACCESSORY LEFT HEPATIC ARTERIOGRAM 5. SELECTIVE ARTERIOGRAM OF THE ANTERIOR DIVISION OF THE ACCESSORY LEFT  HEPATIC ARTERY AND PERCUTANEOUS PARTICLE EMBOLIZATION 6. ULTRASOUND AND FLUOROSCOPIC GUIDED LEFT COMMON FEMORAL VEIN APPROACH TEMPORARY DIALYSIS CATHETER.  COMPARISON:  CT the abdomen pelvis - 04/18/2015; ultrasound-guided random liver biopsy - 04/17/2015  MEDICATIONS: Patient intubated and sedated with continuous monitoring via the ICU staff.  CONTRAST:  24m OMNIPAQUE IOHEXOL 300 MG/ML SOLN, 536mOMNIPAQUE IOHEXOL 300 MG/ML SOLN  FLUOROSCOPY TIME:  1 minutes 42 seconds (1,1,638Gy)  COMPLICATIONS: None immediate  TECHNIQUE: Informed written consent was obtained from the patient's father after a discussion of the risks, benefits and alternatives to treatment. Questions regarding the procedure were encouraged and answered. A timeout was performed prior to the initiation of the procedure.  The bilateral groins were prepped and draped in the usual sterile fashion, and a sterile drape was applied covering the operative field. Maximum barrier sterile technique with sterile gowns and gloves were used for the procedure. A timeout was performed prior to the initiation of the procedure. Local anesthesia was provided with 1% lidocaine.  The right femoral head was marked fluoroscopically. Under ultrasound guidance, the right common femoral artery was accessed with a micropuncture kit after the overlying soft tissues were anesthetized with 1% lidocaine. An ultrasound image was saved  for documentation purposes. The micropuncture sheath was exchanged for a 5 [place] vascular sheath over a Bentson wire. A closure arteriogram was performed through the side of the sheath confirming access within the right common femoral artery.  Over a Bentson wire, a Mickelson catheter was advanced to the level of the thoracic aorta where it was back bled and flushed. The catheter was then utilized to select the celiac artery and a celiac arteriogram was performed.  The Mickelson catheter was retracted to select the left gastric artery and a left  gastric arteriogram was performed.  With the use of a Fathom micro wire, a high-flow Renegade micro catheter was advanced into the accessory left hepatic artery and a selective accessory left hepatic arteriogram was performed.  Over an exchange length microwire a regular Renegade micro catheter was advanced into the anterior segmental branch of the accessory left hepatic artery and a selective arteriogram was performed.  Despite prolonged efforts, the micro catheter could not be advanced into the tiny branch vessel of the left hepatic artery supplying the area of persistent active extravasation. As such, particle embolization was performed of the anterior dictation of the left lateral hepatic artery with 700 to 900 micron Embospheres.  The micro catheter was retracted into the accessory left hepatic artery and a completion accessory left hepatic arteriogram was performed. All wires and catheters were removed from the patient. The right common femoral artery approach vascular sheath was secured at the right groin access site within interrupted suture and connected to a pressure bag.  Attention was now paid towards placement of the temporary dialysis catheter.  The left femoral head was marked fluoroscopically. Under direct ultrasound guidance, the left common femoral vein was accessed with a micropuncture kit. The track was dilated under intermittent fluoroscopic guidance ultimately allowing placement of a 20 cm non tunneled dialysis dialysis catheter with tip terminating within the distal aspect of the IVC. All lumens were noted to easily aspirate and flush peer and all lumens were flushed with appropriate heparin wells. Concern for dialysis catheter was secured in place within interrupted suture.  Dressings were applied. The patient tolerated the above procedure well without immediate postprocedural complication and remained hemodynamically stable throughout the procedure.  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  Selective left gastric and accessory left hepatic arteriograms demonstrates an ill-defined area of active extravasation arising from an anterior segmental branch of the medial segment of the left lobe of the liver correlating with prior biopsy site.  Diffuse vessel spasm was noted about this distal vessel and as such, sub selective arteriogram and coil embolization could not be performed. Rather the anterior segmental vascular distribution was particle embolized with Embospheres.  Completion arteriogram demonstrates complete stasis of the anterior segmental branch of the accessory left hepatic artery with no additional areas of active extravasation.  Following ultrasound fluoroscopic guided placement, the temporary dialysis catheter tip terminates within the caudal aspect of the IVC.  IMPRESSION: 1. Technically successful particle embolization of the anterior division of the accessory left hepatic artery for active hepatic arterial extravasation. 2. Successful ultrasound fluoroscopic guided placement of a left common femoral vein approach temporary dialysis catheter with tip terminating within the caudal aspect of the IVC. The temporary dialysis catheter is ready for immediate use.   Electronically Signed   By: Sandi Mariscal  M.D.   On: 04/18/2015 10:32   Ir Angiogram Selective Each Additional Vessel  04/18/2015   INDICATION: History of cirrhosis of uncertain etiology post ultrasound-guided liver biopsy on 04/17/2015, now with hemodynamic instability and postprocedural CT scan demonstrated active extravasation from the left lobe of the liver. Please perform mesenteric arteriogram and possible embolization.  Patient also with acute renal insufficiency. Please perform ultrasound fluoroscopic guided temporary dialysis catheter  placement.  EXAM: 1. ULTRASOUND GUIDANCE FOR ARTERIAL ACCESS 2. CELIAC ARTERIOGRAM 3. SELECTIVE LEFT GASTRIC ARTERIOGRAM 4. SELECTIVE ACCESSORY LEFT HEPATIC ARTERIOGRAM 5. SELECTIVE ARTERIOGRAM OF THE ANTERIOR DIVISION OF THE ACCESSORY LEFT HEPATIC ARTERY AND PERCUTANEOUS PARTICLE EMBOLIZATION 6. ULTRASOUND AND FLUOROSCOPIC GUIDED LEFT COMMON FEMORAL VEIN APPROACH TEMPORARY DIALYSIS CATHETER.  COMPARISON:  CT the abdomen pelvis - 04/18/2015; ultrasound-guided random liver biopsy - 04/17/2015  MEDICATIONS: Patient intubated and sedated with continuous monitoring via the ICU staff.  CONTRAST:  72m OMNIPAQUE IOHEXOL 300 MG/ML SOLN, 566mOMNIPAQUE IOHEXOL 300 MG/ML SOLN  FLUOROSCOPY TIME:  1 minutes 42 seconds (1,1,245Gy)  COMPLICATIONS: None immediate  TECHNIQUE: Informed written consent was obtained from the patient's father after a discussion of the risks, benefits and alternatives to treatment. Questions regarding the procedure were encouraged and answered. A timeout was performed prior to the initiation of the procedure.  The bilateral groins were prepped and draped in the usual sterile fashion, and a sterile drape was applied covering the operative field. Maximum barrier sterile technique with sterile gowns and gloves were used for the procedure. A timeout was performed prior to the initiation of the procedure. Local anesthesia was provided with 1% lidocaine.  The right femoral head was marked fluoroscopically. Under ultrasound guidance, the right common femoral artery was accessed with a micropuncture kit after the overlying soft tissues were anesthetized with 1% lidocaine. An ultrasound image was saved for documentation purposes. The micropuncture sheath was exchanged for a 5 [place] sheath over a Bentson wire. A closure arteriogram was performed through the side of the sheath confirming access within the right common femoral artery.  Over a Bentson wire, a Mickelson catheter was advanced to the level  of the thoracic aorta where it was back bled and flushed. The catheter was then utilized to select the celiac artery and a celiac arteriogram was performed.  The Mickelson catheter was retracted to select the left gastric artery and a left gastric arteriogram was performed.  With the use of a Fathom micro wire, a high-flow Renegade micro catheter was advanced into the accessory left hepatic artery and a selective accessory left hepatic arteriogram was performed.  Over an exchange length microwire a regular Renegade micro catheter was advanced into the anterior segmental branch of the accessory left hepatic artery and a selective arteriogram was performed.  Despite prolonged efforts, the micro catheter could not be advanced into the tiny branch vessel of the left hepatic artery supplying the area of persistent active extravasation. As such, particle embolization was performed of the anterior dictation of the left lateral hepatic artery with 700 to 900 micron Embospheres.  The micro catheter was retracted into the accessory left hepatic artery and a completion accessory left hepatic arteriogram was performed. All wires and catheters were removed from the patient. The right common femoral artery approach vascular sheath was secured at the right groin access site within interrupted suture and connected to a pressure bag.  Attention was now paid towards placement of the temporary dialysis catheter.  The left femoral head was marked fluoroscopically. Under direct  ultrasound guidance, the left common femoral vein was accessed with a micropuncture kit. The track was dilated under intermittent fluoroscopic guidance ultimately allowing placement of a 20 cm non tunneled dialysis dialysis catheter with tip terminating within the distal aspect of the IVC. All lumens were noted to easily aspirate and flush peer and all lumens were flushed with appropriate heparin wells. Concern for dialysis catheter was secured in place within  interrupted suture.  Dressings were applied. The patient tolerated the above procedure well without immediate postprocedural complication and remained hemodynamically stable throughout the procedure.  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  Selective left gastric and accessory left hepatic arteriograms demonstrates an ill-defined area of active extravasation arising from an anterior segmental branch of the medial segment of the left lobe of the liver correlating with prior biopsy site.  Diffuse vessel spasm was noted about this distal vessel and as such, sub selective arteriogram and coil embolization could not be performed. Rather the anterior segmental vascular distribution was particle embolized with Embospheres.  Completion arteriogram demonstrates complete stasis of the anterior segmental branch of the accessory left hepatic artery with no additional areas of active extravasation.  Following ultrasound fluoroscopic guided placement, the temporary dialysis catheter tip terminates within the caudal aspect of the IVC.  IMPRESSION: 1. Technically successful particle embolization of the anterior division of the accessory left hepatic artery for active hepatic arterial extravasation. 2. Successful ultrasound fluoroscopic guided placement of a left common femoral vein approach temporary dialysis catheter with tip terminating within the caudal aspect of the IVC. The temporary dialysis catheter is ready for immediate use.   Electronically Signed   By: Sandi Mariscal M.D.   On: 04/18/2015 10:32   US Biopsy  04/17/2015   CLINICAL DATA:  29 year old with sickle cell anemia and cirrhosis. Request for liver biopsy.  EXAM: ULTRASOUND GUIDED LIVER BIOPSY  Physician: Stephan Minister. Anselm Pancoast, MD  FLUOROSCOPY TIME:  None  MEDICATIONS: 3 mg Versed, 75  mcg fentanyl. A radiology nurse monitored the patient for moderate sedation.  ANESTHESIA/SEDATION: Moderate sedation time: 20 minutes  PROCEDURE: The procedure was explained to the patient. The risks and benefits of the procedure were discussed and the patient's questions were addressed. Informed consent was obtained from the patient. The liver was evaluated with ultrasound. Left hepatic lobe was selected for biopsy. The anterior abdomen was prepped and draped in sterile fashion. 1% lidocaine was used for local anesthetic. Using ultrasound guidance, 17 gauge needle was directed into the left hepatic lobe. Three core biopsies were obtained with an 18 gauge core device. The tract was embolized with Gel-Foam. Bandage placed over the puncture site.  FINDINGS: The liver has a nodular contour. Small amount of ascites along the right side of the liver. Left hepatic lobe was selected for biopsy because there was no adjacent ascites. No evidence for bleeding or hematoma following the core biopsies.  Estimated blood loss: Minimal  COMPLICATIONS: None  IMPRESSION: Ultrasound-guided core biopsies of the left hepatic lobe.   Electronically Signed   By: Markus Daft M.D.   On: 04/17/2015 13:02   Ir Fluoro Guide Cv Line Left  04/18/2015   INDICATION: History of cirrhosis of uncertain etiology post ultrasound-guided liver biopsy on 04/17/2015, now with hemodynamic instability and postprocedural CT scan demonstrated active extravasation from the left lobe of the  liver. Please perform mesenteric arteriogram and possible embolization.  Patient also with acute renal insufficiency. Please perform ultrasound fluoroscopic guided temporary dialysis catheter placement.  EXAM: 1. ULTRASOUND GUIDANCE FOR ARTERIAL ACCESS 2. CELIAC ARTERIOGRAM 3. SELECTIVE LEFT GASTRIC ARTERIOGRAM 4. SELECTIVE ACCESSORY LEFT HEPATIC ARTERIOGRAM 5. SELECTIVE ARTERIOGRAM OF THE ANTERIOR DIVISION OF THE ACCESSORY LEFT HEPATIC ARTERY AND PERCUTANEOUS PARTICLE  EMBOLIZATION 6. ULTRASOUND AND FLUOROSCOPIC GUIDED LEFT COMMON FEMORAL VEIN APPROACH TEMPORARY DIALYSIS CATHETER.  COMPARISON:  CT the abdomen pelvis - 04/18/2015; ultrasound-guided random liver biopsy - 04/17/2015  MEDICATIONS: Patient intubated and sedated with continuous monitoring via the ICU staff.  CONTRAST:  48m OMNIPAQUE IOHEXOL 300 MG/ML SOLN, 556mOMNIPAQUE IOHEXOL 300 MG/ML SOLN  FLUOROSCOPY TIME:  1 minutes 42 seconds (1,5,053Gy)  COMPLICATIONS: None immediate  TECHNIQUE: Informed written consent was obtained from the patient's father after a discussion of the risks, benefits and alternatives to treatment. Questions regarding the procedure were encouraged and answered. A timeout was performed prior to the initiation of the procedure.  The bilateral groins were prepped and draped in the usual sterile fashion, and a sterile drape was applied covering the operative field. Maximum barrier sterile technique with sterile gowns and gloves were used for the procedure. A timeout was performed prior to the initiation of the procedure. Local anesthesia was provided with 1% lidocaine.  The right femoral head was marked fluoroscopically. Under ultrasound guidance, the right common femoral artery was accessed with a micropuncture kit after the overlying soft tissues were anesthetized with 1% lidocaine. An ultrasound image was saved for documentation purposes. The micropuncture sheath was exchanged for a 5 [place] sheath over a Bentson wire. A closure arteriogram was performed through the side of the sheath confirming access within the right common femoral artery.  Over a Bentson wire, a Mickelson catheter was advanced to the level of the thoracic aorta where it was back bled and flushed. The catheter was then utilized to select the celiac artery and a celiac arteriogram was performed.  The Mickelson catheter was retracted to select the left gastric artery and a left gastric arteriogram was performed.  With  the use of a Fathom micro wire, a high-flow Renegade micro catheter was advanced into the accessory left hepatic artery and a selective accessory left hepatic arteriogram was performed.  Over an exchange length microwire a regular Renegade micro catheter was advanced into the anterior segmental branch of the accessory left hepatic artery and a selective arteriogram was performed.  Despite prolonged efforts, the micro catheter could not be advanced into the tiny branch vessel of the left hepatic artery supplying the area of persistent active extravasation. As such, particle embolization was performed of the anterior dictation of the left lateral hepatic artery with 700 to 900 micron Embospheres.  The micro catheter was retracted into the accessory left hepatic artery and a completion accessory left hepatic arteriogram was performed. All wires and catheters were removed from the patient. The right common femoral artery approach vascular sheath was secured at the right groin access site within interrupted suture and connected to a pressure bag.  Attention was now paid towards placement of the temporary dialysis catheter.  The left femoral head was marked fluoroscopically. Under direct ultrasound guidance, the left common femoral vein was accessed with a micropuncture kit. The track was dilated under intermittent fluoroscopic guidance ultimately allowing placement of a 20 cm non tunneled dialysis dialysis catheter with tip terminating within the distal aspect of the IVC. All lumens were noted to  easily aspirate and flush peer and all lumens were flushed with appropriate heparin wells. Concern for dialysis catheter was secured in place within interrupted suture.  Dressings were applied. The patient tolerated the above procedure well without immediate postprocedural complication and remained hemodynamically stable throughout the procedure.  FINDINGS: Celiac arteriogram confirms the presence of an accessory left hepatic  artery supplying the lateral segment of the left lobe of the liver arising from the left gastric artery as was demonstrated on preceding abdominal CT.  Selective left gastric and accessory left hepatic arteriograms demonstrates an ill-defined area of active extravasation arising from an anterior segmental branch of the medial segment of the left lobe of the liver correlating with prior biopsy site.  Diffuse vessel spasm was noted about this distal vessel and as such, sub selective arteriogram and coil embolization could not be performed. Rather the anterior segmental vascular distribution was particle embolized with Embospheres.  Completion arteriogram demonstrates complete stasis of the anterior segmental branch of the accessory left hepatic artery with no additional areas of active extravasation.  Following ultrasound fluoroscopic guided placement, the temporary dialysis catheter tip terminates within the caudal aspect of the IVC.  IMPRESSION: 1. Technically successful particle embolization of the anterior division of the accessory left hepatic artery for active hepatic arterial extravasation. 2. Successful ultrasound fluoroscopic guided placement of a left common femoral vein approach temporary dialysis catheter with tip terminating within the caudal aspect of the IVC. The temporary dialysis catheter is ready for immediate use.   Electronically Signed   By: Sandi Mariscal M.D.   On: 04/18/2015 10:32   Ir US Guide Vasc Access Left  04/18/2015   INDICATION: History of cirrhosis of uncertain etiology post ultrasound-guided liver biopsy on 04/17/2015, now with hemodynamic instability and postprocedural CT scan demonstrated active extravasation from the left lobe of the liver. Please perform mesenteric arteriogram and possible embolization.  Patient also with acute renal insufficiency. Please perform ultrasound fluoroscopic guided temporary dialysis catheter placement.  EXAM: 1. ULTRASOUND GUIDANCE FOR ARTERIAL ACCESS  2. CELIAC ARTERIOGRAM 3. SELECTIVE LEFT GASTRIC ARTERIOGRAM 4. SELECTIVE ACCESSORY LEFT HEPATIC ARTERIOGRAM 5. SELECTIVE ARTERIOGRAM OF THE ANTERIOR DIVISION OF THE ACCESSORY LEFT HEPATIC ARTERY AND PERCUTANEOUS PARTICLE EMBOLIZATION 6. ULTRASOUND AND FLUOROSCOPIC GUIDED LEFT COMMON FEMORAL VEIN APPROACH TEMPORARY DIALYSIS CATHETER.  COMPARISON:  CT the abdomen pelvis - 04/18/2015; ultrasound-guided random liver biopsy - 04/17/2015  MEDICATIONS: Patient intubated and sedated with continuous monitoring via the ICU staff.  CONTRAST:  80m OMNIPAQUE IOHEXOL 300 MG/ML SOLN, 571mOMNIPAQUE IOHEXOL 300 MG/ML SOLN  FLUOROSCOPY TIME:  1 minutes 42 seconds (1,6,270Gy)  COMPLICATIONS: None immediate  TECHNIQUE: Informed written consent was obtained from the patient's father after a discussion of the risks, benefits and alternatives to treatment. Questions regarding the procedure were encouraged and answered. A timeout was performed prior to the initiation of the procedure.  The bilateral groins were prepped and draped in the usual sterile fashion, and a sterile drape was applied covering the operative field. Maximum barrier sterile technique with sterile gowns and gloves were used for the procedure. A timeout was performed prior to the initiation of the procedure. Local anesthesia was provided with 1% lidocaine.  The right femoral head was marked fluoroscopically. Under ultrasound guidance, the right common femoral artery was accessed with a micropuncture kit after the overlying soft tissues were anesthetized with 1% lidocaine. An ultrasound image was saved for documentation purposes. The micropuncture sheath was exchanged for a 5 [place] sheath over a Bentson wire.  A closure arteriogram was performed through the side of the sheath confirming access within the right common femoral artery.  Over a Bentson wire, a Mickelson catheter was advanced to the level of the thoracic aorta where it was back bled and flushed. The  catheter was then utilized to select the celiac artery and a celiac arteriogram was performed.  The Mickelson catheter was retracted to select the left gastric artery and a left gastric arteriogram was performed.  With the use of a Fathom micro wire, a high-flow Renegade micro catheter was advanced into the accessory left hepatic artery and a selective accessory left hepatic arteriogram was performed.  Over an exchange length microwire a regular Renegade micro catheter was advanced into the anterior segmental branch of the accessory left hepatic artery and a selective arteriogram was performed.  Despite prolonged efforts, the micro catheter could not be advanced into the tiny branch vessel of the left hepatic artery supplying the area of persistent active extravasation. As such, particle embolization was performed of the anterior dictation of the left lateral hepatic artery with 700 to 900 micron Embospheres.  The micro catheter was retracted into the accessory left hepatic artery and a completion accessory left hepatic arteriogram was performed. All wires and catheters were removed from the patient. The right common femoral artery approach vascular sheath was secured at the right groin access site within interrupted suture and connected to a pressure bag.  Attention was now paid towards placement of the temporary dialysis catheter.  The left femoral head was marked fluoroscopically. Under direct ultrasound guidance, the left common femoral vein was accessed with a micropuncture kit. The track was dilated under intermittent fluoroscopic guidance ultimately allowing placement of a 20 cm non tunneled dialysis dialysis catheter with tip terminating within the distal aspect of the IVC. All lumens were noted to easily aspirate and flush peer and all lumens were flushed with appropriate heparin wells. Concern for dialysis catheter was secured in place within interrupted suture.  Dressings were applied. The patient  tolerated the above procedure well without immediate postprocedural complication and remained hemodynamically stable throughout the procedure.  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  Selective left gastric and accessory left hepatic arteriograms demonstrates an ill-defined area of active extravasation arising from an anterior segmental branch of the medial segment of the left lobe of the liver correlating with prior biopsy site.  Diffuse vessel spasm was noted about this distal vessel and as such, sub selective arteriogram and coil embolization could not be performed. Rather the anterior segmental vascular distribution was particle embolized with Embospheres.  Completion arteriogram demonstrates complete stasis of the anterior segmental branch of the accessory left hepatic artery with no additional areas of active extravasation.  Following ultrasound fluoroscopic guided placement, the temporary dialysis catheter tip terminates within the caudal aspect of the IVC.  IMPRESSION: 1. Technically successful particle embolization of the anterior division of the accessory left hepatic artery for active hepatic arterial extravasation. 2. Successful ultrasound fluoroscopic guided placement of a left common femoral vein approach temporary dialysis catheter with tip terminating within the caudal aspect of the IVC. The temporary dialysis catheter is ready for immediate use.   Electronically Signed   By: Sandi Mariscal M.D.   On: 04/18/2015 10:32   Ir US Guide Vasc Access Right  04/18/2015  INDICATION: History of cirrhosis of uncertain etiology post ultrasound-guided liver biopsy on 04/17/2015, now with hemodynamic instability and postprocedural CT scan demonstrated active extravasation from the left lobe of the liver. Please perform  mesenteric arteriogram and possible embolization.  Patient also with acute renal insufficiency. Please perform ultrasound fluoroscopic guided temporary dialysis catheter placement.  EXAM: 1. ULTRASOUND GUIDANCE FOR ARTERIAL ACCESS 2. CELIAC ARTERIOGRAM 3. SELECTIVE LEFT GASTRIC ARTERIOGRAM 4. SELECTIVE ACCESSORY LEFT HEPATIC ARTERIOGRAM 5. SELECTIVE ARTERIOGRAM OF THE ANTERIOR DIVISION OF THE ACCESSORY LEFT HEPATIC ARTERY AND PERCUTANEOUS PARTICLE EMBOLIZATION 6. ULTRASOUND AND FLUOROSCOPIC GUIDED LEFT COMMON FEMORAL VEIN APPROACH TEMPORARY DIALYSIS CATHETER.  COMPARISON:  CT the abdomen pelvis - 04/18/2015; ultrasound-guided random liver biopsy - 04/17/2015  MEDICATIONS: Patient intubated and sedated with continuous monitoring via the ICU staff.  CONTRAST:  7m OMNIPAQUE IOHEXOL 300 MG/ML SOLN, 531mOMNIPAQUE IOHEXOL 300 MG/ML SOLN  FLUOROSCOPY TIME:  1 minutes 42 seconds (1,3,825Gy)  COMPLICATIONS: None immediate  TECHNIQUE: Informed written consent was obtained from the patient's father after a discussion of the risks, benefits and alternatives to treatment. Questions regarding the procedure were encouraged and answered. A timeout was performed prior to the initiation of the procedure.  The bilateral groins were prepped and draped in the usual sterile fashion, and a sterile drape was applied covering the operative field. Maximum barrier sterile technique with sterile gowns and gloves were used for the procedure. A timeout was performed prior to the initiation of the procedure. Local anesthesia was provided with 1% lidocaine.  The right femoral head was marked fluoroscopically. Under ultrasound guidance, the right common femoral artery was accessed with a micropuncture kit after the overlying soft tissues were anesthetized with 1% lidocaine. An ultrasound image was saved for documentation purposes. The micropuncture sheath was exchanged for a 5 Fr[place]ascular sheath over a Bentson wire. A closure arteriogram was  performed through the side of the sheath confirming access within the right common femoral artery.  Over a Bentson wire, a Mickelson catheter was advanced to the level of the thoracic aorta where it was back bled and flushed. The catheter was then utilized to select the celiac artery and a celiac arteriogram was performed.  The Mickelson catheter was retracted to select the left gastric artery and a left gastric arteriogram was performed.  With the use of a Fathom micro wire, a high-flow Renegade micro catheter was advanced into the accessory left hepatic artery and a selective accessory left hepatic arteriogram was performed.  Over an exchange length microwire a regular Renegade micro catheter was advanced into the anterior segmental branch of the accessory left hepatic artery and a selective arteriogram was performed.  Despite prolonged efforts, the micro catheter could not be advanced into the tiny branch vessel of the left hepatic artery supplying the area of persistent active extravasation. As such, particle embolization was performed of the anterior dictation of the left lateral hepatic artery with 700 to 900 micron Embospheres.  The micro catheter was retracted into the accessory left hepatic artery and a completion accessory left hepatic arteriogram was performed. All wires and catheters were removed from the patient. The right common femoral artery approach vascular sheath was secured at the right groin access site within interrupted suture and connected to a pressure bag.  Attention was now paid towards placement of the temporary dialysis catheter.  The left femoral head was marked fluoroscopically. Under direct ultrasound guidance, the left common femoral vein was accessed with a micropuncture kit. The track was dilated under  intermittent fluoroscopic guidance ultimately allowing placement of a 20 cm non tunneled dialysis dialysis catheter with tip terminating within the distal aspect of the IVC. All  lumens were noted to easily aspirate and flush peer and all lumens were flushed with appropriate heparin wells. Concern for dialysis catheter was secured in place within interrupted suture.  Dressings were applied. The patient tolerated the above procedure well without immediate postprocedural complication and remained hemodynamically stable throughout the procedure.  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  Selective left gastric and accessory left hepatic arteriograms demonstrates an ill-defined area of active extravasation arising from an anterior segmental branch of the medial segment of the left lobe of the liver correlating with prior biopsy site.  Diffuse vessel spasm was noted about this distal vessel and as such, sub selective arteriogram and coil embolization could not be performed. Rather the anterior segmental vascular distribution was particle embolized with Embospheres.  Completion arteriogram demonstrates complete stasis of the anterior segmental branch of the accessory left hepatic artery with no additional areas of active extravasation.  Following ultrasound fluoroscopic guided placement, the temporary dialysis catheter tip terminates within the caudal aspect of the IVC.  IMPRESSION: 1. Technically successful particle embolization of the anterior division of the accessory left hepatic artery for active hepatic arterial extravasation. 2. Successful ultrasound fluoroscopic guided placement of a left common femoral vein approach temporary dialysis catheter with tip terminating within the caudal aspect of the IVC. The temporary dialysis catheter is ready for immediate use.   Electronically Signed   By: Sandi Mariscal M.D.   On: 04/18/2015 10:32   Dg Chest Port 1 View  04/19/2015   CLINICAL DATA:  Respiratory  failure.  Endotracheal intubation.  EXAM: PORTABLE CHEST - 1 VIEW  COMPARISON:  04/17/2015  FINDINGS: The endotracheal tube is 1.7 cm above the carina. The nasogastric tube extends into the stomach. The right jugular central line extends into the right atrium. There is no pneumothorax. There is no large effusion. There is unchanged cardiomegaly. There is no confluent airspace consolidation.  IMPRESSION: Support equipment appears satisfactorily positioned. The lungs are grossly clear.   Electronically Signed   By: Andreas Newport M.D.   On: 04/19/2015 06:32   Dg Chest Port 1 View  04/18/2015   CLINICAL DATA:  Endotracheal tube and central line placements  EXAM: PORTABLE CHEST - 1 VIEW  COMPARISON:  04/17/2015  FINDINGS: The endotracheal tube tip is 1.5 cm above the carina. The nasogastric tube extends into the stomach. There is a left jugular central line with tip in the cavoatrial junction. There is no pneumothorax. There is no dense airspace consolidation. There is no large effusion.  IMPRESSION: Support equipment appears satisfactorily positioned.   Electronically Signed   By: Andreas Newport M.D.   On: 04/18/2015 00:40   Dg Chest Port 1 View  04/17/2015   CLINICAL DATA:  Shortness of Breath  EXAM: PORTABLE CHEST - 1 VIEW  COMPARISON:  April 03, 2015  FINDINGS: Degree of inspiration is shallow. There is no edema or consolidation. Heart size and pulmonary vascularity are within normal limits given the degree of shallow inspiratory effort. No adenopathy. No bone lesions. No pneumothorax.  IMPRESSION: No edema or consolidation.  Note shallow degree of inspiration.   Electronically Signed   By: Lowella Grip III M.D.  On: 04/17/2015 20:21   Dg Abd Portable 1v  04/17/2015   CLINICAL DATA:  29 year old female abdominal pain and distention x2 weeks.  EXAM: PORTABLE ABDOMEN - 1 VIEW  COMPARISON:  Abdominal ultrasound dated 04/13/2015 and MRI dated 04/12/2015.  FINDINGS: This is a nonspecific bowel gas  pattern. Moderate stool noted throughout the colon. Right upper quadrant cholecystectomy clips. The osseous structures appear unremarkable.  IMPRESSION: Nonspecific bowel gas pattern without definite evidence of obstruction.   Electronically Signed   By: Anner Crete M.D.   On: 04/17/2015 20:26   Ir Embo Arterial Not [place]  04/18/2015   CLINICAL DATA:  29 year old female with a history of recent percutaneous liver biopsy for concern of cirrhosis diagnosis.  The patient subsequently developed intra abdominal hemorrhage and underwent embolization of left hepatic artery branches on the same day. Given that the patient is coagulopathic and is hypothermic, it seems as though she has continued to bleed through the particle embolization, with low hemoglobin and downward trend of her blood pressure.  She presents for mesenteric angiogram and potential further embolization.  EXAM: MESENTERIC ANGIOGRAM, WITH ANGIOGRAM OF THE CELIAC ARTERY, LEFT GASTRIC ARTERY, LEFT HEPATIC ARTERY, AND SEGMENTAL BRANCHES OF THE LEFT HEPATIC ARTERY.  COIL EMBOLIZATION OF INFERIOR SEGMENT LEFT HEPATIC ARTERY.  ANESTHESIA/SEDATION: The patient is intubated, with management from respiratory therapy team.  2.0 Mg IV Versed; fentanyl infusion  Contrast Volume: 120 cc Omni 300  Additional Medications: 5 under mcg nitroglycerin, 2.5 mg intra-arterial verapamil  FLUOROSCOPY TIME:  29 minutes, 12 seconds Minutes.  PROCEDURE: The procedure, risks, benefits, and alternatives were explained to the patient's family. Questions regarding the procedure were encouraged and answered. The patient understands and consents to the procedure.  The indwelling right common femoral artery sheath was prepped and draped in the usual sterile fashion, as well as the right groin, which was prepped with Betadine in a sterile fashion, and a sterile drape was applied covering the operative field.  Wire was advanced through the indwelling  sheath, which was then exchanged over the wire for a 35 cm 5 French sheath. This was attached to a pressure bag with heparinized saline.  A 5 [place] Mickelson catheter was then used to select the celiac artery and a celiac artery angiogram was performed with power injection.  The Mickelson catheter and a Glidewire were used to select the left gastric artery, with the [facility] the catheter advanced over the Glidewire into the proximal left gastric artery.  Left gastric artery angiogram was performed.  100 mcg of nitroglycerin were then infused intra arterial.  A renegade STC catheter was then advanced over a 014 fathom wire through the left gastric artery to select the the replaced left hepatic artery, with sub selection of the inferior segments of the left hepatic artery, which were visualized to contribute to ongoing contrast pooling.  Sub selection of distal branches was performed with coil embolization with 2 mm and 3 mm diameter interlock coils.  Repeat angiogram of the segments demonstrated on going filling of the target vasculature, and further coil embolization of inferior segment of the left hepatic artery was required.  Gel-Foam slurry was infused after coil embolization of the downgoing/inferior segment.  Repeat angiogram demonstrated no further filling of the target vasculature.  No complications were encountered.  No significant blood loss was encountered during the procedure.  Patient remained hemodynamically stable through the procedure.  COMPLICATIONS: None.  FINDINGS: Angiogram of the celiac artery demonstrates variant anatomy of  the hepatic vasculature, seen on prior study. Replaced left hepatic artery from left gastric artery again visualized, which was selected.  Angiogram demonstrates vessel spasm with attenuation of multiple distal branches, compatible with the patient's current hypovolemic shock.  Selected angiography of segments of the left hepatic artery, segment 4 branch, demonstrate  ongoing contrast pooling in the similar location to the comparison study.  After coil embolization of the targeted vasculature, there is no further filling on contrast injection.  IMPRESSION: Status post mesenteric angiogram with sub selection of replaced left hepatic artery via the left gastric artery, with coil embolization of segment 4 branches of the left hepatic artery, which occluded flow to the target vasculature. No further flow to the targeted vessels identified on the completion angiogram.  Signed,  Dulcy Fanny. Earleen Newport, DO  [facility]  [facility]   Electronically Signed   By: Corrie Mckusick D.O.   On: 04/18/2015 17:22   Orchid Guide Roadmapping  04/18/2015   INDICATION: History of cirrhosis of uncertain etiology post ultrasound-guided liver biopsy on 04/17/2015, now with hemodynamic instability and postprocedural CT scan demonstrated active extravasation from the left lobe of the liver. Please perform mesenteric arteriogram and possible embolization.  Patient also with acute renal insufficiency. Please perform ultrasound fluoroscopic guided temporary dialysis catheter placement.  EXAM: 1. ULTRASOUND GUIDANCE FOR ARTERIAL ACCESS 2. CELIAC ARTERIOGRAM 3. SELECTIVE LEFT GASTRIC ARTERIOGRAM 4. SELECTIVE ACCESSORY LEFT HEPATIC ARTERIOGRAM 5. SELECTIVE ARTERIOGRAM OF THE ANTERIOR DIVISION OF THE ACCESSORY LEFT HEPATIC ARTERY AND PERCUTANEOUS PARTICLE EMBOLIZATION 6. ULTRASOUND AND FLUOROSCOPIC GUIDED LEFT COMMON FEMORAL VEIN APPROACH TEMPORARY DIALYSIS CATHETER.  COMPARISON:  CT the abdomen pelvis - 04/18/2015; ultrasound-guided random liver biopsy - 04/17/2015  MEDICATIONS: Patient intubated and sedated with continuous monitoring via the ICU staff.  CONTRAST:  31m OMNIPAQUE IOHEXOL 300 MG/ML SOLN, 565mOMNIPAQUE IOHEXOL 300 MG/ML SOLN  FLUOROSCOPY TIME:  1 minutes 42 seconds (1,2,355Gy)  COMPLICATIONS: None immediate  TECHNIQUE:  Informed written consent was obtained from the patient's father after a discussion of the risks, benefits and alternatives to treatment. Questions regarding the procedure were encouraged and answered. A timeout was performed prior to the initiation of the procedure.  The bilateral groins were prepped and draped in the usual sterile fashion, and a sterile drape was applied covering the operative field. Maximum barrier sterile technique with sterile gowns and gloves were used for the procedure. A timeout was performed prior to the initiation of the procedure. Local anesthesia was provided with 1% lidocaine.  The right femoral head was marked fluoroscopically. Under ultrasound guidance, the right common femoral artery was accessed with a micropuncture kit after the overlying soft tissues were anesthetized with 1% lidocaine. An ultrasound image was saved for documentation purposes. The micropuncture sheath was exchanged for a 5 Fr[place]ascular sheath over a Bentson wire. A closure arteriogram was performed through the side of the sheath confirming access within the right common femoral artery.  Over a Bentson wire, a Mickelson catheter was advanced to the level of the thoracic aorta where it was back bled and flushed. The catheter was then utilized to select the celiac artery and a celiac arteriogram was performed.  The Mickelson catheter was retracted to select the left gastric artery and a left gastric arteriogram was performed.  With the use of a Fathom micro wire, a high-flow Renegade micro catheter was advanced into the accessory left hepatic artery and a selective accessory left  hepatic arteriogram was performed.  Over an exchange length microwire a regular Renegade micro catheter was advanced into the anterior segmental branch of the accessory left hepatic artery and a selective arteriogram was performed.  Despite prolonged efforts, the micro catheter could not be advanced into the tiny branch vessel of the left  hepatic artery supplying the area of persistent active extravasation. As such, particle embolization was performed of the anterior dictation of the left lateral hepatic artery with 700 to 900 micron Embospheres.  The micro catheter was retracted into the accessory left hepatic artery and a completion accessory left hepatic arteriogram was performed. All wires and catheters were removed from the patient. The right common femoral artery approach vascular sheath was secured at the right groin access site within interrupted suture and connected to a pressure bag.  Attention was now paid towards placement of the temporary dialysis catheter.  The left femoral head was marked fluoroscopically. Under direct ultrasound guidance, the left common femoral vein was accessed with a micropuncture kit. The track was dilated under intermittent fluoroscopic guidance ultimately allowing placement of a 20 cm non tunneled dialysis dialysis catheter with tip terminating within the distal aspect of the IVC. All lumens were noted to easily aspirate and flush peer and all lumens were flushed with appropriate heparin wells. Concern for dialysis catheter was secured in place within interrupted suture.  Dressings were applied. The patient tolerated the above procedure well without immediate postprocedural complication and remained hemodynamically stable throughout the procedure.  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  Selective left gastric and accessory left hepatic arteriograms demonstrates an ill-defined area of active extravasation arising from an anterior segmental branch of the medial segment of the left lobe of the liver correlating with prior biopsy site.  Diffuse vessel spasm was noted about this distal vessel and as such, sub selective  arteriogram and coil embolization could not be performed. Rather the anterior segmental vascular distribution was particle embolized with Embospheres.  Completion arteriogram demonstrates complete stasis of the anterior segmental branch of the accessory left hepatic artery with no additional areas of active extravasation.  Following ultrasound fluoroscopic guided placement, the temporary dialysis catheter tip terminates within the caudal aspect of the IVC.  IMPRESSION: 1. Technically successful particle embolization of the anterior division of the accessory left hepatic artery for active hepatic arterial extravasation. 2. Successful ultrasound fluoroscopic guided placement of a left common femoral vein approach temporary dialysis catheter with tip terminating within the caudal aspect of the IVC. The temporary dialysis catheter is ready for immediate use.   Electronically Signed   By: Sandi Mariscal M.D.   On: 04/18/2015 10:32    Labs:  CBC:  Recent Labs  04/17/15 2037 04/18/15 0222 04/18/15 1110 04/18/15 2020 04/19/15 0500  WBC 30.5* 32.3*  --  22.5* 25.2*  HGB 4.7* 5.5* 5.7* 7.6* 8.1*  HCT 13.8* 16.7* 17.0* 22.4* 22.9*  PLT 89* 112*  --  185  167 176    COAGS:  Recent Labs  04/12/15 1300 04/17/15 2244 04/18/15 1110 04/18/15 2020 04/19/15 0456  INR 1.28 2.09* 1.90* 1.86*  1.88* 2.01*  APTT 47*  --  78* 68*  --     BMP:  Recent Labs  04/18/15 0222 04/18/15 1110 04/18/15 2020 04/19/15 0456  NA 138 141 141 139  K 7.2*  4.7 4.3 4.0  CL 114* 108 107 106  CO2 9* 15* 14* 16*  GLUCOSE 96 102* 74 94  BUN 21* 20 20 17   CALCIUM 7.5* 7.1* 6.4* 6.1*  CREATININE 1.09* 0.61 0.61 0.39*  GFRNONAA >60 >60 >60 >60  GFRAA >60 >60 >60 >60    LIVER FUNCTION TESTS:  Recent Labs  04/17/15 0353 04/18/15 0222 04/18/15 2020 04/19/15 0456  BILITOT 27.9* 25.5* 33.7* 35.4*  AST 347* 948* 6302* 9238*  ALT 101* 183* 981* 1331*  ALKPHOS 506* 345* 291* 354*  PROT 5.6* 4.6* 4.9* 5.0*    ALBUMIN 1.5* 1.2* 2.0* 1.9*  1.9*    Assessment and Plan: Pt with hx SCC, abnormal liver function; s/p left hepatic lobe core biopsy 6/28 with subsequent hemoperitoneum, acute kidney injury, VDRL, hemorrhagic shock; status post particle embolization of anterior division of accessory left hepatic artery 6/29, additional coil embolization of segment 4 branches of the left hepatic artery on 6/29; surgical pathology pending; temp 95.5; WBC 25.2, hemoglobin stable at 8.1, platelets 176,000, fibrinogen 175 creatinine 0.39, LFTs increased with total bilirubin now 35.4, PT/INR up to 22.7/2.01; patient also seen by Dr. Anselm Pancoast this am and family updated;  plans as outlined by critical care and nephrology.   Signed: D. Rowe Joan Martin 04/19/2015, 11:22 AM   I spent a total of 15 minutes in face to face in clinical consultation/evaluation, greater than 50% of which was counseling/coordinating care for post left hepatic artery embolization

## 2015-04-19 NOTE — Progress Notes (Addendum)
ANTIBIOTIC CONSULT NOTE - FOLLOW UP  Pharmacy Consult for Vancomycin/Primaxin Indication: r/o HCAP  Allergies  Allergen Reactions  . Cephalosporins     Possible family reaction  . Citrus Other (See Comments)    Severity varies/ Reaction varies hives around mouth to swelling around mouth and lips.   . Morphine And Related Hives    Patient states she can take IV dilaudid with benadryl    Patient Measurements: Height:  (170.2 cm) Weight: 172 lb 2.9 oz (78.1 kg) IBW/kg (Calculated) : 61.6  Vital Signs: Temp: 95.5 F (35.3 C) (06/30 1000) Temp Source: Core (Comment) (06/30 0800) BP: 109/55 mmHg (06/30 0812) Pulse Rate: 89 (06/30 0812) Intake/Output from previous day: 06/29 0701 - 06/30 0700 In: 7068.1 [I.V.:2934.6; Blood:2443.5; NG/GT:120; IV Piggyback:1520] Out: 65 [Urine:55; Emesis/NG output:10] Intake/Output from this shift: Total I/O In: 582.4 [P.O.:88; I.V.:294.4; IV Piggyback:200] Out: 320 [Other:320]  Labs:  Recent Labs  04/18/15 0222 04/18/15 1110 04/18/15 2020 04/19/15 0456 04/19/15 0500  WBC 32.3*  --  22.5*  --  25.2*  HGB 5.5* 5.7* 7.6*  --  8.1*  PLT 112*  --  185  167  --  176  CREATININE 1.09* 0.61 0.61 0.39*  --    Estimated Creatinine Clearance: 110.7 mL/min (by C-G formula based on Cr of 0.39).  Recent Labs  04/19/15 0456  VANCOTROUGH 7*     Microbiology: Recent Results (from the past 720 hour(s))  Culture, blood (single)     Status: None   Collection Time: 04/03/15  3:26 PM  Result Value Ref Range Status   Organism ID, Bacteria NO GROWTH 5 DAYS  Final  MRSA PCR Screening     Status: None   Collection Time: 04/17/15  8:36 PM  Result Value Ref Range Status   MRSA by PCR NEGATIVE NEGATIVE Final    Comment:        The GeneXpert MRSA Assay (FDA approved for NASAL specimens only), is one component of a comprehensive MRSA colonization surveillance program. It is not intended to diagnose MRSA infection nor to guide or monitor  treatment for MRSA infections.   Culture, blood (routine x 2)     Status: None (Preliminary result)   Collection Time: 04/17/15 10:37 PM  Result Value Ref Range Status   Specimen Description BLOOD BLOOD LEFT FOREARM  Final   Special Requests   Final    BOTTLES DRAWN AEROBIC ONLY 6CC Performed at [facility]    Culture PENDING  Incomplete   Report Status PENDING  Incomplete  Culture, respiratory (NON-Expectorated)     Status: None (Preliminary result)   Collection Time: 04/18/15 12:02 AM  Result Value Ref Range Status   Specimen Description TRACHEAL ASPIRATE  Final   Special Requests Normal  Final   Gram Stain PENDING  Incomplete   Culture   Final    Non-Pathogenic Oropharyngeal-type Flora Isolated. Performed at [facility]    Report Status PENDING  Incomplete    Anti-infectives    Start     Dose/Rate Route Frequency Ordered Stop   04/19/15 0815  vancomycin (VANCOCIN) IVPB 1000 mg/200 mL premix     1,000 mg 200 mL/hr over 60 Minutes Intravenous  Once 04/19/15 0802 04/19/15 1011   04/18/15 1200  aztreonam (AZACTAM) 2 g in dextrose 5 % 50 mL IVPB     2 g 100 mL/hr over 30 Minutes Intravenous Every 12 hours 04/18/15 0659     04/18/15 0800  vancomycin (VANCOCIN) IVPB 1000 mg/200  mL premix  Status:  Discontinued     1,000 mg 200 mL/hr over 60 Minutes Intravenous Every 24 hours 04/18/15 0658 04/18/15 0914   04/18/15 0600  vancomycin (VANCOCIN) IVPB 750 mg/150 ml premix  Status:  Discontinued     750 mg 150 mL/hr over 60 Minutes Intravenous Every 12 hours 04/18/15 0558 04/18/15 0655   04/17/15 2315  aztreonam (AZACTAM) 2 g in dextrose 5 % 50 mL IVPB  Status:  Discontinued     2 g 100 mL/hr over 30 Minutes Intravenous 3 times per day 04/17/15 2313 04/18/15 0658   04/17/15 2315  vancomycin (VANCOCIN) IVPB 1000 mg/200 mL premix  Status:  Discontinued     1,000 mg 200 mL/hr over 60 Minutes Intravenous 3 times per day 04/17/15 2313 04/18/15 0557       Assessment: 29 year old African-American female, who is known history of sickle cell SS disease, presented with worsening anemia and pain crisis.   She was found to have liver cirrhosis on CT, and underwent liver biopsy 6/28. She developed hemorrhagic shock and hemoperitoneum after biopsy with subsequent hepatic artery embolization by IR.  Also developed acute renal failure with metabolic acidosis and hypercalcemia; intubated and started on CRRT overnight.  Vanc/aztreonam started per pharmacy to r/o HCAP; changing to vanc/Primaxin today with bleeding into peritoneal cavity and liver  04/18/2015 >> vanc >> 04/18/2015 >> aztreo >> 6/30 6/30 >> Primaxin >>   Temp: hypothermic WBC: elevated, stable Renal: CRRT; not recording effluent rate PCT increasing LA peaked 6/29 at 10, now trending down  6/28 blood: NGTD 6/29 trach asp: normal flora (not final) 6/29 urine (cath): IP MRSA neg  Dose changes/drug level info:  Adjusted aztreo for CVVHDF; dosing vanc by levels 6/30 VRm = 7 drawn about 22 hr after 1000 mg x 1  Goal of Therapy:  Vancomycin trough level 15-20 mcg/ml  Eradication of infection Appropriate antibiotic dosing for indication and renal function  Plan:  Day 2 antibiotics  Vanc 1000 mg IV x 1; check random level in 12 hrs.  Primaxin 250 mg IV q6 hrs while on CRRT  Follow clinical course, renal function, culture results as available  Follow for de-escalation of antibiotics and LOT   Bernadene Personrew Demi Trieu, PharmD Pager: [phone] 04/19/2015, 11:00 AM

## 2015-04-19 NOTE — Progress Notes (Signed)
eLink Physician-Brief Progress Note Patient Name: Joan Martin DOB: 09/21/1986 MRN: [id number]   Date of Service  04/19/2015  HPI/Events of Note  Ca++ = 6.1 and Albumin = 1.9. Corrects to 7.78.  eICU Interventions  Will replete Ca++.     Intervention Category Intermediate Interventions: Electrolyte abnormality - evaluation and management  [name] 04/19/2015, 6:43 AM

## 2015-04-19 NOTE — Progress Notes (Signed)
Chaplain received a referral from on call Chaplain as well as nurse for patient and family. Chaplain provided support for the family. Chaplain escorted mother of patient to the [place] and prayed a prayer of comfort. Chaplain will continue to follow.   04/19/15 1200  Clinical Encounter Type  Visited With Patient and family together  Visit Type Follow-up;Spiritual support;Social support  Referral From Chaplain;Nurse

## 2015-04-19 NOTE — Progress Notes (Signed)
Nephrologist on call notified of patient pressor requirement, CVP 5 and not being able to keep patient even without titration up on Levophed. Nephrologist okay with not removing any fluid from patient. Will continue to monitor hemodynamics and reassess.

## 2015-04-19 NOTE — Progress Notes (Signed)
Went to assess pt and notice tube placement was 23cm at the lips and it has previously been documented at 21cm at the lips. Pt volumes were good, morning chest x-ray revealed 1.7cm above the carina as far as tube placement. Chest assessment reveal symmetrical chest rise. RN aware, and DR. Sood aware of this finding. Per MD to leave the tube.

## 2015-04-19 NOTE — Progress Notes (Signed)
eLink Physician-Brief Progress Note Patient Name: Joan Martin DOB: 12/28/1985 MRN: [id number]   Date of Service  04/19/2015  HPI/Events of Note   Recent Labs Lab 04/18/15 0222 04/18/15 1110 04/18/15 2020 04/19/15 0456 04/19/15 2150  NA 138 141 141 139 136  K 7.2* 4.7 4.3 4.0 4.3  CL 114* 108 107 106 105  CO2 9* 15* 14* 16* 13*  GLUCOSE 96 102* 74 94 110*  BUN 21* 20 20 17 12   CREATININE 1.09* 0.61 0.61 0.39* 0.47  CALCIUM 7.5* 7.1* 6.4* 6.1* 5.6*  MG 2.2  --   --  2.1  --   PHOS 10.0*  --   --  3.6  3.5 2.9    Recent Labs Lab 04/18/15 0222 04/18/15 2020 04/19/15 0456  LATICACIDVEN 8.0* 10.4* 7.8*  PROCALCITON 2.74  --  6.75     eICU Interventions  Hypocalcemia  Plan repelte calcium gluconate x 1     Intervention Category Intermediate Interventions: Best-practice therapies (e.g. DVT, beta blocker, etc.)  Zan Orlick 04/19/2015, 10:48 PM

## 2015-04-19 NOTE — Progress Notes (Addendum)
Pt is currently on Vancomycin for r/o HCAP. Pt is on CRRT. Vancomycin 1000mg  IV x1 given 6/29@0730 . Vanco level = 7 mcg/ml 22 hours after 1 dose was given. Vancomycin 1gm IV x 1 given today @0911 . Will go ahead and put the patient on maintenance dose of Vanco 1gm IV Q24h and check Vanco trough level at steady state prior to 4th dose. No interruption in CRRT noted.  Thanks, Dorethea ClanFrens, Zniya Cottone Ann, PharmD 04/19/2015

## 2015-04-19 NOTE — Progress Notes (Signed)
CRITICAL VALUE ALERT  Critical value received: Calcium 6.4 Lactic Acid 10  Date of notification:  04/18/15  Time of notification:  2110  Critical value read back:Yes.    Nurse who received alert:  Josephina ShihShanda Skie Vitrano  MD notified (1st page):  Dr. Marchelle Gearingamaswamy  Time of first page:  2115  See orders and note from physician.

## 2015-04-19 NOTE — Progress Notes (Signed)
CRITICAL VALUE ALERT  Critical value received:  Calcium 5.6 Date of notification:  04/19/2015  Time of notification:  2234  Critical value read back:Yes.    Nurse who received alert:  Bosie HelperS Lyndia Bury, RN   MD notified (1st page):  Marchelle Gearingamaswamy ([facility], [place])  Time of first page:  2242  MD notified (2nd page):  Time of second page:  Responding MD:  Marchelle Gearingamaswamy  Time MD responded:  2240

## 2015-04-19 NOTE — Progress Notes (Signed)
[facility] Progress Note   Subjective: had 2nd embolization procedure and Hb holding now ~ 8. Lipase up >3000. AST/ ALT rising , wbc down, plts stable/ normal, Total and direct bilit up slightly today to 35/23.  up . Last INR 2.0 and last PTT up at 68  Filed Vitals:   04/19/15 0700 04/19/15 0715 04/19/15 0730 04/19/15 0812  BP:    109/55  Pulse:    89  Temp: 95 F (35 C) 95 F (35 C) 95.2 F (35.1 C)   TempSrc:      Resp: _0 Height:      Weight:      SpO2: 100% 100% 95% 97%   Exam: On vent, sedated, poorly responsive +jvd ETT in place and OGT Chest clear ant / lat bilat RRR tachy no rub or gallop Abd tense, distended, firm, dec'd BS GU foley cath minimal output LE's 2+ diffuse edema Neuro is sedated on the vent      Assessment: 1. AKI / ATN from hemorrhagic shock - on CRRT D#2 2. Shock - on pressors, s/p embolization procedure x 2 after liver bx 3. Cirrhosis s/p bx results pending 4. Met acidosis - resolved 5. Anemia - stabilizing 6. Volume - cvp is normal now, weights are up ~10-20kg. CXR clear, on 30% FiO2  Plan - cont CRRT, keep even or slightly +    Kelly Splinter MD  pager [phone]    cell [phone]  04/19/2015, 8:41 AM     Recent Labs Lab 04/18/15 0222 04/18/15 1110 04/18/15 2020 04/19/15 0456  NA 138 141 141 139  K 7.2* 4.7 4.3 4.0  CL 114* 108 107 106  CO2 9* 15* 14* 16*  GLUCOSE 96 102* 74 94  BUN 21* _1 CREATININE 1.09* 0.61 0.61 0.39*  CALCIUM 7.5* 7.1* 6.4* 6.1*  PHOS 10.0*  --   --  3.6  3.5    Recent Labs Lab 04/18/15 0222 04/18/15 2020 04/19/15 0456  AST 948* 6302* 9238*  ALT 183* 981* 1331*  ALKPHOS 345* 291* 354*  BILITOT 25.5* 33.7* 35.4*  PROT 4.6* 4.9* 5.0*  ALBUMIN 1.2* 2.0* 1.9*  1.9*    Recent Labs Lab 04/17/15 0353  04/18/15 0222 04/18/15 1110 04/18/15 2020 04/19/15 0500  WBC 24.1*  < > 32.3*  --  22.5* 25.2*  NEUTROABS 12.3*  --  22.7*  --  16.1*  --   HGB 6.9*  < > 5.5*  5.7* 7.6* 8.1*  HCT 19.7*  < > 16.7* 17.0* 22.4* 22.9*  MCV 96.6  < > 96.0  --  85.2 83.6  PLT 152  < > 112*  --  185  167 176  < > = values in this interval not displayed. . sodium chloride   Intravenous Once  . antiseptic oral rinse  7 mL Mouth Rinse QID  . arformoterol  15 mcg Nebulization BID  . artificial tears   Both Eyes 3 times per day  . aztreonam  2 g Intravenous Q12H  . budesonide (PULMICORT) nebulizer solution  0.25 mg Nebulization BID  . calcium gluconate IVPB  4 g Intravenous Once  . chlorhexidine  15 mL Mouth Rinse BID  . fentaNYL (SUBLIMAZE) injection  100 mcg Intravenous Once  . folic acid  1 mg Intravenous Daily  . heparin  1,000 Units Intracatheter Once  . lactulose  30 g Per Tube TID  . pantoprazole (PROTONIX) IV  40 mg Intravenous Q24H  .  vancomycin  1,000 mg Intravenous Once  . Vitamin D (Ergocalciferol)  50,000 Units Oral Q Sun   . citrate dextrose    . dextrose 50 mL/hr at 04/19/15 0700  . fentaNYL infusion INTRAVENOUS 200 mcg/hr (04/19/15 0700)  . norepinephrine (LEVOPHED) Adult infusion 15 mcg/min (04/19/15 0820)  . dialysis replacement fluid (prismasate) 400 mL/hr at 04/18/15 1835  . dialysis replacement fluid (prismasate) 400 mL/hr at 04/19/15 0811  . dialysate (PRISMASATE) 1,800 mL/hr at 04/19/15 0812   albuterol, diphenhydrAMINE, fentaNYL, heparin, midazolam, promethazine, sodium chloride

## 2015-04-19 NOTE — Progress Notes (Signed)
PULMONARY / CRITICAL CARE MEDICINE   Name: Joan Martin MRN: [id number] DOB: 10/14/1986    ADMISSION DATE:  2015-09-29 CONSULTATION DATE:  04/17/2015  REFERRING MD :  Dr. Ashley RoyaltyMatthews  CHIEF COMPLAINT: sickle cell crisis  INITIAL PRESENTATION:  29 yo female admitted with sickle cell crisis.  Found to have liver mass.  Had Bx of on 6/28 >> developed hemoperitoneum, hemorrhagic shock, AKI, VDRF post-procedure.  STUDIES:  6/22 US abdomen >> cirrhosis, 9.6 cm mass Rt lobe of liver, ascites 6/23 MRI abdomen >> cirrhosis, macrolobulated portion of liver in Rt lobe 6/28 CT abd/pelvis >> hemoperitoneum  SIGNIFICANT EVENTS: 6/20  Admitted for sickle cell crisis 6/28  Liver biopsy >> hemoperitoneum; Embolization of Lt hepatic artery 6/29  Start CRRT 6/29  Transfused 4 units PRBC, 4 FFP, 2 Platelets.  To IR for repeat embolization of L hepatic artery    SUBJECTIVE: RN reports pt on 15 mcg of levophed, temp improved to 95.2, improved acidosis, LFT's & lipase rising   VITAL SIGNS: Temp:  [90.3 F (32.4 C)-97.2 F (36.2 C)] 95.2 F (35.1 C) (06/30 0730) Pulse Rate:  [65-89] 89 (06/30 0812) Resp:  [0-30] 30 (06/30 0812) BP: (89-114)/(47-80) 109/55 mmHg (06/30 0812) SpO2:  [93 %-100 %] 97 % (06/30 0812) Arterial Line BP: (88-140)/(44-72) 100/52 mmHg (06/30 0730) FiO2 (%):  [30 %-40 %] 30 % (06/30 0812) Weight:  [172 lb 2.9 oz (78.1 kg)] 172 lb 2.9 oz (78.1 kg) (06/30 0500)   HEMODYNAMICS: CVP:  [5 mmHg-16 mmHg] 11 mmHg   VENTILATOR SETTINGS: Vent Mode:  [-] PRVC FiO2 (%):  [30 %-40 %] 30 % Set Rate:  [28 bmp] 28 bmp Vt Set:  [490 mL] 490 mL PEEP:  [5 cmH20] 5 cmH20 Plateau Pressure:  [23 cmH20-28 cmH20] 24 cmH20   INTAKE / OUTPUT:  Intake/Output Summary (Last 24 hours) at 04/19/15 0817 Last data filed at 04/19/15 0800  Gross per 24 hour  Intake 6377.97 ml  Output     65 ml  Net 6312.97 ml    PHYSICAL EXAMINATION: General: critically ill appearing adult female on  vent Neuro: RASS -2, opens eyes and nods occasionally  HEENT: ETT in place, sclera icterus  Cardiovascular: regular, no murmur Lungs: even/non-labored, lungs clear bilaterally  Abdomen: firm, distended, absent bowel sounds Musculoskeletal: generalized 1+ edema Skin: no rashes.  Jaundice   LABS:  CBC  Recent Labs Lab 04/18/15 0222 04/18/15 1110 04/18/15 2020 04/19/15 0500  WBC 32.3*  --  22.5* 25.2*  HGB 5.5* 5.7* 7.6* 8.1*  HCT 16.7* 17.0* 22.4* 22.9*  PLT 112*  --  185  167 176   Coag's  Recent Labs Lab 04/12/15 1300  04/18/15 1110 04/18/15 2020 04/19/15 0456  APTT 47*  --  78* 68*  --   INR 1.28  < > 1.90* 1.86*  1.88* 2.01*  < > = values in this interval not displayed.   BMET  Recent Labs Lab 04/18/15 1110 04/18/15 2020 04/19/15 0456  NA 141 141 139  K 4.7 4.3 4.0  CL 108 107 106  CO2 15* 14* 16*  BUN 20 20 17   CREATININE 0.61 0.61 0.39*  GLUCOSE 102* 74 94   Electrolytes  Recent Labs Lab 04/18/15 0222 04/18/15 1110 04/18/15 2020 04/19/15 0456  CALCIUM 7.5* 7.1* 6.4* 6.1*  MG 2.2  --   --  2.1  PHOS 10.0*  --   --  3.6  3.5   Sepsis Markers  Recent Labs Lab 04/18/15 0222 04/18/15 2020  04/19/15 0456  LATICACIDVEN 8.0* 10.4* 7.8*  PROCALCITON 2.74  --  6.75   ABG  Recent Labs Lab 04/18/15 1153 04/18/15 1600 04/19/15 0444  PHART 7.410 7.295* 7.372  PCO2ART 20.1* 22.3* 27.1*  PO2ART 105* 74.6* 77.7*   Liver Enzymes  Recent Labs Lab 04/18/15 0222 04/18/15 2020 04/19/15 0456  AST 948* 6302* 9238*  ALT 183* 981* 1331*  ALKPHOS 345* 291* 354*  BILITOT 25.5* 33.7* 35.4*  ALBUMIN 1.2* 2.0* 1.9*  1.9*   Glucose  Recent Labs Lab 04/18/15 1706 04/18/15 2035 04/18/15 2229 04/18/15 2346 04/19/15 0345 04/19/15 0738  GLUCAP 99 70 65 116* 82 77    Imaging Ir Angiogram Visceral Selective  04/18/2015   CLINICAL DATA:  29 year old female with a history of recent percutaneous liver biopsy for concern of cirrhosis  diagnosis.  The patient subsequently developed intra abdominal hemorrhage and underwent embolization of left hepatic artery branches on the same day. Given that the patient is coagulopathic and is hypothermic, it seems as though she has continued to bleed through the particle embolization, with low hemoglobin and downward trend of her blood pressure.  She presents for mesenteric angiogram and potential further embolization.  EXAM: MESENTERIC ANGIOGRAM, WITH ANGIOGRAM OF THE CELIAC ARTERY, LEFT GASTRIC ARTERY, LEFT HEPATIC ARTERY, AND SEGMENTAL BRANCHES OF THE LEFT HEPATIC ARTERY.  COIL EMBOLIZATION OF INFERIOR SEGMENT LEFT HEPATIC ARTERY.  ANESTHESIA/SEDATION: The patient is intubated, with management from respiratory therapy team.  2.0 Mg IV Versed; fentanyl infusion  Contrast Volume: 120 cc Omni 300  Additional Medications: 5 under mcg nitroglycerin, 2.5 mg intra-arterial verapamil  FLUOROSCOPY TIME:  29 minutes, 12 seconds Minutes.  PROCEDURE: The procedure, risks, benefits, and alternatives were explained to the patient's family. Questions regarding the procedure were encouraged and answered. The patient understands and consents to the procedure.  The indwelling right common femoral artery sheath was prepped and draped in the usual sterile fashion, as well as the right groin, which was prepped with Betadine in a sterile fashion, and a sterile drape was applied covering the operative field.  Wire was advanced through the indwelling sheath, which was then exchanged over the wire for a 35 cm 5 French sheath. This was attached to a pressure bag with heparinized saline.  A 5 [place] Mickelson catheter was then used to select the celiac artery and a celiac artery angiogram was performed with power injection.  The Mickelson catheter and a Glidewire were used to select the left gastric artery, with the [facility] the catheter advanced over the Glidewire into the proximal left gastric artery.  Left gastric artery angiogram  was performed.  100 mcg of nitroglycerin were then infused intra arterial.  A renegade STC catheter was then advanced over a 014 fathom wire through the left gastric artery to select the the replaced left hepatic artery, with sub selection of the inferior segments of the left hepatic artery, which were visualized to contribute to ongoing contrast pooling.  Sub selection of distal branches was performed with coil embolization with 2 mm and 3 mm diameter interlock coils.  Repeat angiogram of the segments demonstrated on going filling of the target vasculature, and further coil embolization of inferior segment of the left hepatic artery was required.  Gel-Foam slurry was infused after coil embolization of the downgoing/inferior segment.  Repeat angiogram demonstrated no further filling of the target vasculature.  No complications were encountered.  No significant blood loss was encountered during the procedure.  Patient remained hemodynamically stable through the procedure.  COMPLICATIONS: None.  FINDINGS: Angiogram of the celiac artery demonstrates variant anatomy of the hepatic vasculature, seen on prior study. Replaced left hepatic artery from left gastric artery again visualized, which was selected.  Angiogram demonstrates vessel spasm with attenuation of multiple distal branches, compatible with the patient's current hypovolemic shock.  Selected angiography of segments of the left hepatic artery, segment 4 branch, demonstrate ongoing contrast pooling in the similar location to the comparison study.  After coil embolization of the targeted vasculature, there is no further filling on contrast injection.  IMPRESSION: Status post mesenteric angiogram with sub selection of replaced left hepatic artery via the left gastric artery, with coil embolization of segment 4 branches of the left hepatic artery, which occluded flow to the target vasculature. No further flow to the targeted vessels identified on the completion  angiogram.  Signed,  Yvone Neu. Loreta Ave, DO  [facility]  [facility] Radiology   Electronically Signed   By: Gilmer Mor D.O.   On: 04/18/2015 17:22   Ir Angiogram Selective Each Additional Vessel  04/18/2015   CLINICAL DATA:  29 year old female with a history of recent percutaneous liver biopsy for concern of cirrhosis diagnosis.  The patient subsequently developed intra abdominal hemorrhage and underwent embolization of left hepatic artery branches on the same day. Given that the patient is coagulopathic and is hypothermic, it seems as though she has continued to bleed through the particle embolization, with low hemoglobin and downward trend of her blood pressure.  She presents for mesenteric angiogram and potential further embolization.  EXAM: MESENTERIC ANGIOGRAM, WITH ANGIOGRAM OF THE CELIAC ARTERY, LEFT GASTRIC ARTERY, LEFT HEPATIC ARTERY, AND SEGMENTAL BRANCHES OF THE LEFT HEPATIC ARTERY.  COIL EMBOLIZATION OF INFERIOR SEGMENT LEFT HEPATIC ARTERY.  ANESTHESIA/SEDATION: The patient is intubated, with management from respiratory therapy team.  2.0 Mg IV Versed; fentanyl infusion  Contrast Volume: 120 cc Omni 300  Additional Medications: 5 under mcg nitroglycerin, 2.5 mg intra-arterial verapamil  FLUOROSCOPY TIME:  29 minutes, 12 seconds Minutes.  PROCEDURE: The procedure, risks, benefits, and alternatives were explained to the patient's family. Questions regarding the procedure were encouraged and answered. The patient understands and consents to the procedure.  The indwelling right common femoral artery sheath was prepped and draped in the usual sterile fashion, as well as the right groin, which was prepped with Betadine in a sterile fashion, and a sterile drape was applied covering the operative field.  Wire was advanced through the indwelling sheath, which was then exchanged over the wire for a 35 cm 5 French sheath. This was attached to a pressure bag with heparinized  saline.  A 5 [place] Mickelson catheter was then used to select the celiac artery and a celiac artery angiogram was performed with power injection.  The Mickelson catheter and a Glidewire were used to select the left gastric artery, with the [facility] the catheter advanced over the Glidewire into the proximal left gastric artery.  Left gastric artery angiogram was performed.  100 mcg of nitroglycerin were then infused intra arterial.  A renegade STC catheter was then advanced over a 014 fathom wire through the left gastric artery to select the the replaced left hepatic artery, with sub selection of the inferior segments of the left hepatic artery, which were visualized to contribute to ongoing contrast pooling.  Sub selection of distal branches was performed with coil embolization with 2 mm and 3 mm diameter interlock coils.  Repeat angiogram of the segments demonstrated on going filling of  the target vasculature, and further coil embolization of inferior segment of the left hepatic artery was required.  Gel-Foam slurry was infused after coil embolization of the downgoing/inferior segment.  Repeat angiogram demonstrated no further filling of the target vasculature.  No complications were encountered.  No significant blood loss was encountered during the procedure.  Patient remained hemodynamically stable through the procedure.  COMPLICATIONS: None.  FINDINGS: Angiogram of the celiac artery demonstrates variant anatomy of the hepatic vasculature, seen on prior study. Replaced left hepatic artery from left gastric artery again visualized, which was selected.  Angiogram demonstrates vessel spasm with attenuation of multiple distal branches, compatible with the patient's current hypovolemic shock.  Selected angiography of segments of the left hepatic artery, segment 4 branch, demonstrate ongoing contrast pooling in the similar location to the comparison study.  After coil embolization of the targeted vasculature, there  is no further filling on contrast injection.  IMPRESSION: Status post mesenteric angiogram with sub selection of replaced left hepatic artery via the left gastric artery, with coil embolization of segment 4 branches of the left hepatic artery, which occluded flow to the target vasculature. No further flow to the targeted vessels identified on the completion angiogram.  Signed,  Yvone Neu. Loreta Ave, DO  [facility]  [facility] Radiology   Electronically Signed   By: Gilmer Mor D.O.   On: 04/18/2015 17:22   Ir Angiogram Selective Each Additional Vessel  04/18/2015   CLINICAL DATA:  29 year old female with a history of recent percutaneous liver biopsy for concern of cirrhosis diagnosis.  The patient subsequently developed intra abdominal hemorrhage and underwent embolization of left hepatic artery branches on the same day. Given that the patient is coagulopathic and is hypothermic, it seems as though she has continued to bleed through the particle embolization, with low hemoglobin and downward trend of her blood pressure.  She presents for mesenteric angiogram and potential further embolization.  EXAM: MESENTERIC ANGIOGRAM, WITH ANGIOGRAM OF THE CELIAC ARTERY, LEFT GASTRIC ARTERY, LEFT HEPATIC ARTERY, AND SEGMENTAL BRANCHES OF THE LEFT HEPATIC ARTERY.  COIL EMBOLIZATION OF INFERIOR SEGMENT LEFT HEPATIC ARTERY.  ANESTHESIA/SEDATION: The patient is intubated, with management from respiratory therapy team.  2.0 Mg IV Versed; fentanyl infusion  Contrast Volume: 120 cc Omni 300  Additional Medications: 5 under mcg nitroglycerin, 2.5 mg intra-arterial verapamil  FLUOROSCOPY TIME:  29 minutes, 12 seconds Minutes.  PROCEDURE: The procedure, risks, benefits, and alternatives were explained to the patient's family. Questions regarding the procedure were encouraged and answered. The patient understands and consents to the procedure.  The indwelling right common femoral artery sheath was  prepped and draped in the usual sterile fashion, as well as the right groin, which was prepped with Betadine in a sterile fashion, and a sterile drape was applied covering the operative field.  Wire was advanced through the indwelling sheath, which was then exchanged over the wire for a 35 cm 5 French sheath. This was attached to a pressure bag with heparinized saline.  A 5 [place] Mickelson catheter was then used to select the celiac artery and a celiac artery angiogram was performed with power injection.  The Mickelson catheter and a Glidewire were used to select the left gastric artery, with the [facility] the catheter advanced over the Glidewire into the proximal left gastric artery.  Left gastric artery angiogram was performed.  100 mcg of nitroglycerin were then infused intra arterial.  A renegade STC catheter was then advanced over a 014 fathom wire through  the left gastric artery to select the the replaced left hepatic artery, with sub selection of the inferior segments of the left hepatic artery, which were visualized to contribute to ongoing contrast pooling.  Sub selection of distal branches was performed with coil embolization with 2 mm and 3 mm diameter interlock coils.  Repeat angiogram of the segments demonstrated on going filling of the target vasculature, and further coil embolization of inferior segment of the left hepatic artery was required.  Gel-Foam slurry was infused after coil embolization of the downgoing/inferior segment.  Repeat angiogram demonstrated no further filling of the target vasculature.  No complications were encountered.  No significant blood loss was encountered during the procedure.  Patient remained hemodynamically stable through the procedure.  COMPLICATIONS: None.  FINDINGS: Angiogram of the celiac artery demonstrates variant anatomy of the hepatic vasculature, seen on prior study. Replaced left hepatic artery from left gastric artery again visualized, which was selected.   Angiogram demonstrates vessel spasm with attenuation of multiple distal branches, compatible with the patient's current hypovolemic shock.  Selected angiography of segments of the left hepatic artery, segment 4 branch, demonstrate ongoing contrast pooling in the similar location to the comparison study.  After coil embolization of the targeted vasculature, there is no further filling on contrast injection.  IMPRESSION: Status post mesenteric angiogram with sub selection of replaced left hepatic artery via the left gastric artery, with coil embolization of segment 4 branches of the left hepatic artery, which occluded flow to the target vasculature. No further flow to the targeted vessels identified on the completion angiogram.  Signed,  Yvone Neu. Loreta Ave, DO  [facility]  [facility] Radiology   Electronically Signed   By: Gilmer Mor D.O.   On: 04/18/2015 17:22   Dg Chest Port 1 View  04/19/2015   CLINICAL DATA:  Respiratory failure.  Endotracheal intubation.  EXAM: PORTABLE CHEST - 1 VIEW  COMPARISON:  04/17/2015  FINDINGS: The endotracheal tube is 1.7 cm above the carina. The nasogastric tube extends into the stomach. The right jugular central line extends into the right atrium. There is no pneumothorax. There is no large effusion. There is unchanged cardiomegaly. There is no confluent airspace consolidation.  IMPRESSION: Support equipment appears satisfactorily positioned. The lungs are grossly clear.   Electronically Signed   By: Ellery Plunk M.D.   On: 04/19/2015 06:32   Ir Embo Arterial Not Hemorr [employer] Guide Roadmapping  04/18/2015   CLINICAL DATA:  29 year old female with a history of recent percutaneous liver biopsy for concern of cirrhosis diagnosis.  The patient subsequently developed intra abdominal hemorrhage and underwent embolization of left hepatic artery branches on the same day. Given that the patient is coagulopathic and is hypothermic, it seems  as though she has continued to bleed through the particle embolization, with low hemoglobin and downward trend of her blood pressure.  She presents for mesenteric angiogram and potential further embolization.  EXAM: MESENTERIC ANGIOGRAM, WITH ANGIOGRAM OF THE CELIAC ARTERY, LEFT GASTRIC ARTERY, LEFT HEPATIC ARTERY, AND SEGMENTAL BRANCHES OF THE LEFT HEPATIC ARTERY.  COIL EMBOLIZATION OF INFERIOR SEGMENT LEFT HEPATIC ARTERY.  ANESTHESIA/SEDATION: The patient is intubated, with management from respiratory therapy team.  2.0 Mg IV Versed; fentanyl infusion  Contrast Volume: 120 cc Omni 300  Additional Medications: 5 under mcg nitroglycerin, 2.5 mg intra-arterial verapamil  FLUOROSCOPY TIME:  29 minutes, 12 seconds Minutes.  PROCEDURE: The procedure, risks, benefits, and alternatives were explained to the patient's family. Questions regarding the  procedure were encouraged and answered. The patient understands and consents to the procedure.  The indwelling right common femoral artery sheath was prepped and draped in the usual sterile fashion, as well as the right groin, which was prepped with Betadine in a sterile fashion, and a sterile drape was applied covering the operative field.  Wire was advanced through the indwelling sheath, which was then exchanged over the wire for a 35 cm 5 French sheath. This was attached to a pressure bag with heparinized saline.  A 5 [place] Mickelson catheter was then used to select the celiac artery and a celiac artery angiogram was performed with power injection.  The Mickelson catheter and a Glidewire were used to select the left gastric artery, with the [facility] the catheter advanced over the Glidewire into the proximal left gastric artery.  Left gastric artery angiogram was performed.  100 mcg of nitroglycerin were then infused intra arterial.  A renegade STC catheter was then advanced over a 014 fathom wire through the left gastric artery to select the the replaced left hepatic  artery, with sub selection of the inferior segments of the left hepatic artery, which were visualized to contribute to ongoing contrast pooling.  Sub selection of distal branches was performed with coil embolization with 2 mm and 3 mm diameter interlock coils.  Repeat angiogram of the segments demonstrated on going filling of the target vasculature, and further coil embolization of inferior segment of the left hepatic artery was required.  Gel-Foam slurry was infused after coil embolization of the downgoing/inferior segment.  Repeat angiogram demonstrated no further filling of the target vasculature.  No complications were encountered.  No significant blood loss was encountered during the procedure.  Patient remained hemodynamically stable through the procedure.  COMPLICATIONS: None.  FINDINGS: Angiogram of the celiac artery demonstrates variant anatomy of the hepatic vasculature, seen on prior study. Replaced left hepatic artery from left gastric artery again visualized, which was selected.  Angiogram demonstrates vessel spasm with attenuation of multiple distal branches, compatible with the patient's current hypovolemic shock.  Selected angiography of segments of the left hepatic artery, segment 4 branch, demonstrate ongoing contrast pooling in the similar location to the comparison study.  After coil embolization of the targeted vasculature, there is no further filling on contrast injection.  IMPRESSION: Status post mesenteric angiogram with sub selection of replaced left hepatic artery via the left gastric artery, with coil embolization of segment 4 branches of the left hepatic artery, which occluded flow to the target vasculature. No further flow to the targeted vessels identified on the completion angiogram.  Signed,  Yvone Neu. Loreta Ave, DO  [facility]  [facility] Radiology   Electronically Signed   By: Gilmer Mor D.O.   On: 04/18/2015 17:22     ASSESSMENT /  PLAN:  PULMONARY ETT 6/28 > A:  Acute respiratory failure 2nd to hemorrhagic shock, encephalopathy, AKI, metabolic acidosis. Respiratory Alkalosis  Hx of asthma. P:   MV support, 8cc/kg  F/u CXR, ABG Use brovana/pulmicort in place of advair for now PRN albuterol  CARDIOVASCULAR Rt IJ CVL 6/28 >> Lt femoral HD cath 6/28 >> A line 6/28 >> A:  Hemorrhagic shock 2nd to hemoperitoneum. P:  Continue IV fluids, blood product resuscitation  Goal CVP > 8 Pressors as needed to keep MAP > 65  RENAL A:   AKI 2nd to hemorrhagic shock >> baseline creatine 0.76 from 04/11/2015. Metabolic acidosis 2nd to lactic acidosis and renal failure. Rhabdomyolysis  Hyperkalemia.  P:   CRRT initiated 6/29 per Renal F/u BMET, ABG Avoid nephrotoxic agents   GASTROINTESTINAL A:   Cirrhosis of uncertain etiology >> s/p Lt hepatic lobe liver bx 6/28. Abdominal distention. Elevated Lipase  P:   F/u liver bx results Protonix for SUP Monitor bladder pressure for abdominal compartment syndrome F/u LFT's, LDH Avoid acetaminophen in setting of liver disease Consider follow up CT ABD to assess pancreas / hemoperitoneum   HEMATOLOGIC A:   Sickle cell crisis. Hemorrhagic shock 2nd to hemoperitoneum with consumption coagulopathy. P:  F/u CBC, coag's No indication for exchange transfusion at present Transfuse as needed for bleeding  INFECTIOUS A:   Shock >> most likely hemorrhagic; less concern for sepsis, but procalcitonin mildly elevated 6/29. P:   Day 3 vancomycin, aztreonam  BCx2 6/28 >> UC 6/28 >> Sputum 6/28 >>  ENDOCRINE A:   Hypoglycemia. P:   D5NS at 40 with hypoglycemia  CBG q4h   NEUROLOGIC A:    Acute metabolic encephalopathy with elevate ammonia level 6/28. Chronic headaches. P:   RASS goal -1 lactulose enema TID Hold topamax for now   Canary Brim, NP-C [facility] Pgr: [phone] or if no answer [phone] 04/19/2015, 8:17 AM   Reviewed  above, examined.  Pressors needs evening out.  She is maintaining hb >> hopefully bleeding has stopped.    She wakes up and follows commands.  Heart sounds regular, lungs clear.  Abd distended, decreased tenderness, no bowel sounds, 2+ edema.  Labs, CXR from 6/30 reviewed.  Continue pressors to keep MAP > 65.  Full vent support until medical status more stable.  Continue CRRT per renal.  Monitor blood sugars.  F/u LFT's, Lipase and monitor abdominal exam >> hopefully she has not had significant injury to her bowel or pancreas from hypoperfusion and ischemia.  Defer starting tube feeds for now >> consider trickle feeds 7/01 if she stablizes.  Goal Hb> 8.  Transfuse FFP and PLT as needed.  Updated pt's father at bedside.  CC time by me independent of APP time is 35 minutes.  Coralyn Helling, MD [facility] Pulmonary/Critical Care 04/19/2015, 9:43 AM Pager:  [phone] After 3pm call: [phone]

## 2015-04-20 ENCOUNTER — Inpatient Hospital Stay (HOSPITAL_COMMUNITY): Payer: Medicaid Other

## 2015-04-20 DIAGNOSIS — R188 Other ascites: Secondary | ICD-10-CM

## 2015-04-20 DIAGNOSIS — J9600 Acute respiratory failure, unspecified whether with hypoxia or hypercapnia: Secondary | ICD-10-CM | POA: Insufficient documentation

## 2015-04-20 LAB — PREPARE FRESH FROZEN PLASMA
Unit division: 0
Unit division: 0

## 2015-04-20 LAB — HEPATIC FUNCTION PANEL
ALBUMIN: 1.8 g/dL — AB (ref 3.5–5.0)
ALK PHOS: 395 U/L — AB (ref 38–126)
ALT: 1312 U/L — ABNORMAL HIGH (ref 14–54)
ALT: 1454 U/L — ABNORMAL HIGH (ref 14–54)
AST: 7289 U/L — AB (ref 15–41)
AST: 9527 U/L — ABNORMAL HIGH (ref 15–41)
Albumin: 1.9 g/dL — ABNORMAL LOW (ref 3.5–5.0)
Alkaline Phosphatase: 425 U/L — ABNORMAL HIGH (ref 38–126)
Bilirubin, Direct: 19.1 mg/dL — ABNORMAL HIGH (ref 0.1–0.5)
Bilirubin, Direct: 17.5 mg/dL — ABNORMAL HIGH (ref 0.1–0.5)
Indirect Bilirubin: 11.9 mg/dL — ABNORMAL HIGH (ref 0.3–0.9)
Indirect Bilirubin: 9.4 mg/dL — ABNORMAL HIGH (ref 0.3–0.9)
TOTAL PROTEIN: 4.5 g/dL — AB (ref 6.5–8.1)
Total Bilirubin: 31 mg/dL (ref 0.3–1.2)
Total Bilirubin: 26.9 mg/dL (ref 0.3–1.2)
Total Protein: 4.9 g/dL — ABNORMAL LOW (ref 6.5–8.1)

## 2015-04-20 LAB — LACTATE DEHYDROGENASE: LDH: >10000 U/L — ABNORMAL HIGH (ref 98–192)

## 2015-04-20 LAB — CBC
HCT: 19.8 % — ABNORMAL LOW (ref 36.0–46.0)
Hemoglobin: 6.7 g/dL — CL (ref 12.0–15.0)
MCH: 29.6 pg (ref 26.0–34.0)
MCHC: 33.8 g/dL (ref 30.0–36.0)
MCV: 87.6 fL (ref 78.0–100.0)
Platelets: 154 10*3/uL (ref 150–400)
RBC: 2.26 MIL/uL — AB (ref 3.87–5.11)
RDW: 20.9 % — AB (ref 11.5–15.5)
WBC: 35.7 10*3/uL — ABNORMAL HIGH (ref 4.0–10.5)

## 2015-04-20 LAB — PROTIME-INR
INR: 2.65 — AB (ref 0.00–1.49)
Prothrombin Time: 27.9 seconds — ABNORMAL HIGH (ref 11.6–15.2)

## 2015-04-20 LAB — RENAL FUNCTION PANEL
ANION GAP: 19 — AB (ref 5–15)
Albumin: 1.8 g/dL — ABNORMAL LOW (ref 3.5–5.0)
Albumin: 1.8 g/dL — ABNORMAL LOW (ref 3.5–5.0)
Anion gap: 20 — ABNORMAL HIGH (ref 5–15)
BUN: 10 mg/dL (ref 6–20)
BUN: 8 mg/dL (ref 6–20)
CALCIUM: 5.8 mg/dL — AB (ref 8.9–10.3)
CO2: 12 mmol/L — AB (ref 22–32)
CO2: 11 mmol/L — ABNORMAL LOW (ref 22–32)
CREATININE: 0.54 mg/dL (ref 0.44–1.00)
Calcium: 5.6 mg/dL — CL (ref 8.9–10.3)
Chloride: 103 mmol/L (ref 101–111)
Chloride: 100 mmol/L — ABNORMAL LOW (ref 101–111)
GFR calc non Af Amer: >60 mL/min (ref >60)
GLUCOSE: 97 mg/dL (ref 65–99)
Glucose, Bld: 57 mg/dL — ABNORMAL LOW (ref 65–99)
PHOSPHORUS: 3.6 mg/dL (ref 2.5–4.6)
Phosphorus: 3.6 mg/dL (ref 2.5–4.6)
Potassium: 5.3 mmol/L — ABNORMAL HIGH (ref 3.5–5.1)
Potassium: 4.8 mmol/L (ref 3.5–5.1)
SODIUM: 131 mmol/L — AB (ref 135–145)
Sodium: 134 mmol/L — ABNORMAL LOW (ref 135–145)

## 2015-04-20 LAB — CK: Total CK: 2236 U/L — ABNORMAL HIGH (ref 38–234)

## 2015-04-20 LAB — CULTURE, RESPIRATORY: GRAM STAIN: NONE SEEN

## 2015-04-20 LAB — GLUCOSE, CAPILLARY
GLUCOSE-CAPILLARY: 102 mg/dL — AB (ref 65–99)
GLUCOSE-CAPILLARY: 46 mg/dL — AB (ref 65–99)
GLUCOSE-CAPILLARY: 60 mg/dL — AB (ref 65–99)
GLUCOSE-CAPILLARY: 74 mg/dL (ref 65–99)
Glucose-Capillary: 62 mg/dL — ABNORMAL LOW (ref 65–99)
Glucose-Capillary: 81 mg/dL (ref 65–99)
Glucose-Capillary: 67 mg/dL (ref 65–99)
Glucose-Capillary: 94 mg/dL (ref 65–99)
Glucose-Capillary: 131 mg/dL — ABNORMAL HIGH (ref 65–99)
Glucose-Capillary: 154 mg/dL — ABNORMAL HIGH (ref 65–99)

## 2015-04-20 LAB — GRAM STAIN

## 2015-04-20 LAB — GLUCOSE, SEROUS FLUID: Glucose, Fluid: 74 mg/dL

## 2015-04-20 LAB — PROTEIN, BODY FLUID

## 2015-04-20 LAB — MAGNESIUM: Magnesium: 2.3 mg/dL (ref 1.7–2.4)

## 2015-04-20 LAB — HEMOGLOBIN AND HEMATOCRIT, BLOOD
HCT: 23 % — ABNORMAL LOW (ref 36.0–46.0)
HEMOGLOBIN: 7.4 g/dL — AB (ref 12.0–15.0)

## 2015-04-20 LAB — LIPASE, BLOOD: Lipase: 2354 U/L — ABNORMAL HIGH (ref 22–51)

## 2015-04-20 LAB — BODY FLUID CELL COUNT WITH DIFFERENTIAL
Lymphs, Fluid: 33 %
MONOCYTE-MACROPHAGE-SEROUS FLUID: 12 % — AB (ref 50–90)
Neutrophil Count, Fluid: 55 % — ABNORMAL HIGH (ref 0–25)
Total Nucleated Cell Count, Fluid: 3315 cu mm — ABNORMAL HIGH (ref 0–1000)

## 2015-04-20 LAB — LACTATE DEHYDROGENASE, PLEURAL OR PERITONEAL FLUID: LD, Fluid: 2473 U/L — ABNORMAL HIGH (ref 3–23)

## 2015-04-20 LAB — LACTIC ACID, PLASMA
LACTIC ACID, VENOUS: 12.1 mmol/L — AB (ref 0.5–2.0)
LACTIC ACID, VENOUS: 13.5 mmol/L — AB (ref 0.5–2.0)

## 2015-04-20 LAB — HEMATOCRIT, BODY FLUID: Hematocrit, Fluid: <2 %

## 2015-04-20 LAB — FIBRINOGEN: Fibrinogen: 163 mg/dL — ABNORMAL LOW (ref 204–475)

## 2015-04-20 LAB — URINE CULTURE
CULTURE: NO GROWTH
Special Requests: NORMAL

## 2015-04-20 LAB — CULTURE, RESPIRATORY W GRAM STAIN: Special Requests: NORMAL

## 2015-04-20 LAB — ALBUMIN, FLUID (OTHER): Albumin, Fluid: <1 g/dL

## 2015-04-20 LAB — PHOSPHORUS: PHOSPHORUS: 3.5 mg/dL (ref 2.5–4.6)

## 2015-04-20 LAB — PREPARE RBC (CROSSMATCH)

## 2015-04-20 LAB — AMYLASE, PERITONEAL FLUID: AMYLASE, PERITONEAL FLUID: 1682 U/L

## 2015-04-20 MED ORDER — DEXTROSE 50 % IV SOLN
INTRAVENOUS | Status: AC
  Administered 2015-04-20: 50 mL via INTRAVENOUS
  Filled 2015-04-20: qty 50

## 2015-04-20 MED ORDER — CALCIUM GLUCONATE 10 % IV SOLN
2.0000 g | Freq: Once | INTRAVENOUS | Status: AC
  Administered 2015-04-20: 2 g via INTRAVENOUS
  Filled 2015-04-20: qty 20

## 2015-04-20 MED ORDER — SODIUM CHLORIDE 0.9 % IV SOLN
1.0000 g | Freq: Once | INTRAVENOUS | Status: AC
  Administered 2015-04-20: 1 g via INTRAVENOUS
  Filled 2015-04-20: qty 10

## 2015-04-20 MED ORDER — SODIUM CHLORIDE 0.9 % IV SOLN
Freq: Once | INTRAVENOUS | Status: AC
  Administered 2015-04-20: 07:00:00 via INTRAVENOUS

## 2015-04-20 MED ORDER — SODIUM CHLORIDE 0.9 % IV SOLN: Freq: Once | INTRAVENOUS | Status: DC

## 2015-04-20 MED ORDER — DEXTROSE 50 % IV SOLN
INTRAVENOUS | Status: AC
  Filled 2015-04-20: qty 50

## 2015-04-20 MED ORDER — DEXTROSE 50 % IV SOLN
INTRAVENOUS | Status: AC
  Administered 2015-04-20: 50 mL
  Filled 2015-04-20: qty 50

## 2015-04-20 MED ORDER — "THROMBI-PAD 3""X3"" EX PADS"
1.0000 | MEDICATED_PAD | Freq: Once | CUTANEOUS | Status: AC
  Administered 2015-04-20: 1 via TOPICAL
  Filled 2015-04-20: qty 1

## 2015-04-20 NOTE — Procedures (Signed)
PARACENTESIS  Indication: Ascites  Pre-op diagnosis: Sickle cell disease, acute hepatic failure, ascites  Post-op diagnosis: Same  Procedure: Explained to pt's father and consent time.  Time out performed.  Done in ICU.  Rt lower quadrant prepped and drapped.  Given 5 ml of 1% lidocaine.  Inserted 20 gauge needle using Z track.  Drained 1000 ml of red colored fluid.  No immediate complications.  Plan: Will send fluid for cell count, chemical analysis, cytology, and culture.  Joan HellingVineet Anara Cowman, MD [facility] Pulmonary/Critical Care 04/20/2015, 12:03 PM Pager:  [phone] After 3pm call: [phone]

## 2015-04-20 NOTE — Progress Notes (Signed)
Dr. Maisie Fushomas spoke to the patient's family today at the request of CCM.  Unfortunately, there is nothing to offer surgically at this point for the patient.  Given her DIC, the concern would be uncontrolled bleeding if we operated on her.  The other concerns, based off of her labs, are that she may already have some ischemia of her organs, such as pancreas, liver, etc.  These can not be removed and therefore, nothing to offer.  The family understood this.  All questions were answered.  Further care per primary service.  Please call us back for further questions or concerns.  Danella Philson E 2:12 PM 04/20/2015

## 2015-04-20 NOTE — Progress Notes (Signed)
CRITICAL VALUE ALERT  Critical value received:  Hgb 6.7  Date of notification:  04/20/2015  Time of notification:  0500  Critical value read back:Yes.    Nurse who received alert:  Bosie HelperS. Jamara Vary, RN  MD notified (1st page):  Sommer  Time of first page:  0501  MD notified (2nd page):  Time of second page:  Responding MD:  Arsenio LoaderSommer  Time MD responded:  [phone]

## 2015-04-20 NOTE — Progress Notes (Signed)
CRITICAL VALUE ALERT  Critical value received:  Lactic Acid 13.5  Date of notification:  04/20/2015  Time of notification:  2217  Critical value read back:Yes.    Nurse who received alert:  Bosie HelperS. Benton Tooker, RN  MD notified (1st page):  Ramaswamy  Time of first page:  2220  MD notified (2nd page):  Time of second page:  Responding MD:  Marchelle Gearingamaswamy  Time MD responded:  2220

## 2015-04-20 NOTE — Progress Notes (Signed)
eLink Physician-Brief Progress Note Patient Name: Joan Martin DOB: 08/03/1986 MRN: [id number]  Date of Service  04/20/2015   HPI/Events of Note    Recent Labs Lab 04/18/15 0222  04/19/15 0456 04/20/15 0410 04/20/15 2100  LATICACIDVEN 8.0*  < > 7.8* 12.1* 13.5*  PROCALCITON 2.74  --  6.75  --   --   < > = values in this interval not displayed.   eICU Interventions  She is on max support Lactic acidosis is worse Non survivable  Continue full medical care   Intervention Category Intermediate Interventions: diag test eval  Cristian Grieves 04/20/2015, 10:31 PM

## 2015-04-20 NOTE — Progress Notes (Signed)
eLink Physician-Brief Progress Note Patient Name: Joan Martin DOB: 08/10/1986 MRN: [id number]  Date of Service  04/20/2015   HPI/Events of Note  RN calling eMD. Family wants to know if liver transplant candidate   eICU Interventions  Review of chart shuggests she is in multi organ failure and would not bea liver transplant candidate but best answered by bedside MD during rounds   Intervention Category ntermediate Interventions: Communication  [name] 04/20/2015, 5:13 PM

## 2015-04-20 NOTE — Progress Notes (Signed)
CRITICAL VALUE ALERT  Critical value received:  Ca 5.8  Date of notification:  04/20/2015  Time of notification:  0517  Critical value read back:Yes.    Nurse who received alert:  Bosie HelperS. Lawan Nanez, RN  MD notified (1st page):  Sommer  Time of first page:  0517  MD notified (2nd page):  Time of second page:  Responding MD:  Arsenio LoaderSommer   Time MD responded:  [phone]

## 2015-04-20 NOTE — Progress Notes (Signed)
PULMONARY / CRITICAL CARE MEDICINE   Name: Joan Martin MRN: [id number] DOB: Apr 17, 1986    ADMISSION DATE:  04/04/2015 CONSULTATION DATE:  04/17/2015  REFERRING MD :  Dr. Ashley Royalty  CHIEF COMPLAINT: sickle cell crisis  INITIAL PRESENTATION:  29 y/o female admitted with sickle cell crisis.  Found to have liver mass.  Had Bx of on 6/28 >> developed hemoperitoneum, hemorrhagic shock, AKI, VDRF post-procedure.  STUDIES:  6/22 US abdomen >> cirrhosis, 9.6 cm mass Rt lobe of liver, ascites 6/23 MRI abdomen >> cirrhosis, macrolobulated portion of liver in Rt lobe 6/28 CT abd/pelvis >> hemoperitoneum  SIGNIFICANT EVENTS: 6/20  Admitted for sickle cell crisis 6/28  Liver biopsy >> hemoperitoneum; Embolization of Lt hepatic artery 6/29  Start CRRT 6/29  Transfused 4 units PRBC, 4 FFP, 2 Platelets.  To IR for repeat embolization of L hepatic artery 7/01  Preliminary path of liver bx >> c/w sickle cell anemia hepatopathy, no significant cirrhosis      SUBJECTIVE:  RN reports pt with rising vasopressor needs, received 1 unit PRBC's for hgb of 6.7.  Hypoglycemia this am, CBG to 40.    VITAL SIGNS: Temp:  [95.5 F (35.3 C)-99.1 F (37.3 C)] 99.1 F (37.3 C) (07/01 0930) Pulse Rate:  [92-120] 119 (07/01 0753) Resp:  [20-29] 28 (07/01 0930) BP: (94-100)/(40-43) 100/40 mmHg (07/01 0753) SpO2:  [92 %-100 %] 96 % (07/01 0930) Arterial Line BP: (79-135)/(38-57) 108/41 mmHg (07/01 0930) FiO2 (%):  [30 %] 30 % (07/01 0759) Weight:  [170 lb 13.7 oz (77.5 kg)] 170 lb 13.7 oz (77.5 kg) (07/01 0500)   HEMODYNAMICS: CVP:  [6 mmHg-11 mmHg] 11 mmHg   VENTILATOR SETTINGS: Vent Mode:  [-] PRVC FiO2 (%):  [30 %] 30 % Set Rate:  [28 bmp] 28 bmp Vt Set:  [490 mL] 490 mL PEEP:  [5 cmH20] 5 cmH20 Plateau Pressure:  [21 cmH20-27 cmH20] 21 cmH20   INTAKE / OUTPUT:  Intake/Output Summary (Last 24 hours) at 04/20/15 0934 Last data filed at 04/20/15 0900  Gross per 24 hour  Intake 4405.72 ml  Output    1909 ml  Net 2496.72 ml    PHYSICAL EXAMINATION: General: critically ill appearing adult female on vent Neuro: RASS -2, opens eyes  HEENT: ETT in place, sclera icterus  Cardiovascular: regular, no murmur Lungs: even/non-labored, lungs coarse bilaterally  Abdomen: firm, distended, absent bowel sounds Musculoskeletal: generalized 1+ edema Skin: no rashes.  Jaundice   LABS:  CBC  Recent Labs Lab 04/19/15 0500 04/19/15 2150 04/20/15 0410  WBC 25.2* 28.2* 35.7*  HGB 8.1* 7.0* 6.7*  HCT 22.9* 20.3* 19.8*  PLT 176 154 154   Coag's  Recent Labs Lab 04/18/15 1110 04/18/15 2020 04/19/15 0456 04/19/15 2150  APTT 78* 68*  --   --   INR 1.90* 1.86*  1.88* 2.01* 2.13*     BMET  Recent Labs Lab 04/19/15 0456 04/19/15 2150 04/20/15 0410  NA 139 136 134*  K 4.0 4.3 5.3*  CL 106 105 103  CO2 16* 13* 12*  BUN CREATININE 0.39* 0.47 <0.30*  GLUCOSE 94 110* 57*   Electrolytes  Recent Labs Lab 04/18/15 0222  04/19/15 0456 04/19/15 2150 04/20/15 0410  CALCIUM 7.5*  < > 6.1* 5.6* 5.8*  MG 2.2  --  2.1  --  2.3  PHOS 10.0*  --  3.6  3.5 2.9 3.6  3.5  < > = values in this interval not displayed. Sepsis Markers  Recent  Labs Lab 04/18/15 0222 04/18/15 2020 04/19/15 0456 04/20/15 0410  LATICACIDVEN 8.0* 10.4* 7.8* 12.1*  PROCALCITON 2.74  --  6.75  --    ABG  Recent Labs Lab 04/18/15 1153 04/18/15 1600 04/19/15 0444  PHART 7.410 7.295* 7.372  PCO2ART 20.1* 22.3* 27.1*  PO2ART 105* 74.6* 77.7*   Liver Enzymes  Recent Labs Lab 04/18/15 2020 04/19/15 0456 04/19/15 2150 04/20/15 0410  AST 6302* 9238*  --  7289*  ALT 981* 1331*  --  1312*  ALKPHOS 291* 354*  --  395*  BILITOT 33.7* 35.4*  --  31.0*  ALBUMIN 2.0* 1.9*  1.9* 1.9* 1.9*  1.8*   Glucose  Recent Labs Lab 04/19/15 2153 04/20/15 0032 04/20/15 0411 04/20/15 0613 04/20/15 0845 04/20/15 0913  GLUCAP 81 74 67 94 46* 131*    Imaging Dg Chest Port 1 View  04/20/2015    CLINICAL DATA:  Respiratory failure  EXAM: PORTABLE CHEST - 1 VIEW  COMPARISON:  04/19/2015  FINDINGS: The endotracheal tube is 1.5 cm above the carina. There is a right jugular central line extending to the cavoatrial junction. The nasogastric tube extends into the stomach. There is mild consolidation in the bases, worsened. There is no pneumothorax.  IMPRESSION: Support equipment appears satisfactorily positioned.  Mildly worsened basilar opacities.   Electronically Signed   By: Ellery Plunkaniel R Mitchell M.D.   On: 04/20/2015 06:33     ASSESSMENT / PLAN:  PULMONARY ETT 6/28 >> A:  Acute respiratory failure 2nd to hemorrhagic shock, encephalopathy, AKI, metabolic acidosis. Respiratory Alkalosis  Hx of asthma. P:   MV support, 8cc/kg  F/u CXR, ABG Use brovana/pulmicort in place of advair for now PRN albuterol  CARDIOVASCULAR Rt IJ CVL 6/28 >> Lt femoral HD cath 6/28 >> A line 6/28 >> A:  Hemorrhagic shock 2nd to hemoperitoneum. P:  Continue IV fluids, blood product resuscitation  Goal CVP > 8 Pressors as needed to keep MAP > 65  RENAL A:   AKI 2nd to hemorrhagic shock >> baseline creatine 0.76 from July 04, 2015. Metabolic acidosis 2nd to lactic acidosis and renal failure. Rhabdomyolysis  Hyperkalemia. P:   CRRT initiated 6/29 per Renal F/u BMET, ABG Avoid nephrotoxic agents   GASTROINTESTINAL A:   Acute Hepatic Failure >> s/p Lt hepatic lobe liver bx 6/28 for suspected cirrhosis.  Prelim biopsy does not show cirrhosis, more concerning for sickle cell anemia hepatopathy  Abdominal distention. Elevated Lipase  Concern for Bowel Ischemia - rising LDH, lactic acid Hypothermia  P:   Await final liver bx results Protonix for SUP Monitor bladder pressure for abdominal compartment syndrome F/u LFT's, LDH Avoid acetaminophen in setting of liver disease Consider follow up CT ABD to assess pancreas / hemoperitoneum  Goal for normothermia  Will ask CCS to review 7/1 to see if there  are any further surgical options  HEMATOLOGIC A:   Sickle cell crisis. Hemorrhagic shock 2nd to hemoperitoneum with consumption coagulopathy. P:  F/u CBC, coag's No indication for exchange transfusion at present Transfuse as needed for bleeding  INFECTIOUS A:   Shock - hemorrhagic; less concern for sepsis, but procalcitonin mildly elevated 6/29. P:   Day 4 vancomycin, primaxin  BCx2 6/28 >> UC 6/28 >> Sputum 6/28 >> neg  ENDOCRINE A:   Hypoglycemia. P:   D5NS at 7175ml/hr with hypoglycemia  CBG q4h   NEUROLOGIC A:    Acute metabolic encephalopathy with elevate ammonia level 6/28. Chronic headaches. P:   RASS goal -1 Hold topamax for now  GLOBAL:  Overall prognosis concerning for devastating event.  Arrange for family meeting with parents & siblings.  Dr. Ashley Royalty updated on status.  Will ask CCS to review for any further surgical options.    Canary Brim, NP-C [place] Pulmonary & Critical Care Pgr: [phone] or if no answer [phone] 04/20/2015, 9:34 AM  Reviewed above and examined.  She has increasing pressors needs.  LFT's still high, oozing from IV sites, and lactic acid elevated.  Her abdomen is firm and more distended.  She is less response.  Heart rate tachycardic, basilar crackles, 1+ edema.  Labs and CXR from 7/01 reviewed.  Concern is that she has abdominal hypertension from ascites or she has infarcted bowel after episode of bleeding with shock and hypoxia.    Continue full vent support, pressors, CRRT.  Will proceed with diagnostic/therapeutic paracentesis.  Will ask surgery to re-assess >> doubt she could survive if she needed surgery.  F/u CBC, coags and transfuse as needed.  Had detailed d/w pt's parents.  Explained that she is critically ill with multi-organ failure.  If in fact she does have infarcted bowel, then it is likely she might be able to survive this illness.  D/w Dr. Ashley Royalty.  CC time by me independent of APP time and procedure time is  60 minutes.  Coralyn Helling, MD [facility] Pulmonary/Critical Care 04/20/2015, 12:09 PM Pager:  [phone] After 3pm call: [phone]

## 2015-04-20 NOTE — Progress Notes (Signed)
Critical Value of Total Bilirubin of 26.9 received on 04/20/2015 at 1930 . Value is an improvement from previous results. Bosie HelperS. Leonte Horrigan, RN

## 2015-04-20 NOTE — Progress Notes (Signed)
eLink Physician-Brief Progress Note Patient Name: Joan Martin DOB: 05/21/1986 MRN: [id number]  Date of Service  04/20/2015   HPI/Events of Note  Lab review woprsening shock liver Continued low calcium   eICU Interventions  Replete calcium Check lactate   Intervention Category Major Interventions: Hemorrhage - evaluation and management Intermediate Interventions: Electrolyte abnormality - evaluation and management  Daisuke Bailey 04/20/2015, 8:42 PM

## 2015-04-20 NOTE — Progress Notes (Signed)
Joan Martin   DOB:08/11/1986   WU#:[id number]   GNF#:[id number]  Subjective: Pt remains to be on MV, and CRRT. Hypotensive, on pressor. She has slow bleeding from multiple IV lines and mouth. She received a total of 8uRBC, 6uFFP, and 4u plts since she was transferred to ICU. She underwent paracentesis today with 1L bloody ascites removed by Dr. Craige Cotta.     Objective:  Filed Vitals:   04/20/15 1530  BP:   Pulse:   Temp: 95.4 F (35.2 C)  Resp: 28    Body mass index is 26.75 kg/(m^2).  Intake/Output Summary (Last 24 hours) at 04/20/15 1559 Last data filed at 04/20/15 1500  Gross per 24 hour  Intake 4962.27 ml  Output   2120 ml  Net 2842.27 ml    Pt on mechanical ventilator, sedated  Sclerae (+) jaundice   No peripheral adenopathy  Lungs clear -- no rales or rhonchi  Heart regular rate and rhythm  Abdomen (+) very distended and firm   (+) bleeding at multiple iV line insertion sites    CBG (last 3)   Recent Labs  04/20/15 0845 04/20/15 0913 04/20/15 1408  GLUCAP 46* 131* 102*     Labs:  Lab Results  Component Value Date   WBC 35.7* 04/20/2015   HGB 6.7* 04/20/2015   HCT 19.8* 04/20/2015   MCV 87.6 04/20/2015   PLT 154 04/20/2015   NEUTROABS 16.1* 04/18/2015    @LASTCHEMISTRY @  Urine Studies No results for input(s): UHGB, CRYS in the last 72 hours.  Invalid input(s): UACOL, UAPR, USPG, UPH, UTP, UGL, [place], UBIL, UNIT, UROB, [place], UEPI, UWBC, Ayesha Rumpf [place], [place], [place]  Basic Metabolic Panel:  Recent Labs Lab 04/18/15 0222 04/18/15 1110 04/18/15 2020 04/19/15 0456 04/19/15 2150 04/20/15 0410  NA 138 141 141 139 136 134*  K 7.2* 4.7 4.3 4.0 4.3 5.3*  CL 114* 108 107 106 105 103  CO2 9* 15* 14* 16* 13* 12*  GLUCOSE 96 102* 74 94 110* 57*  BUN 21* 20 20 17 12 10   CREATININE 1.09* 0.61 0.61 0.39* 0.47 <0.30*  CALCIUM 7.5* 7.1* 6.4* 6.1* 5.6* 5.8*  MG 2.2  --   --  2.1  --  2.3  PHOS 10.0*  --   --  3.6  3.5 2.9 3.6  3.5   GFR CrCl cannot be  calculated (Patient has no serum creatinine result on file.). Liver Function Tests:  Recent Labs Lab 04/17/15 0353 04/18/15 0222 04/18/15 2020 04/19/15 0456 04/19/15 2150 04/20/15 0410  AST 347* 948* 6302* 9238*  --  7289*  ALT 101* 183* 981* 1331*  --  1312*  ALKPHOS 506* 345* 291* 354*  --  395*  BILITOT 27.9* 25.5* 33.7* 35.4*  --  31.0*  PROT 5.6* 4.6* 4.9* 5.0*  --  4.9*  ALBUMIN 1.5* 1.2* 2.0* 1.9*  1.9* 1.9* 1.9*  1.8*    Recent Labs Lab 04/18/15 2020 04/20/15 0410  LIPASE >3000* 2354*    Recent Labs Lab 04/17/15 2347  AMMONIA 141*   Coagulation profile  Recent Labs Lab 04/17/15 2244 04/18/15 1110 04/18/15 2020 04/19/15 0456 04/19/15 2150  INR 2.09* 1.90* 1.86*  1.88* 2.01* 2.13*    CBC:  Recent Labs Lab 04/15/15 0611 04/17/15 0353  04/18/15 0222 04/18/15 1110 04/18/15 2020 04/19/15 0500 04/19/15 2150 04/20/15 0410  WBC 23.8* 24.1*  < > 32.3*  --  22.5* 25.2* 28.2* 35.7*  NEUTROABS 8.8* 12.3*  --  22.7*  --  16.1*  --   --   --  HGB 7.1* 6.9*  < > 5.5* 5.7* 7.6* 8.1* 7.0* 6.7*  HCT 20.6* 19.7*  < > 16.7* 17.0* 22.4* 22.9* 20.3* 19.8*  MCV 96.3 96.6  < > 96.0  --  85.2 83.6 86.4 87.6  PLT 165 152  < > 112*  --  185  167 176 154 154  < > = values in this interval not displayed. Cardiac Enzymes:  Recent Labs Lab 04/18/15 2020 04/19/15 0456 04/20/15 0410  CKTOTAL 684* 1726* 2236*  CKMB 24.1*  --   --   TROPONINI 0.15*  --   --    BNP: Invalid input(s): POCBNP CBG:  Recent Labs Lab 04/20/15 0411 04/20/15 0613 04/20/15 0845 04/20/15 0913 04/20/15 1408  GLUCAP 67 94 46* 131* 102*   D-Dimer  Recent Labs  04/18/15 2020  DDIMER >20.00*   Hgb A1c No results for input(s): HGBA1C in the last 72 hours. Lipid Profile No results for input(s): CHOL, HDL, LDLCALC, TRIG, CHOLHDL, LDLDIRECT in the last 72 hours. Thyroid function studies No results for input(s): TSH, T4TOTAL, T3FREE, THYROIDAB in the last 72 hours.  Invalid  input(s): FREET3 Anemia work up  [facility]  04/17/15 2236 04/18/15 0222  RETICCTPCT 19.5* 14.2*   Microbiology Recent Results (from the past 240 hour(s))  MRSA PCR Screening     Status: None   Collection Time: 04/17/15  8:36 PM  Result Value Ref Range Status   MRSA by PCR NEGATIVE NEGATIVE Final    Comment:        The GeneXpert MRSA Assay (FDA approved for NASAL specimens only), is one component of a comprehensive MRSA colonization surveillance program. It is not intended to diagnose MRSA infection nor to guide or monitor treatment for MRSA infections.   Culture, blood (routine x 2)     Status: None (Preliminary result)   Collection Time: 04/17/15 10:37 PM  Result Value Ref Range Status   Specimen Description BLOOD BLOOD LEFT FOREARM  Final   Special Requests BOTTLES DRAWN AEROBIC ONLY 6CC  Final   Culture   Final    NO GROWTH 2 DAYS [facility]    Report Status PENDING  Incomplete  Culture, respiratory (NON-Expectorated)     Status: None   Collection Time: 04/18/15 12:02 AM  Result Value Ref Range Status   Specimen Description TRACHEAL ASPIRATE  Final   Special Requests Normal  Final   Gram Stain   Final    NO WBC SEEN RARE SQUAMOUS EPITHELIAL CELLS PRESENT RARE GRAM POSITIVE RODS Performed at [facility]    Culture   Final    Non-Pathogenic Oropharyngeal-type Flora Isolated. Performed at [facility]    Report Status 04/20/2015 FINAL  Final  Urine culture     Status: None   Collection Time: 04/18/15 11:15 AM  Result Value Ref Range Status   Specimen Description URINE, CATHETERIZED  Final   Special Requests Normal  Final   Culture   Final    NO GROWTH 2 DAYS Performed at [facility]    Report Status 04/20/2015 FINAL  Final  Culture, blood (routine x 2)     Status: None (Preliminary result)   Collection Time: 04/18/15 11:50 PM  Result Value Ref Range Status   Specimen Description BLOOD BLOOD LEFT HAND   Final   Special Requests BOTTLES DRAWN AEROBIC ONLY 4ML  Final   Culture   Final    NO GROWTH 2 DAYS [facility]  Report Status PENDING  Incomplete  Culture, body fluid-bottle     Status: None (Preliminary result)   Collection Time: 04/20/15 12:00 PM  Result Value Ref Range Status   Specimen Description FLUID PARACENTESIS  Final   Special Requests NONE  Final   Culture PENDING  Incomplete   Report Status PENDING  Incomplete      Studies:  Reviewed   ASSESSMENT & PLAN:  29 year old African-American female, who is known history of sickle cell SS disease, presented with worsening anemia and pain crisis.  She was found to have liver cirrhosis on CT, and underwent liver biopsy on 6/28. She developed hemorrhagic shock after biopsy, acute renal failure with metabolic acidosis and hypercalcemia, and got intubated.   1. Hemorrhagic shock after liver biopsy, status post left hepatic artery embolization 6/28 2. Severe anemia from bleeding and baseline sickle cell disease and hemolysis, no evidence of autoimmune hemostasis 3. Liver cirrhosis, and fulminant liver failure  4. 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 AKI  6. Hb SS with crisis  7. 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 Thrombocytopenia, likely secondary to acute bleeding  Recommendations: -unfortunately she has multiorgan failure, the overall prognosis is extremely poor  -Her coagulapathy has been difficult to correct despite daily FFP, due to her fulminant liver failure and DIC. Consider Vit K  iv once to see if her INR improves. If not and INR persistent above 2, and she is continuously bleeding, may consider prothrombin complex concentrates 25u/kg, once to see if it slow down the bleeding.  -Continue monitor PT/INR and fibrinogen closely, continue FFP transfusion and cryo if fibrinogen <100 to improve her coagulopathy  -Her abdominal bleeding is not mainly due to her coagulopathy,  so the benefit of using  hemostasis agent such as novo seven is limited  -continue supportive care, blood transfusion if Hb<7. Given her multiple prior blood transfusion, potential allo-antibody from blood transfusion, and history of possible transfusion-related hemoptysis, need to inform the blood bank for careful antibody workup before blood transfusion.  -other management per ICU, GI and renal team  I will be out of office until 7/11. I will ask my partner Dr. Myna Hidalgo to see her  over the weekend, and Dr. Bertis Ruddy will cover for me next week.   Malachy Mood, MD 04/20/2015  3:59 PM

## 2015-04-20 NOTE — Progress Notes (Signed)
Chaplain visited patient during rounds. Chaplain provided a presence although there was no family present at the time of visit. Chaplain  became aware that the family received some not so good news regarding patient. Chaplain is praying with the family. Chaplain on call is available for follow up.   04/20/15 1600  Clinical Encounter Type  Visited With Patient  Visit Type Follow-up;Spiritual support;Social support  Referral From [employer]

## 2015-04-20 NOTE — Progress Notes (Signed)
Nutrition Follow-up  DOCUMENTATION CODES:  Not applicable  INTERVENTION: - When/if TF possible, recommend: Vital AF 1.2 @ 60 mL/hr with Prostat once/day to provide 1828 kcal, 123 grams protein, and 1168 mL free water (this will provide 62 mEq K). - RD will continue to monitor for needs  NUTRITION DIAGNOSIS:  Inadequate oral intake related to inability to eat as evidenced by NPO status. -ongoing  GOAL:  Patient will meet greater than or equal to 90% of their needs -unmet  MONITOR:  Vent status, Labs, Weight trends, Skin, I & O's  ASSESSMENT: 29 year old female with sickle cell anemia admitted for crisis 6/20. She was feeling better initially, however liver function continued to worsen. Had biopsy to further evaluate 6/28. Later than PM she had acute decompensation and acidosis.  7/1: - Pt remains intubated on vent support - mVe: 13.4 L/min - No Propofol - TF has not been initiated - Pt on CRRT; needs re-estimated as outlined below and re-estimated TF recommendations outlined above - Pt not meeting needs. Medications reviewed; Drisdol ordered.   Labs: K: 5.3 mmol/L, creatinine low, Ca: 5.8 mg/dL, LFTs elevated, lipase: 2354 units/L  Drips: Fentanyl @ 100 mcg/hr, Levophed @ 45 mcg/min.  6/29: - Pt intubated on vent support - mVe: 13.5 L/min - No Propofol  Height:  Ht Readings from Last 1 Encounters:  04/17/15 5\' 7"  (1.702 m)    Weight:  Wt Readings from Last 1 Encounters:  04/20/15 170 lb 13.7 oz (77.5 kg)    Ideal Body Weight:  61.4 kg  Wt Readings from Last 10 Encounters:  04/20/15 170 lb 13.7 oz (77.5 kg)  03/28/15 135 lb (61.236 kg)  01/17/15 131 lb 12.8 oz (59.784 kg)  11/23/14 136 lb (61.689 kg)  10/19/14 136 lb (61.689 kg)  07/31/14 134 lb (60.782 kg)  07/31/14 134 lb (60.782 kg)  06/21/14 133 lb (60.328 kg)  05/25/14 135 lb (61.236 kg)  05/01/14 132 lb (59.875 kg)    BMI:  Body mass index is 26.75 kg/(m^2).  Estimated Nutritional  Needs:  Kcal:  1900  Protein:  116-130 grams  Fluid:  1.2-1.5 L/day  Skin:  Reviewed, no issues  Diet Order:  Diet NPO time specified  EDUCATION NEEDS:  No education needs identified at this time   Intake/Output Summary (Last 24 hours) at 04/20/15 1131 Last data filed at 04/20/15 1100  Gross per 24 hour  Intake 4527.5 ml  Output   1507 ml  Net 3020.5 ml    Last BM:  6/30    Trenton GammonJessica Favor Hackler, RD, LDN Inpatient Clinical Dietitian Pager # [phone] After hours/weekend pager # [phone]

## 2015-04-20 NOTE — Progress Notes (Signed)
[facility] Progress Note   Subjective: had 2nd embolization procedure and Hb holding now ~ 8. Lipase up >3000. AST/ ALT rising , wbc down, plts stable/ normal, Total and direct bilit up slightly today to 35/23.  up . Last INR 2.0 and last PTT up at 68  Filed Vitals:   04/20/15 1210 04/20/15 1235 04/20/15 1245 04/20/15 1300  BP:  105/38    Pulse:  107    Temp: 97.7 F (36.5 C)  97.3 F (36.3 C) 97 F (36.1 C)  TempSrc:      Resp: _0 Height:      Weight:      SpO2: 100% 30% 100% 100%   Exam: On vent, sedated, poorly responsive +jvd ETT in place and OGT Chest clear ant / lat bilat RRR tachy no rub or gallop Abd tense, distended, firm, dec'd BS GU foley cath minimal output LE's 2+ diffuse edema Neuro is sedated on the vent      Assessment: 1. AKI / ATN from hemorrhagic shock - on CRRT D#3 2. Shock - on pressors, s/p embolization procedure x 2 after liver bx 3. Cirrhosis s/p bx results pending 4. [place] Dura / prob pancreatitis 5. Met acidosis - pH good on last ABG yesterday 6. Anemia - HB more stable 7. Abd distension/ ascites - had paracentesis today 8. Volume - cvp is normal now, weights are up approx 15kg . CXR clear, on 30% FiO2  Plan - cont CRRT, keep even, no heparin    Kelly Splinter MD  pager [phone]    cell [phone]  04/20/2015, 1:28 PM     Recent Labs Lab 04/19/15 0456 04/19/15 2150 04/20/15 0410  NA 139 136 134*  K 4.0 4.3 5.3*  CL 106 105 103  CO2 16* 13* 12*  GLUCOSE 94 110* 57*  BUN _1 CREATININE 0.39* 0.47 <0.30*  CALCIUM 6.1* 5.6* 5.8*  PHOS 3.6  3.5 2.9 3.6  3.5    Recent Labs Lab 04/18/15 2020 04/19/15 0456 04/19/15 2150 04/20/15 0410  AST 6302* 9238*  --  7289*  ALT 981* 1331*  --  1312*  ALKPHOS 291* 354*  --  395*  BILITOT 33.7* 35.4*  --  31.0*  PROT 4.9* 5.0*  --  4.9*  ALBUMIN 2.0* 1.9*  1.9* 1.9* 1.9*  1.8*    Recent Labs Lab 04/17/15 0353  04/18/15 0222  04/18/15 2020  04/19/15 0500 04/19/15 2150 04/20/15 0410  WBC 24.1*  < > 32.3*  --  22.5* 25.2* 28.2* 35.7*  NEUTROABS 12.3*  --  22.7*  --  16.1*  --   --   --   HGB 6.9*  < > 5.5*  < > 7.6* 8.1* 7.0* 6.7*  HCT 19.7*  < > 16.7*  < > 22.4* 22.9* 20.3* 19.8*  MCV 96.6  < > 96.0  --  85.2 83.6 86.4 87.6  PLT 152  < > 112*  --  185  167 176 154 154  < > = values in this interval not displayed. . sodium chloride   Intravenous Once  . antiseptic oral rinse  7 mL Mouth Rinse QID  . arformoterol  15 mcg Nebulization BID  . budesonide (PULMICORT) nebulizer solution  0.25 mg Nebulization BID  . calcium gluconate IVPB  4 g Intravenous Once  . chlorhexidine  15 mL Mouth Rinse BID  . fentaNYL (SUBLIMAZE) injection  100 mcg Intravenous Once  . folic acid  1 mg  Intravenous Daily  . heparin  1,000 Units Intracatheter Once  . imipenem-cilastatin  250 mg Intravenous 4 times per day  . pantoprazole (PROTONIX) IV  40 mg Intravenous Q24H  . THROMBI-PAD  1 each Topical Once  . vancomycin  1,000 mg Intravenous Q24H  . Vitamin D (Ergocalciferol)  50,000 Units Oral Q Sun   . citrate dextrose    . dextrose 75 mL/hr at 04/20/15 1015  . fentaNYL infusion INTRAVENOUS 100 mcg/hr (04/20/15 0930)  . norepinephrine (LEVOPHED) Adult infusion 50 mcg/min (04/20/15 1014)  . dialysis replacement fluid (prismasate) 400 mL/hr at 04/20/15 1039  . dialysis replacement fluid (prismasate) 400 mL/hr at 04/19/15 2148  . dialysate (PRISMASATE) 1,800 mL/hr at 04/20/15 1216   albuterol, artificial tears, diphenhydrAMINE, fentaNYL, heparin, midazolam, promethazine, sodium chloride

## 2015-04-20 NOTE — Progress Notes (Signed)
eLink Physician-Brief Progress Note Patient Name: Rogers Seedsatrice Jost DOB: 06/14/1986 MRN: [id number]   Date of Service  04/20/2015  HPI/Events of Note  Multiple issues: 1. Ca++ = 5.8 and albumin = 1.9 and 2. Hgb = 6.7.  eICU Interventions  Will order: 1. Transfuse 1 unit PRBC. 2. Replete Ca++.      Intervention Category Major Interventions: Hemorrhage - evaluation and management Intermediate Interventions: Electrolyte abnormality - evaluation and management  Sommer,Steven Eugene 04/20/2015, 5:22 AM

## 2015-04-20 NOTE — Progress Notes (Signed)
CRITICAL VALUE ALERT  Critical value received:  Lactic Acid 12.1  Date of notification:  04/20/2015  Time of notification:  0545  Critical value read back:Yes.    Nurse who received alert:  C. Marga Hootsakley, RN   MD notified (1st page):  Sommer  Time of first page:  [phone]  MD notified (2nd page):  Time of second page:  Responding MD:  Arsenio LoaderSommer  Time MD responded:  [phone]

## 2015-04-20 DEATH — deceased

## 2015-04-21 DIAGNOSIS — K7290 Hepatic failure, unspecified without coma: Secondary | ICD-10-CM

## 2015-04-21 LAB — TYPE AND SCREEN
ABO/RH(D): O POS
ANTIBODY SCREEN: NEGATIVE
UNIT DIVISION: 0
UNIT DIVISION: 0
Unit division: 0
Unit division: 0
Unit division: 0
Unit division: 0
Unit division: 0

## 2015-04-21 LAB — HEPATIC FUNCTION PANEL
ALT: 1677 U/L — AB (ref 14–54)
Albumin: 1.6 g/dL — ABNORMAL LOW (ref 3.5–5.0)
Alkaline Phosphatase: 573 U/L — ABNORMAL HIGH (ref 38–126)
BILIRUBIN DIRECT: 17.5 mg/dL — AB (ref 0.1–0.5)
BILIRUBIN INDIRECT: 8.4 mg/dL — AB (ref 0.3–0.9)
Total Bilirubin: 25.9 mg/dL (ref 0.3–1.2)
Total Protein: 4.3 g/dL — ABNORMAL LOW (ref 6.5–8.1)

## 2015-04-21 LAB — RENAL FUNCTION PANEL
Albumin: 1.6 g/dL — ABNORMAL LOW (ref 3.5–5.0)
Anion gap: 22 — ABNORMAL HIGH (ref 5–15)
BUN: 7 mg/dL (ref 6–20)
CO2: 10 mmol/L — AB (ref 22–32)
Calcium: 5.9 mg/dL — CL (ref 8.9–10.3)
Chloride: 100 mmol/L — ABNORMAL LOW (ref 101–111)
Creatinine, Ser: 0.44 mg/dL (ref 0.44–1.00)
GFR calc Af Amer: >60 mL/min (ref >60)
GFR calc non Af Amer: >60 mL/min (ref >60)
GLUCOSE: 87 mg/dL (ref 65–99)
Phosphorus: 3.8 mg/dL (ref 2.5–4.6)
Potassium: 5.1 mmol/L (ref 3.5–5.1)
SODIUM: 132 mmol/L — AB (ref 135–145)

## 2015-04-21 LAB — CBC
HCT: 23 % — ABNORMAL LOW (ref 36.0–46.0)
Hemoglobin: 7.5 g/dL — ABNORMAL LOW (ref 12.0–15.0)
MCH: 29.2 pg (ref 26.0–34.0)
MCHC: 32.6 g/dL (ref 30.0–36.0)
MCV: 89.5 fL (ref 78.0–100.0)
Platelets: 97 10*3/uL — ABNORMAL LOW (ref 150–400)
RBC: 2.57 MIL/uL — AB (ref 3.87–5.11)
RDW: 19.1 % — AB (ref 11.5–15.5)
WBC: 34.7 10*3/uL — AB (ref 4.0–10.5)

## 2015-04-21 LAB — PREPARE FRESH FROZEN PLASMA
UNIT DIVISION: 0
Unit division: 0

## 2015-04-21 LAB — LACTATE DEHYDROGENASE: LDH: >10000 U/L — ABNORMAL HIGH (ref 98–192)

## 2015-04-21 LAB — BLOOD GAS, ARTERIAL
ACID-BASE DEFICIT: 16.4 mmol/L — AB (ref 0.0–2.0)
Bicarbonate: 10.3 mEq/L — ABNORMAL LOW (ref 20.0–24.0)
DRAWN BY: 11249
FIO2: 0.3 %
MECHVT: 490 mL
O2 Saturation: 87.4 %
PCO2 ART: 28.2 mmHg — AB (ref 35.0–45.0)
PEEP/CPAP: 5 cmH2O
PH ART: 7.188 — AB (ref 7.350–7.450)
Patient temperature: 37.2
RATE: 28 resp/min
TCO2: 10.3 mmol/L (ref 0–100)
pO2, Arterial: 60.1 mmHg — ABNORMAL LOW (ref 80.0–100.0)

## 2015-04-21 LAB — LIPASE, BLOOD: Lipase: 1527 U/L — ABNORMAL HIGH (ref 22–51)

## 2015-04-21 LAB — FIBRINOGEN: FIBRINOGEN: 132 mg/dL — AB (ref 204–475)

## 2015-04-21 LAB — GLUCOSE, CAPILLARY
GLUCOSE-CAPILLARY: 129 mg/dL — AB (ref 65–99)
Glucose-Capillary: 66 mg/dL (ref 65–99)
Glucose-Capillary: 123 mg/dL — ABNORMAL HIGH (ref 65–99)
Glucose-Capillary: 87 mg/dL (ref 65–99)
Glucose-Capillary: 92 mg/dL (ref 65–99)

## 2015-04-21 LAB — PHOSPHORUS: PHOSPHORUS: 4 mg/dL (ref 2.5–4.6)

## 2015-04-21 LAB — VANCOMYCIN, TROUGH: Vancomycin Tr: 11 ug/mL (ref 10.0–20.0)

## 2015-04-21 LAB — PROTIME-INR
INR: 3.37 — ABNORMAL HIGH (ref 0.00–1.49)
PROTHROMBIN TIME: 33.4 s — AB (ref 11.6–15.2)

## 2015-04-21 LAB — LACTIC ACID, PLASMA: Lactic Acid, Venous: 15 mmol/L (ref 0.5–2.0)

## 2015-04-21 LAB — MAGNESIUM: MAGNESIUM: 2.2 mg/dL (ref 1.7–2.4)

## 2015-04-21 MED ORDER — LORAZEPAM 2 MG/ML IJ SOLN: 1.0000 mg | INTRAMUSCULAR | Status: DC | PRN

## 2015-04-21 MED ORDER — DEXTROSE 50 % IV SOLN
INTRAVENOUS | Status: AC
  Administered 2015-04-21: 50 mL
  Filled 2015-04-21: qty 50

## 2015-04-21 MED ORDER — VASOPRESSIN 20 UNIT/ML IV SOLN
0.0300 [IU]/min | INTRAVENOUS | Status: DC
  Administered 2015-04-21: 0.03 [IU]/min via INTRAVENOUS
  Filled 2015-04-21: qty 2

## 2015-04-21 MED ORDER — SODIUM CHLORIDE 0.9 % IV SOLN
25.0000 ug/h | INTRAVENOUS | Status: DC
  Filled 2015-04-21: qty 50

## 2015-04-21 MED ORDER — FENTANYL BOLUS VIA INFUSION
50.0000 ug | INTRAVENOUS | Status: DC | PRN
  Filled 2015-04-21: qty 200

## 2015-04-21 MED ORDER — VITAMIN K1 10 MG/ML IJ SOLN
10.0000 mg | Freq: Once | INTRAMUSCULAR | Status: AC
  Administered 2015-04-21: 10 mg via INTRAVENOUS
  Filled 2015-04-21: qty 1

## 2015-04-21 MED ORDER — ATROPINE SULFATE 1 % OP SOLN
4.0000 [drp] | OPHTHALMIC | Status: DC | PRN
  Filled 2015-04-21: qty 2

## 2015-04-21 MED ORDER — ACETAMINOPHEN 650 MG RE SUPP: 650.0000 mg | RECTAL | Status: DC | PRN

## 2015-04-21 MED ORDER — SODIUM BICARBONATE 8.4 % IV SOLN
INTRAVENOUS | Status: DC
  Administered 2015-04-21: 03:00:00 via INTRAVENOUS
  Filled 2015-04-21 (×2): qty 150

## 2015-04-23 ENCOUNTER — Other Ambulatory Visit: Payer: Self-pay | Admitting: Oncology

## 2015-04-23 LAB — CULTURE, BLOOD (ROUTINE X 2)
Culture: NO GROWTH
Culture: NO GROWTH

## 2015-04-24 ENCOUNTER — Telehealth: Payer: Self-pay

## 2015-04-24 NOTE — Discharge Summary (Signed)
Joan Martin was a 29 y.o. female admitted on 04/12/2015 with sickle cell crisis and liver mass.  There was concern for changes of cirrhosis on imaging studies.  She had liver biopsy done.  She then developed severe anemia with intra-abdominal hemorrhage.  She was transferred to ICU, and intubated.  She received multiple transfusions of PRBC, FFP, and PLT.  She was started on pressor agents.  She had embolization by IR.  She developed persistent shock and metabolic acidosis.  She had progress abdominal distention.  Concern was for infarcted bowel.  She was evaluated by surgery, and was not a surgical candidate.  The severity of her illness was d/w her family, and it was explained that she would not survive her illness.  Family opted to transition to comfort care.  She was extubated and then expired on 03-Dec-2014.  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

## 2015-04-24 NOTE — Telephone Encounter (Signed)
On 04/24/2015 I received a death certificate from [employer] and [place]. The death certificate is for cremation. The patient is a patient of Doctor Sood. The death certificate will be taken to Pulmonary level for Doctor Craige CottaSood to sign the death certificate. On 04/25/2015 I received the death certificate back from Doctor [place]. I got the death certificate ready for pickup. I called the funeral home to let them know the death certificate was ready for pickup.

## 2015-04-25 ENCOUNTER — Encounter (HOSPITAL_COMMUNITY): Payer: Self-pay

## 2015-04-25 LAB — CULTURE, BODY FLUID W GRAM STAIN -BOTTLE

## 2015-04-25 LAB — CULTURE, BODY FLUID-BOTTLE: Culture: NO GROWTH

## 2015-04-27 ENCOUNTER — Ambulatory Visit: Payer: Medicaid Other | Admitting: Neurology

## 2015-05-09 ENCOUNTER — Encounter: Payer: Self-pay | Admitting: Internal Medicine

## 2015-05-21 NOTE — Progress Notes (Addendum)
PULMONARY / CRITICAL CARE MEDICINE   Name: Joan Martin MRN: [id number] DOB: 06/29/1986    ADMISSION DATE:  01/25/2015 CONSULTATION DATE:  04/17/2015  REFERRING MD :  Dr. Ashley RoyaltyMatthews  CHIEF COMPLAINT: sickle cell crisis  INITIAL PRESENTATION:  29 y/o female admitted with sickle cell crisis.  Found to have liver mass.  Had Bx of on 6/28 >> developed hemoperitoneum, hemorrhagic shock, AKI, VDRF post-procedure.  STUDIES:  6/22 US abdomen >> cirrhosis, 9.6 cm mass Rt lobe of liver, ascites 6/23 MRI abdomen >> cirrhosis, macrolobulated portion of liver in Rt lobe 6/28 CT abd/pelvis >> hemoperitoneum  SIGNIFICANT EVENTS: 6/20  Admitted for sickle cell crisis 6/28  Liver biopsy >> hemoperitoneum; Embolization of Lt hepatic artery 6/29  Start CRRT 6/29  Transfused 4 units PRBC, 4 FFP, 2 Platelets.  To IR for repeat embolization of L hepatic artery 7/01  Preliminary path of liver bx >> c/w sickle cell anemia hepatopathy, no significant cirrhosis   7/02  Refractory acidosis, shock, mental status worse, abdominal exam worse  SUBJECTIVE:   On max dose pressors.  HCO3 added overnight.   VITAL SIGNS: Temp:  [95 F (35 C)-99.3 F (37.4 C)] 99 F (37.2 C) (07/02 0600) Pulse Rate:  [102-114] 112 (07/01 2100) Resp:  [20-28] 28 (07/02 0600) BP: (105-125)/(38-42) 125/42 mmHg (07/01 1629) SpO2:  [30 %-100 %] 100 % (07/02 0600) Arterial Line BP: (99-143)/(37-46) 143/46 mmHg (07/02 0600) FiO2 (%):  [30 %] 30 % (07/02 0819) Weight:  [180 lb 12.4 oz (82 kg)] 180 lb 12.4 oz (82 kg) (07/02 0500)   VENTILATOR SETTINGS: Vent Mode:  [-] PRVC FiO2 (%):  [30 %] 30 % Set Rate:  [28 bmp] 28 bmp Vt Set:  [490 mL] 490 mL PEEP:  [5 cmH20] 5 cmH20 Plateau Pressure:  [20 cmH20-26 cmH20] 26 cmH20   INTAKE / OUTPUT:  Intake/Output Summary (Last 24 hours) at 04/26/2015 0957 Last data filed at 05/15/2015 0800  Gross per 24 hour  Intake 4991.32 ml  Output   2695 ml  Net 2296.32 ml    PHYSICAL  EXAMINATION: General: critically ill Neuro: comatose HEENT: ETT in place  Cardiovascular: regular, tachyardic Lungs: kussmaul respiratory pattern, b/l crackles Abdomen: tense, distended Musculoskeletal: generalized edema Skin: Jaundice   LABS:  CBC  Recent Labs Lab 04/19/15 2150 04/20/15 0410 04/20/15 1750 04/25/2015 0400  WBC 28.2* 35.7*  --  34.7*  HGB 7.0* 6.7* 7.4* 7.5*  HCT 20.3* 19.8* 23.0* 23.0*  PLT 154 154  --  97*   Coag's  Recent Labs Lab 04/18/15 1110 04/18/15 2020  04/19/15 2150 04/20/15 1750 05/15/2015 0400  APTT 78* 68*  --   --   --   --   INR 1.90* 1.86*  1.88*  < > 2.13* 2.65* 3.37*  < > = values in this interval not displayed.   BMET  Recent Labs Lab 04/20/15 0410 04/20/15 1750 04/27/2015 0400  NA 134* 131* 132*  K 5.3* 4.8 5.1  CL 103 100* 100*  CO2 12* 11* 10*  BUN 10 8 7   CREATININE <0.30* 0.54 0.44  GLUCOSE 57* 97 87   Electrolytes  Recent Labs Lab 04/19/15 0456  04/20/15 0410 04/20/15 1750 05/13/2015 0400  CALCIUM 6.1*  < > 5.8* 5.6* 5.9*  MG 2.1  --  2.3  --  2.2  PHOS 3.6  3.5  < > 3.6  3.5 3.6 4.0  3.8  < > = values in this interval not displayed. Sepsis Markers  Recent Labs  Lab 04/18/15 0222  04/19/15 0456 04/20/15 0410 04/20/15 2100 04/22/15 0400  LATICACIDVEN 8.0*  < > 7.8* 12.1* 13.5* 15.0*  PROCALCITON 2.74  --  6.75  --   --   --   < > = values in this interval not displayed.   ABG  Recent Labs Lab 04/18/15 1600 04/19/15 0444 2015/04/22 0229  PHART 7.295* 7.372 7.188*  PCO2ART 22.3* 27.1* 28.2*  PO2ART 74.6* 77.7* 60.1*   Liver Enzymes  Recent Labs Lab 04/20/15 0410 04/20/15 1750 April 22, 2015 0400  AST 7289* 9527* >10000*  ALT 1312* 1454* 1677*  ALKPHOS 395* 425* 573*  BILITOT 31.0* 26.9* 25.9*  ALBUMIN 1.9*  1.8* 1.8*  1.8* 1.6*  1.6*   Glucose  Recent Labs Lab 04/20/15 0845 04/20/15 0913 04/20/15 1408 04/20/15 1815 04/20/15 1849 Apr 22, 2015 0824  GLUCAP 46* 131* 102* 60* 154* 66     Imaging No results found.   ASSESSMENT / PLAN:  PULMONARY ETT 6/28 >> A:  Acute respiratory failure 2nd to hemorrhagic shock, encephalopathy, AKI, metabolic acidosis. Hx of asthma. P:   Full vent support  CARDIOVASCULAR Rt IJ CVL 6/28 >> Lt femoral HD cath 6/28 >> A line 6/28 >> A:  Hemorrhagic shock 2nd to hemoperitoneum. Septic shock. P:  Continue IV fluids Pressors as needed to keep MAP > 65  RENAL A:   AKI 2nd to hemorrhagic shock >> baseline creatine 0.76 from 04/19/2015. Refractory metabolic acidosis 2nd to lactic acidosis and renal failure. Rhabdomyolysis  Hyperkalemia. P:   CRRT initiated 6/29 per Renal  GASTROINTESTINAL A:   Acute Hepatic Failure >> s/p Lt hepatic lobe liver bx 6/28 for suspected cirrhosis.  Prelim biopsy does not show cirrhosis, more concerning for sickle cell anemia hepatopathy. Shock liver. Abdominal distention >> concern for bowel infarction >> not surgical candidate. Elevated lipase >> concern for acute pancreatitis. P:   Protonix for SUP  HEMATOLOGIC A:   Sickle cell crisis. Hemorrhagic shock 2nd to hemoperitoneum with consumption coagulopathy. P:  Monitor  INFECTIOUS A:   Septic shock >> likely intra-abdominal source. P:   Day 5 vancomycin, primaxin  BCx2 6/28 >> [facility] 6/29 >> Peritoneal fluid 7/01 >>  ENDOCRINE A:   Hypoglycemia in setting of shock, liver failure, renal failure. P:   D5NS at 38ml/hr with hypoglycemia  CBG q4h   NEUROLOGIC A:    Acute metabolic encephalopathy with elevate ammonia level 6/28. Chronic headaches. P:   Hold topamax for now   GOALS OF CARE I spoke with pt's parents and sister.  Explained that she is on maximal support.  Explained that her medical condition continues to get worse.  She has refractory acidosis, shock.  Her abdominal exam is worse, and she is not a surgical candidate.  Explained that she will most likely not survive.  They will consider this information and inform  medical team when they make a decision about how to proceed from here >> deciding about DNR status and whether to transition to comfort measures >> reviewed process for this with them.  CC time 35 minutes.  Coralyn Helling, MD [facility] Pulmonary/Critical Care 04/22/15, 9:57 AM Pager:  [phone] After 3pm call: [phone]   Family has decided to transition to comfort care.  Orders placed.  Coralyn Helling, MD [facility] Pulmonary/Critical Care 2015-04-22, 10:59 AM Pager:  [phone] After 3pm call: [phone]

## 2015-05-21 NOTE — Progress Notes (Signed)
Chaplain paged at 1033 to be with family and friends at the withdrawal of artificial care. Joan Martin was surrounded with family who were active in their care. A local minister came and prayed with the family. The chaplain was asked to pray for Joan Martin and for all assembled.   Joan Martin was surrounded by family and friends when she died. Parents took time after all others had departed to say their final good-byes. Joan Martin is to be cremated and this was the last time they would see her body.   The [employer] family asked the chaplain to pass on to the staff of [facility] their deep appreciation for the care and love provided to Joan Martin and for the nurses who provided support and care to them during this most difficult time.  Benjie Karvonenharles D. Irisa Grimsley, DMin Chaplain

## 2015-05-21 NOTE — Procedures (Signed)
Extubation Procedure Note  Patient Details:   Name: Joan Martin DOB: 04/10/1986 MRN: [id number]   Airway Documentation:     Evaluation  O2 sats: transiently fell during during procedure Complications: No apparent complications Patient did not tolerate procedure well. Bilateral Breath Sounds: Clear, Diminished Suctioning: Airway No  Joan Martin, Joan Martin 04/24/2015, 12:21 PM

## 2015-05-21 NOTE — Progress Notes (Signed)
eLink Physician-Brief Progress Note Patient Name: Joan Martin DOB: 10/30/1985 MRN: [id number]   Date of Service  04/28/2015  HPI/Events of Note  Refractory shock metab acidosis  eICU Interventions  Vaso gtt  add bicarb gtt     Intervention Category Major Interventions: Acid-Base disturbance - evaluation and management  ALVA,RAKESH V. 05/02/2015, 2:45 AM

## 2015-05-21 NOTE — Progress Notes (Signed)
This is a very difficult situation. 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 Her liver function tests continued to rise. Her lactic acid is 15. LDH is greater than 10,000. Thankfully, her renal function still appears to be okay. Her bilirubin is somewhat stable although incredibly elevated at 26.  Her hemoglobin is 7.5. Hopefully this is encouraging for her not to be bleeding. Her platelet count has dropped. I think this is all a reflection of her hepatic failure.  Her coags show an INR of 3.37. She has been getting quite a bit of blood product support and factor support.  She is on Neo-Synephrine and vasopressin for pressor support.  She still is on mechanical ventilation.  Looks like all of her cultures are negative.  She's getting fantastic care down in the ICU. I cannot think of any other intervention that can be done to try to improve her prognosis. I think it's a matter of whether or not her liver will improve. She has underlying cirrhosis by CT scan. 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 Because of this, even though her cultures are negative, I totally agree with the antibiotic coverage.  On her exam, her blood pressure is 125/42. She has a low-grade temperature of 99. Pulse is 112. Her arterial line blood pressure is 143/46. Her lungs show some decreased at the bases. Cardiac exam tachycardic but regular. Abdomen is somewhat distended.  We will continue to follow along. I will certainly pray hard for her.  Wendi SnipesPete E  Mark 11:24

## 2015-05-21 NOTE — Progress Notes (Signed)
RT removed ETT per MD order. Pt was a withdrawal of care. RN at bedside during withdrawal.

## 2015-05-21 DEATH — deceased

## 2015-05-25 ENCOUNTER — Ambulatory Visit: Payer: Medicaid Other | Admitting: Family Medicine

## 2015-05-30 ENCOUNTER — Ambulatory Visit: Payer: Medicaid Other | Admitting: Neurology

## 2017-06-11 IMAGING — DX DG CHEST 1V PORT
1 series · 1 of 1 positions shown · non-contrast
Comparison: 04/17/2015

CLINICAL DATA: Respiratory failure.  Endotracheal intubation.

EXAM:
PORTABLE CHEST - 1 VIEW

[chest ap]
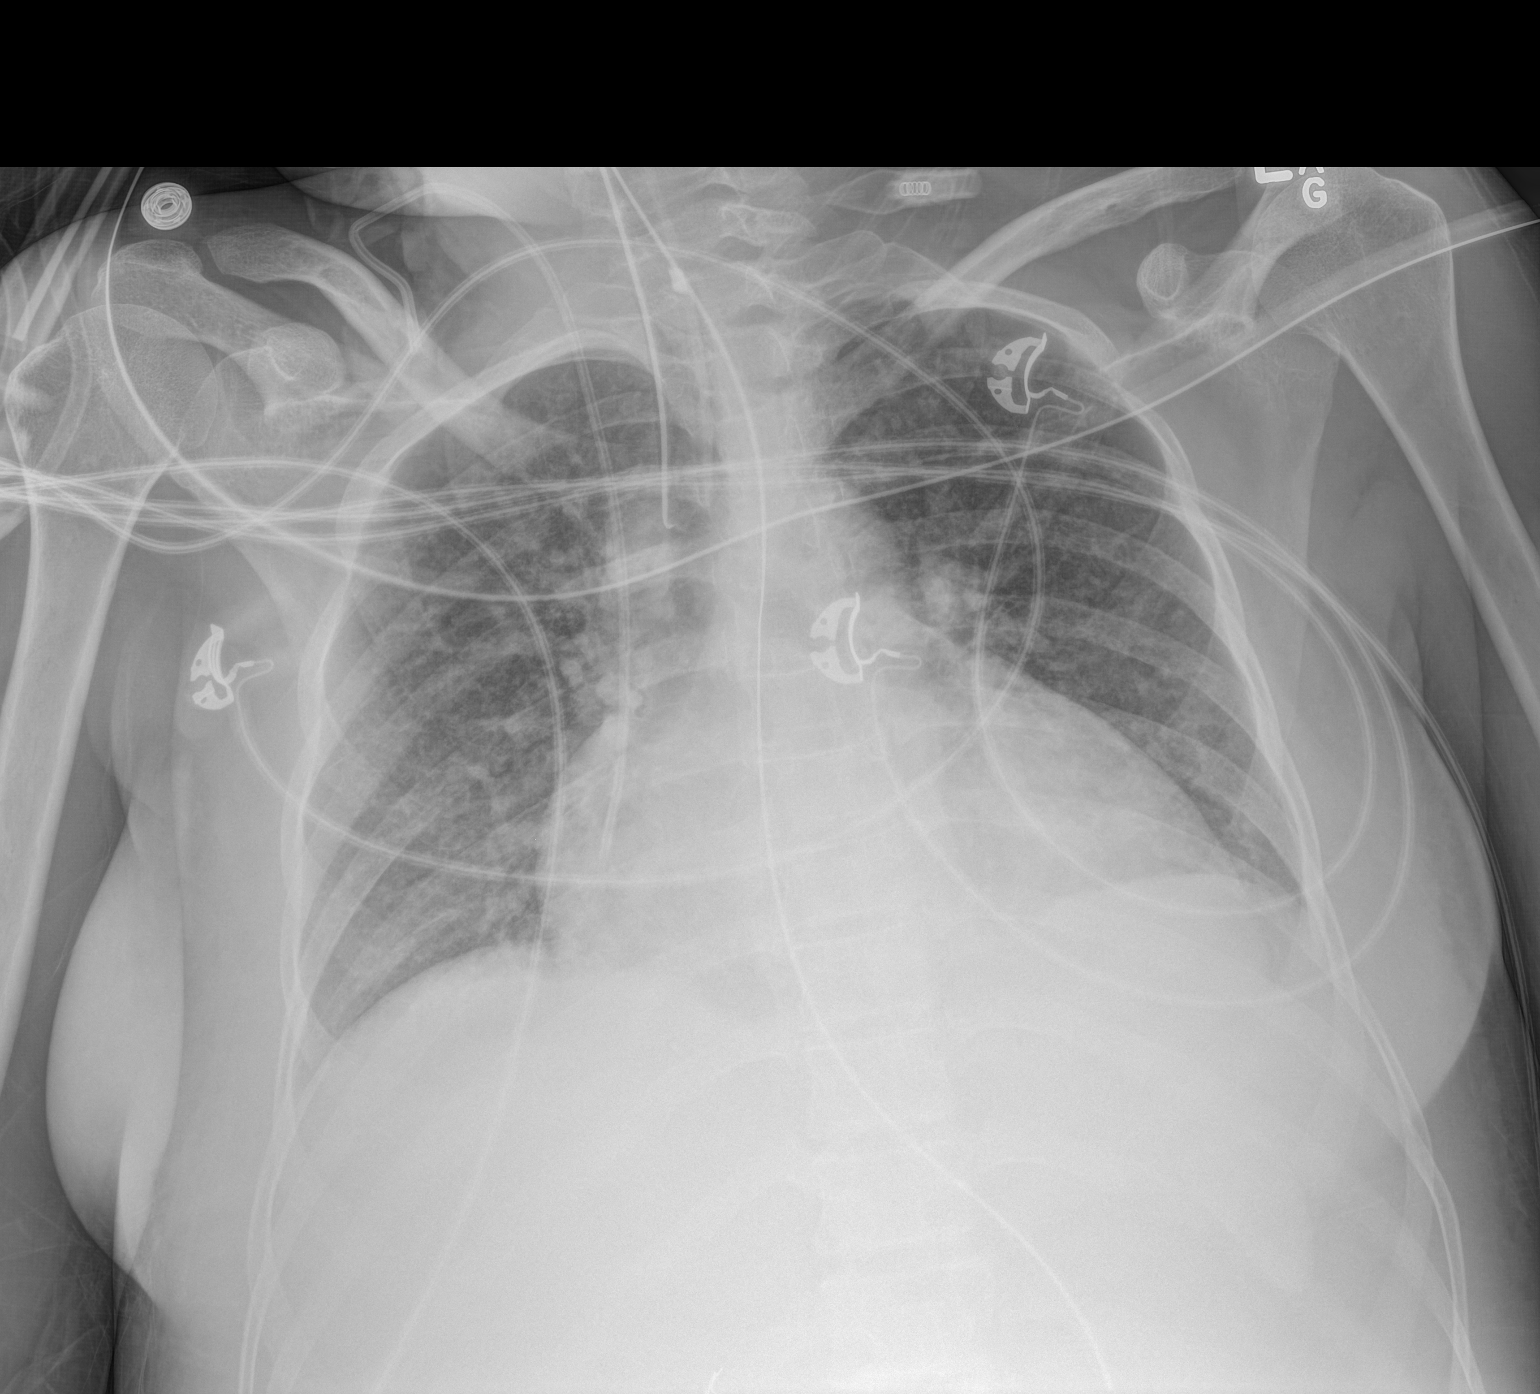

[1 of 1 positions shown; findings below may reference images not displayed]

FINDINGS: The endotracheal tube is 1.7 cm above the carina. The nasogastric
tube extends into the stomach. The right jugular central line
extends into the right atrium. There is no pneumothorax. There is no
large effusion. There is unchanged cardiomegaly. There is no
confluent airspace consolidation.
IMPRESSION: Support equipment appears satisfactorily positioned. The lungs are
grossly clear.
